# Patient Record
Sex: Female | Born: 1950
Health system: Southern US, Community
[De-identification: ages and names within clinical notes are randomized; demographics above are authoritative.]

## PROBLEM LIST (undated history)

## (undated) DIAGNOSIS — F329 Major depressive disorder, single episode, unspecified: Secondary | ICD-10-CM

## (undated) DIAGNOSIS — M810 Age-related osteoporosis without current pathological fracture: Secondary | ICD-10-CM

## (undated) DIAGNOSIS — E039 Hypothyroidism, unspecified: Secondary | ICD-10-CM

## (undated) DIAGNOSIS — K2100 Gastro-esophageal reflux disease with esophagitis, without bleeding: Secondary | ICD-10-CM

## (undated) DIAGNOSIS — N8111 Cystocele, midline: Secondary | ICD-10-CM

## (undated) DIAGNOSIS — F3289 Other specified depressive episodes: Secondary | ICD-10-CM

## (undated) DIAGNOSIS — G4700 Insomnia, unspecified: Secondary | ICD-10-CM

## (undated) DIAGNOSIS — K2270 Barrett's esophagus without dysplasia: Secondary | ICD-10-CM

## (undated) DIAGNOSIS — F411 Generalized anxiety disorder: Secondary | ICD-10-CM

## (undated) DIAGNOSIS — K120 Recurrent oral aphthae: Secondary | ICD-10-CM

## (undated) DIAGNOSIS — G4733 Obstructive sleep apnea (adult) (pediatric): Secondary | ICD-10-CM

## (undated) DIAGNOSIS — B159 Hepatitis A without hepatic coma: Secondary | ICD-10-CM

## (undated) DIAGNOSIS — A64 Unspecified sexually transmitted disease: Secondary | ICD-10-CM

## (undated) DIAGNOSIS — I10 Essential (primary) hypertension: Secondary | ICD-10-CM

## (undated) DIAGNOSIS — R81 Glycosuria: Secondary | ICD-10-CM

## (undated) DIAGNOSIS — R809 Proteinuria, unspecified: Secondary | ICD-10-CM

## (undated) DIAGNOSIS — L719 Rosacea, unspecified: Secondary | ICD-10-CM

## (undated) DIAGNOSIS — E785 Hyperlipidemia, unspecified: Secondary | ICD-10-CM

## (undated) DIAGNOSIS — Z79899 Other long term (current) drug therapy: Secondary | ICD-10-CM

## (undated) HISTORY — PX: ABDOMINOPLASTY: SUR9

## (undated) HISTORY — DX: Hypothyroidism, unspecified: E03.9

## (undated) HISTORY — DX: Barrett's esophagus without dysplasia: K22.70

## (undated) HISTORY — DX: Insomnia, unspecified: G47.00

## (undated) HISTORY — DX: Hyperlipidemia, unspecified: E78.5

## (undated) HISTORY — PX: OTHER SURGICAL HISTORY: SHX169

## (undated) HISTORY — DX: Generalized anxiety disorder: F41.1

## (undated) HISTORY — DX: Unspecified sexually transmitted disease: A64

## (undated) HISTORY — DX: Recurrent oral aphthae: K12.0

## (undated) HISTORY — DX: Other specified depressive episodes: F32.89

## (undated) HISTORY — DX: Glycosuria: R81

## (undated) HISTORY — DX: Hepatitis a without hepatic coma: B15.9

## (undated) HISTORY — DX: Obstructive sleep apnea (adult) (pediatric): G47.33

## (undated) HISTORY — DX: Essential (primary) hypertension: I10

## (undated) HISTORY — DX: Gastro-esophageal reflux disease with esophagitis: K21.0

## (undated) HISTORY — DX: Rosacea, unspecified: L71.9

## (undated) HISTORY — DX: Cystocele, midline: N81.11

## (undated) HISTORY — DX: Gastro-esophageal reflux disease with esophagitis, without bleeding: K21.00

## (undated) HISTORY — DX: Age-related osteoporosis without current pathological fracture: M81.0

## (undated) HISTORY — DX: Proteinuria, unspecified: R80.9

## (undated) HISTORY — DX: Major depressive disorder, single episode, unspecified: F32.9

## (undated) HISTORY — DX: Other long term (current) drug therapy: Z79.899

## (undated) HISTORY — PX: CATARACT EXTRACTION: SUR2

---

## 2003-06-01 ENCOUNTER — Ambulatory Visit (HOSPITAL_COMMUNITY): Admission: RE | Admit: 2003-06-01 | Discharge: 2003-06-01 | Payer: Self-pay | Admitting: *Deleted

## 2003-06-01 ENCOUNTER — Encounter (INDEPENDENT_AMBULATORY_CARE_PROVIDER_SITE_OTHER): Payer: Self-pay | Admitting: Specialist

## 2004-10-01 ENCOUNTER — Encounter (INDEPENDENT_AMBULATORY_CARE_PROVIDER_SITE_OTHER): Payer: Self-pay | Admitting: *Deleted

## 2004-10-01 ENCOUNTER — Ambulatory Visit (HOSPITAL_COMMUNITY): Admission: RE | Admit: 2004-10-01 | Discharge: 2004-10-01 | Payer: Self-pay | Admitting: *Deleted

## 2006-12-25 ENCOUNTER — Ambulatory Visit (HOSPITAL_COMMUNITY): Admission: RE | Admit: 2006-12-25 | Discharge: 2006-12-25 | Payer: Self-pay | Admitting: *Deleted

## 2006-12-25 ENCOUNTER — Encounter (INDEPENDENT_AMBULATORY_CARE_PROVIDER_SITE_OTHER): Payer: Self-pay | Admitting: Specialist

## 2007-12-30 HISTORY — PX: COSMETIC SURGERY: SHX468

## 2008-06-22 ENCOUNTER — Other Ambulatory Visit: Admission: RE | Admit: 2008-06-22 | Discharge: 2008-06-22 | Payer: Self-pay | Admitting: Obstetrics & Gynecology

## 2008-07-20 IMAGING — US MAMMO-RUNI-US
1 series · 5 of 5 positions shown · non-contrast
Comparison: NONE

CLINICAL DATA: J Louis Dugue, RT(R)(M)   Diagnostic 
Mammogram.  

RIGHT BREAST MAMMOGRAM ADDITIONAL VIEWS AND RIGHT BREAST 
ULTRASOUND

[Series 1: us breast · 0.07mm/px · 5 of 5 slices shown]
[im 1/5]
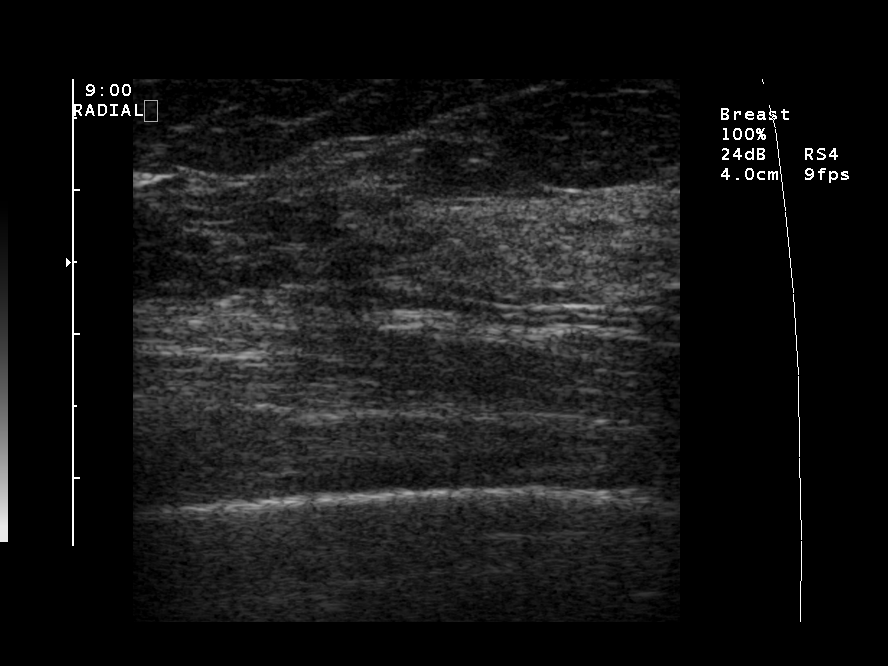
[im 2/5]
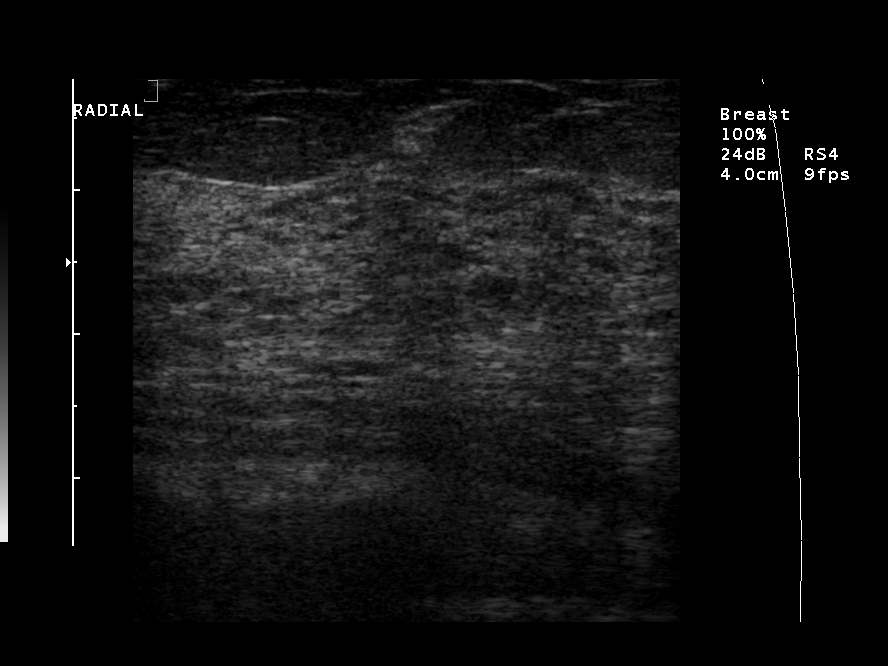
[im 3/5]
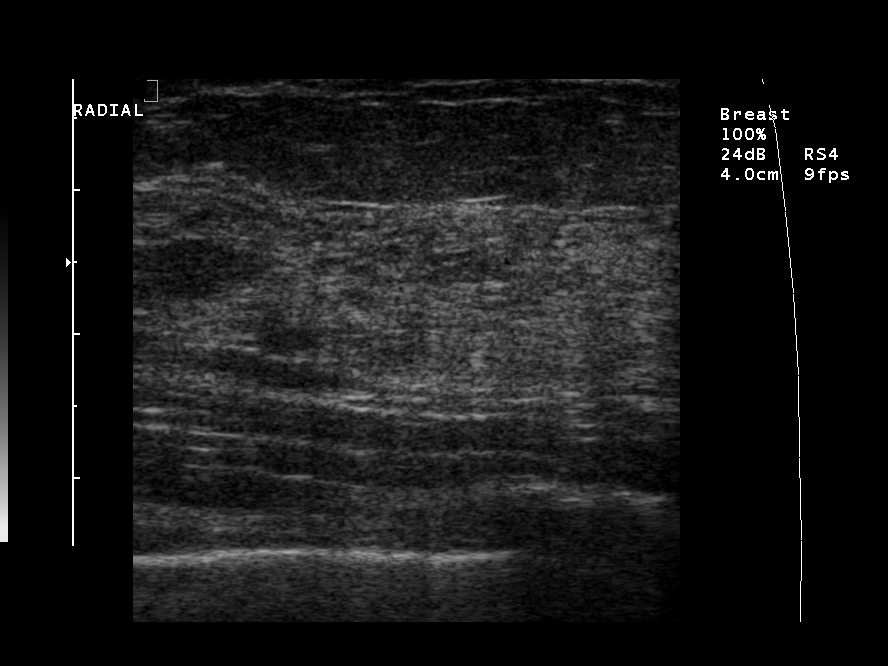
[im 4/5]
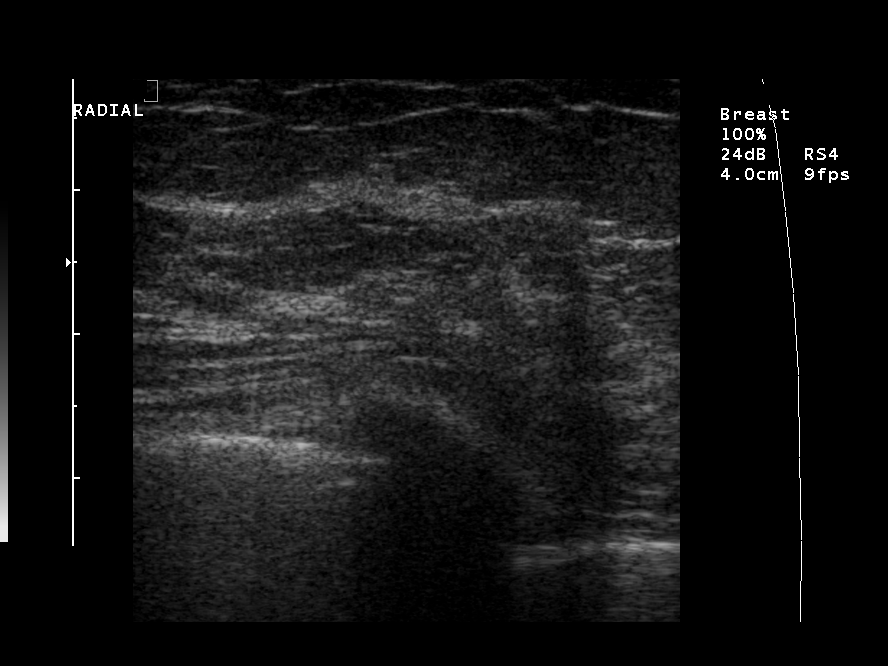
[im 5/5]
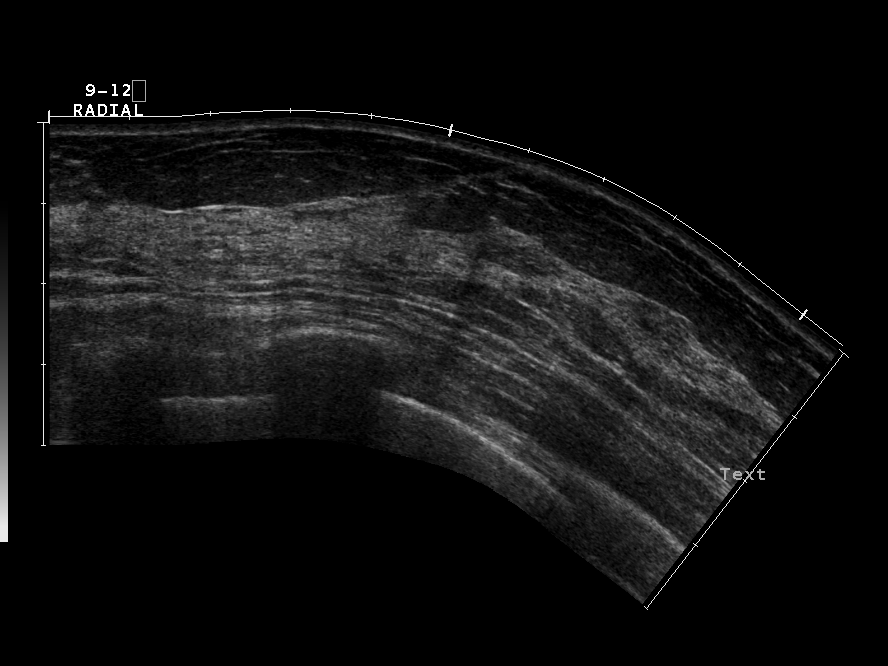

[5 of 5 positions shown; findings below may reference images not displayed]

FINDINGS: Exaggerated lateral right CC and repeat right CC view 
demonstrates fibroglandular asymmetry without definite 
architectural distortion, spiculation, or microcalcification.  
Ultrasound was performed for evaluation of area of fibroglandular 
asymmetry and demonstrates no suspicious findings.
IMPRESSION: No mammographic or ultrasonographic evidence of 
malignancy.  Bilateral mammogram in one year is recommended. The 
patient was informed at the time of the examination of the 
findings and recommendations by verbal and written lay report. 
Computer assisted (Second Look) technology was used as an aid in 
interpretation of this study. BI-RADS 2- Benign Mancera, Khristina Date: 12/28/2006 MOE GRACIANO

## 2008-12-29 HISTORY — PX: BREAST ENHANCEMENT SURGERY: SHX7

## 2009-04-20 IMAGING — US US-BREAST([ID])
1 series · 14 of 14 positions shown · non-contrast
Comparison: NONE

CLINICAL DATA: Dedekind, Malamba   Diagnostic Mammogram. 

BILATERAL MAMMOGRAM AND BILATERAL BREAST ULTRASOUND

[Series 1: us breast · 0.07mm/px · 14 of 14 slices shown]
[im 1/14]
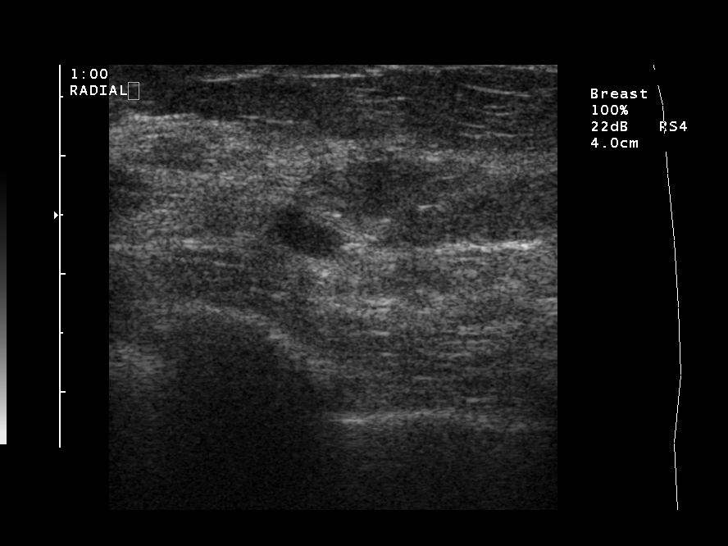
[im 2/14]
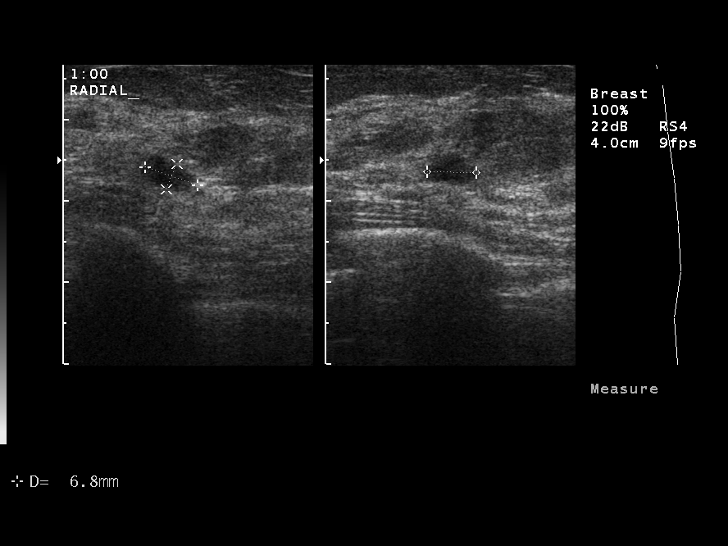
[im 3/14]
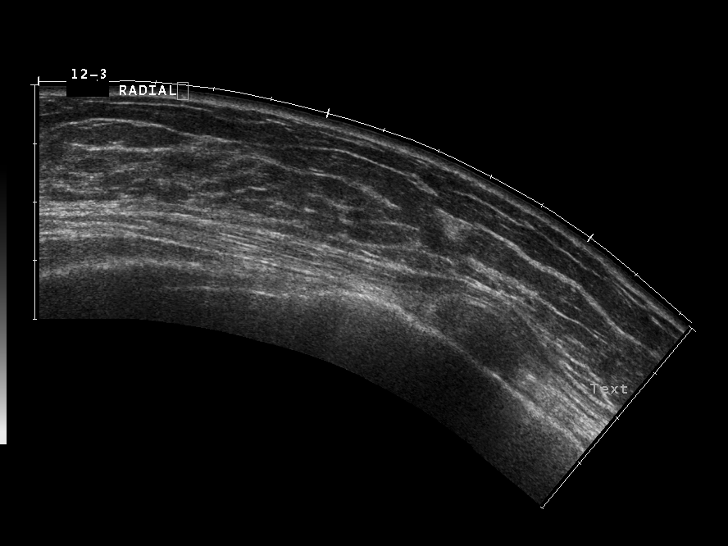
[im 4/14]
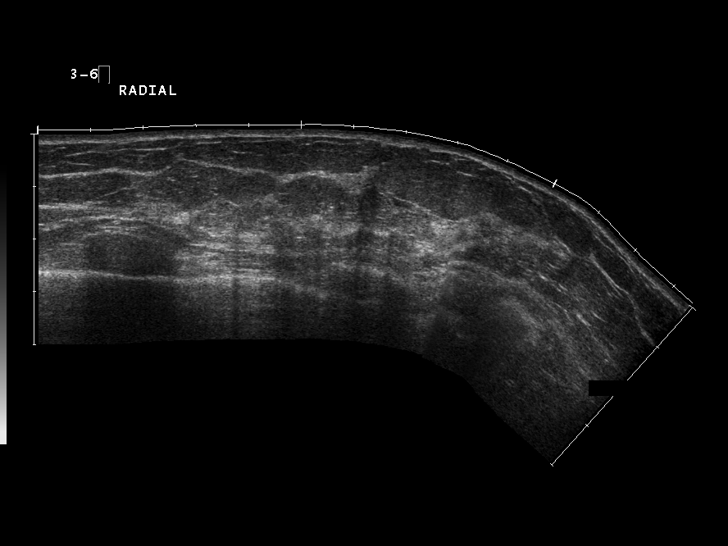
[im 5/14]
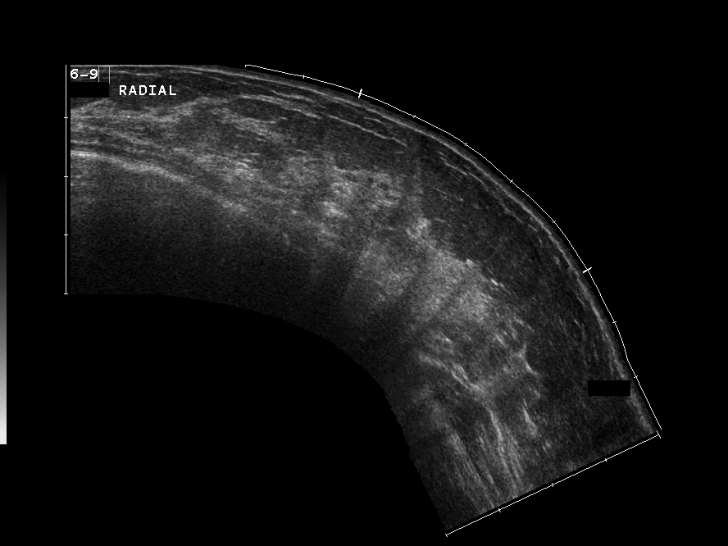
[im 6/14]
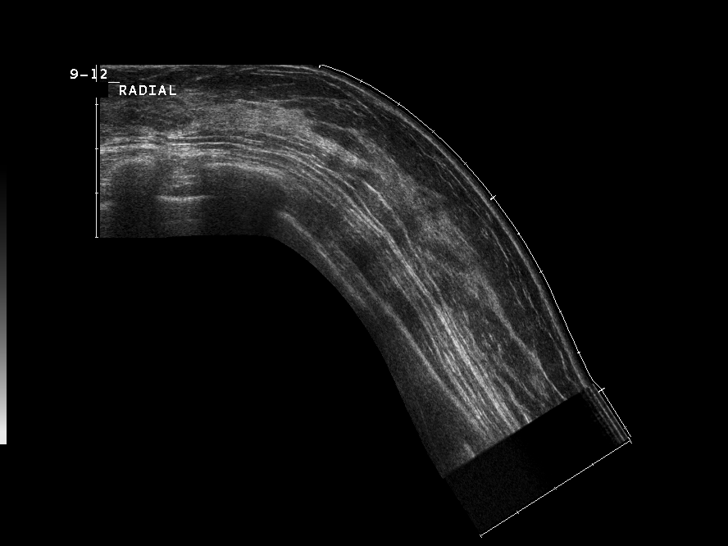
[im 7/14]
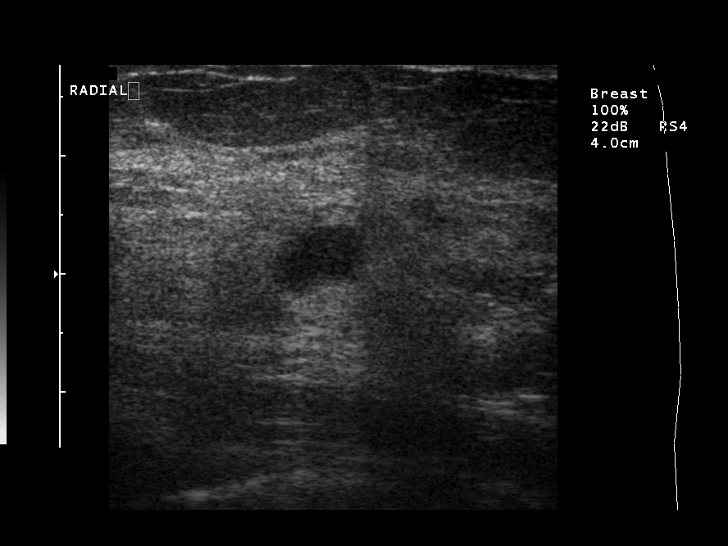
[im 8/14]
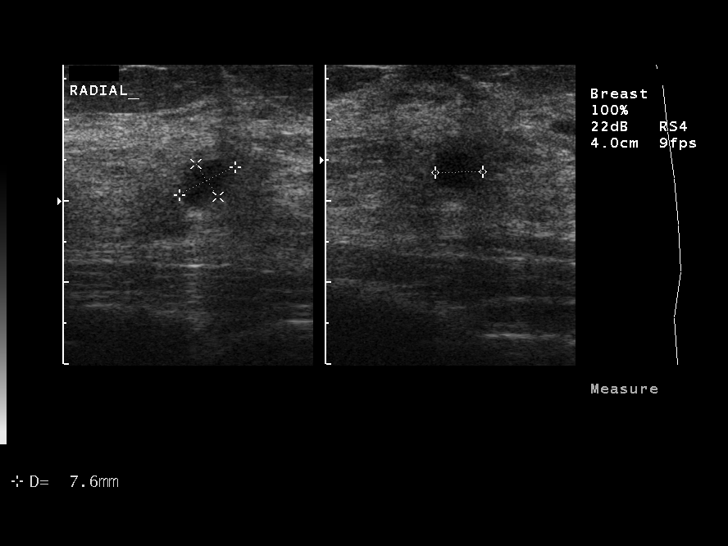
[im 9/14]
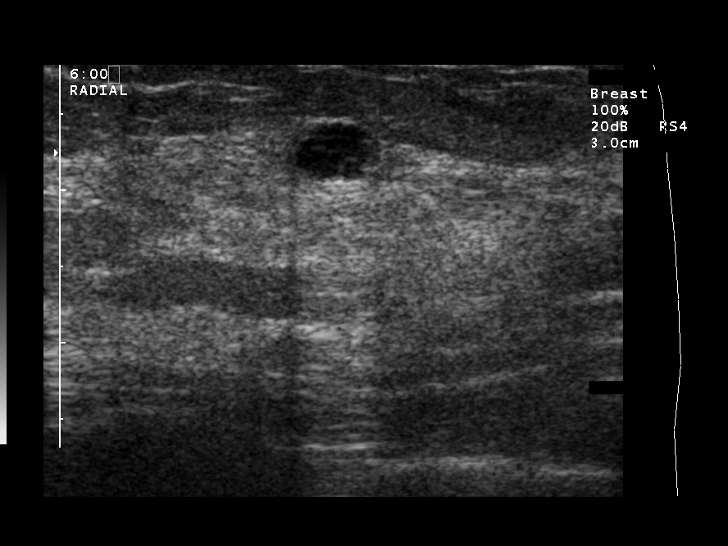
[im 10/14]
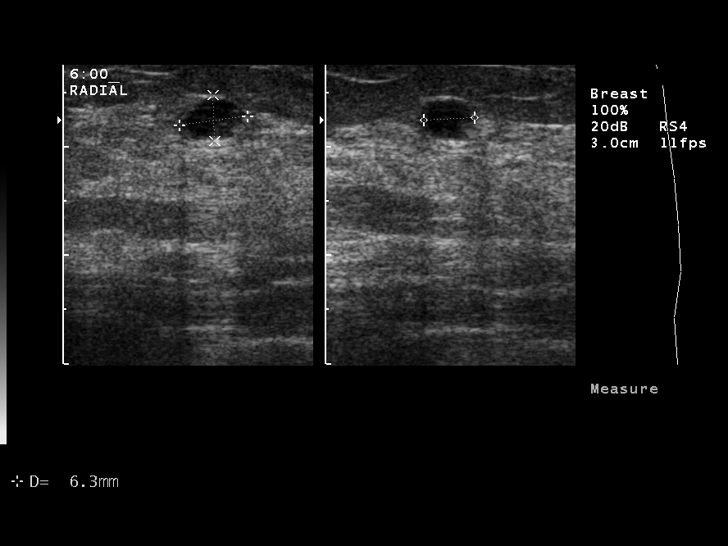
[im 11/14]
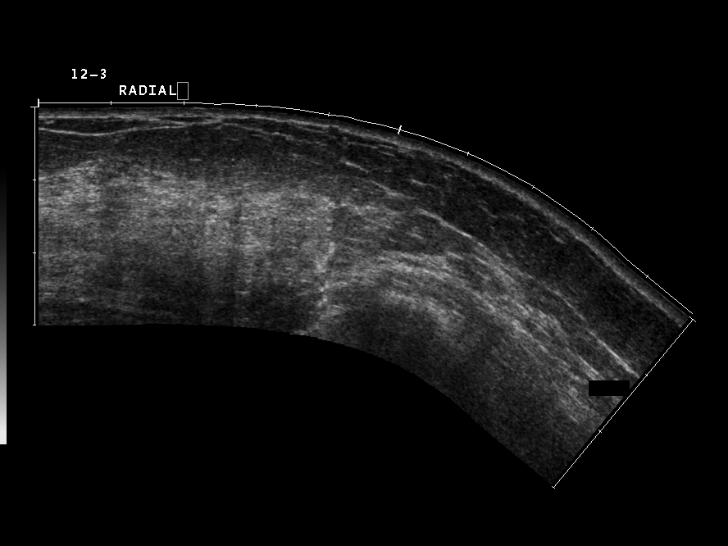
[im 12/14]
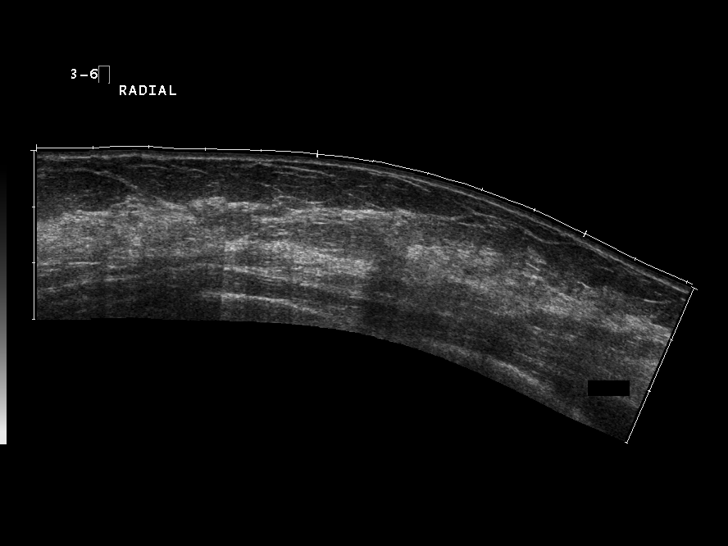
[im 13/14]
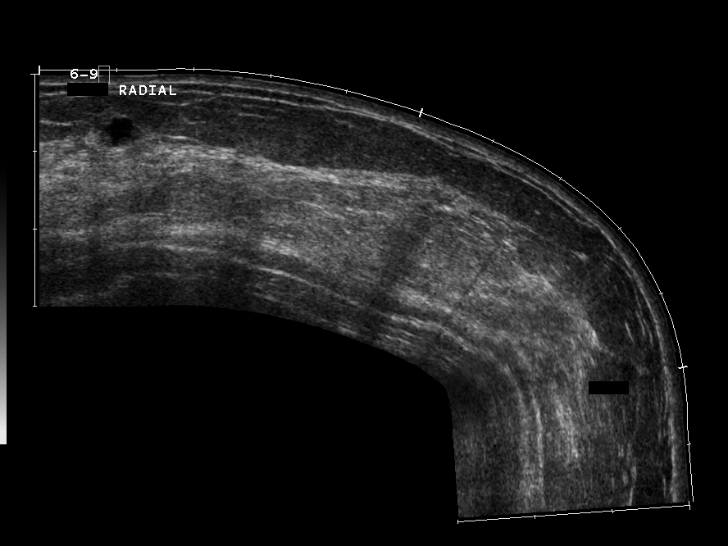
[im 14/14]
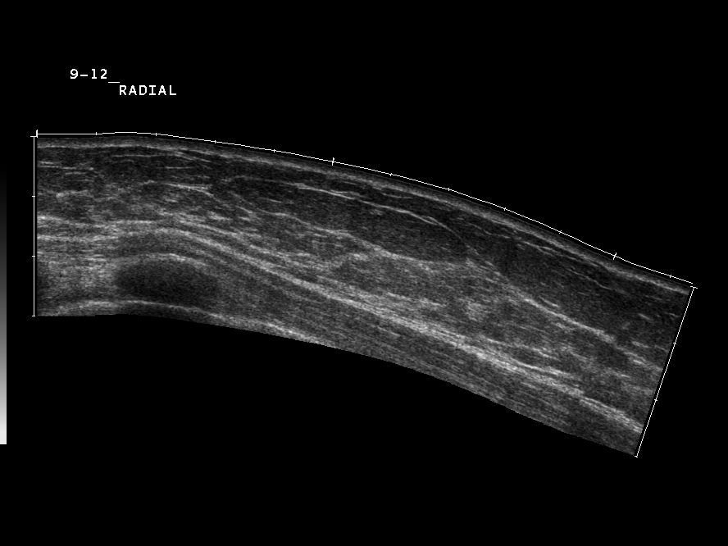

[14 of 14 positions shown; findings below may reference images not displayed]

FINDINGS: Tissue density is moderate.  Comparison with 11-11-05 
and 12-18-06 studies.  No dominant mass or microcalcification is 
identified to suggest malignancy.  The appearance is stable. 
Bilateral breast ultrasound was performed.  This is correlated 
with previous ultrasound dated 04-21-05.  At 6 o???clock in the left 
breast, there is a cyst basically unchanged since the previous 
exam.  At 12 o???clock there is a focal area of decreased 
echogenicity with sound transmission likely representing a benign 
lesion such as a cyst as well.  No masses characteristic of a 
carcinoma are identified.
IMPRESSION: Benign finding.  Recommend follow-up screening 
mammography in one year. The patient was informed at the time of 
the examination of the findings and recommendations by verbal and 
written lay report. Computer assisted (Second Look) technology was 
used as an aid in interpretation of this study. BI-RADS 2- Benign

## 2009-12-29 HISTORY — PX: GANGLION CYST EXCISION: SHX1691

## 2011-01-24 ENCOUNTER — Other Ambulatory Visit: Payer: Self-pay

## 2011-05-16 NOTE — Op Note (Signed)
   NAME:  Donna Ware, CONNERY                   ACCOUNT NO.:  [id number]   MEDICAL RECORD NO.:  [id number]                   PATIENT TYPE:  AMB   LOCATION:  ENDO                                 FACILITY:  [facility]   PHYSICIAN:  Georgiana Spinner, M.D.                 DATE OF BIRTH:  May 31, 1951   DATE OF PROCEDURE:  06/01/2003  DATE OF DISCHARGE:                                 OPERATIVE REPORT   PROCEDURE:  Upper endoscopy.   ENDOSCOPIST:  Georgiana Spinner, M.D.   INDICATIONS FOR PROCEDURE:  Gastroesophageal reflux disease.   ANESTHESIA:  Demerol 100 mg, Versed 10 mg.   DESCRIPTION OF PROCEDURE:  With the patient mildly sedated in the left  lateral decubitus position, the Olympus videoscopic endoscope was inserted  in the mouth and passed under direct vision through the esophagus and into  the stomach.  The fundus, body, antrum, duodenal bulb and second portion of  the duodenum all appeared normal.  From this point the endoscope was slowly  withdrawn, taking circumferential views of the duodenal mucosa.  The  endoscope was pulled back into the stomach and placed on retroflexion to  view the stomach from below.  The endoscope was then straightened and  withdrawn, taking circumferential views of the remaining gastric and  esophageal mucosa, stopping in the area of the distal esophagus, where what  appeared to be changes of Barrett's esophagus were seen, photographed and  biopsied.  The patient's vital signs and pulse oximeter remained stable.  The patient tolerated the procedure well without apparent complications.   FINDINGS:  Changes of what appears to be Barrett's esophagus distally in the  esophagus, biopsied.  Await the biopsy report.   DISPOSITION:  The patient will call me for the results and will follow up  with me as an outpatient.                                                Georgiana Spinner, M.D.    GMO/MEDQ  D:  06/01/2003  T:  06/01/2003  Job:  [id number]

## 2011-05-16 NOTE — Op Note (Signed)
NAMELILA, LUFKIN NO.:  [id number]   MEDICAL RECORD NO.:  [id number]          PATIENT TYPE:  AMB   LOCATION:  ENDO                         FACILITY:  [facility]   PHYSICIAN:  Georgiana Spinner, M.D.    DATE OF BIRTH:  09/11/1951   DATE OF PROCEDURE:  10/01/2004  DATE OF DISCHARGE:                                 OPERATIVE REPORT   PROCEDURE PERFORMED:  Upper endoscopy with biopsy.   ENDOSCOPIST:  Georgiana Spinner, M.D.   INDICATIONS FOR PROCEDURE:  Genella Rife.   ANESTHESIA:  Demerol 100 mg, Versed 10 mg.   DESCRIPTION OF PROCEDURE:  With the patient mildly sedated in the left  lateral decubitus position, the Olympus video endoscope was inserted in the  mouth and passed under direct vision through the esophagus which appeared  normal until we reached the distal esophagus and there were changes of  Barrett's photographed and biopsied.  We entered into the stomach.  In the  fundus there were some remnants of food which were photographed along with  the fundus, but body, antrum, duodenal bulb and second portion of the  duodenum all appeared normal.  From this point, the endoscope was slowly  withdrawn taking circumferential views of the duodenal mucosa until the  endoscope was pulled back into the stomach and placed on retroflexion to  view the stomach from below.  The endoscope was then straightened and  withdrawn taking circumferential views of the remaining gastric and  esophageal mucosa.  The patient's vital signs and pulse oximeter remained  stable.  The patient tolerated the procedure well without apparent  complications.   FINDINGS:  Barrett's esophagus.  Loose wrap of the gastroesophageal junction  around the endoscope, photographed.  Some residual food left in the stomach  indicating possible mild gastroparesis.   PLAN:  Await biopsy report.  The patient will call me for results and follow  up with me as an outpatient.       GMO/MEDQ  D:  10/01/2004  T:   10/01/2004  Job:  [id number]

## 2011-05-16 NOTE — Op Note (Signed)
NAMEJILLANN, CHARETTE NO.:  [id number]   MEDICAL RECORD NO.:  [id number]          PATIENT TYPE:  AMB   LOCATION:  ENDO                         FACILITY:  [facility]   PHYSICIAN:  Georgiana Spinner, M.D.    DATE OF BIRTH:  07-07-1951   DATE OF PROCEDURE:  12/25/2006  DATE OF DISCHARGE:                               OPERATIVE REPORT   PROCEDURE:  Upper endoscopy with biopsy.   INDICATIONS:  GERD.  Question of Barrett's esophagus.   ANESTHESIA:  Demerol 80 mg, Versed 8 mg, Phenergan 25 mg.   PROCEDURE:  With the patient mildly sedated in the left lateral  decubitus position the Pentax videoscopic endoscope was inserted in the  mouth and passed under direct vision through the esophagus which did not  fully open so we could not really get good view of the distal esophagus  to see if there was truly Barrett's endoscopically, but we elected to  biopsy around the circumference of the squamocolumnar junction.  We then  entered into the stomach. Fundus, body, antrum, duodenal bulb, second  portion duodenum were visualized. From this point the endoscope was  slowly withdrawn taking circumferential views of duodenal mucosa until  the endoscope had been pulled back into the stomach, placed in  retroflexion to view the stomach from below. The endoscope was  straightened and withdrawn taking circumferential views remaining  gastric and esophageal mucosa.  The patient's vital signs, pulse  oximeter remained stable.  The patient tolerated procedure well without  apparent complication.   FINDINGS:  Unremarkable examination with question of Barrett's esophagus  biopsied.  Await biopsy report.  The patient will call me for results  and follow-up with me as an outpatient.           ______________________________  Georgiana Spinner, M.D.     GMO/MEDQ  D:  12/25/2006  T:  12/25/2006  Job:  [id number]

## 2011-05-16 NOTE — Op Note (Signed)
   NAME:  Donna Ware, Donna Ware                   ACCOUNT NO.:  [id number]   MEDICAL RECORD NO.:  [id number]                   PATIENT TYPE:  AMB   LOCATION:  ENDO                                 FACILITY:  [facility]   PHYSICIAN:  Georgiana Spinner, M.D.                 DATE OF BIRTH:  1951-09-19   DATE OF PROCEDURE:  06/01/2003  DATE OF DISCHARGE:                                 OPERATIVE REPORT   PROCEDURE:  Colonoscopy.   ENDOSCOPIST:  Georgiana Spinner, M.D.   INDICATIONS:  Colon cancer screening, Hemoccult positivity.   ANESTHESIA:  None further given.   DESCRIPTION OF PROCEDURE:  With the patient mildly sedated in the left  lateral decubitus position, the Olympus videoscopic colonoscope was inserted  in the rectum and passed under direct vision to the cecum, identified by the  cecum and the ileocecal valve, both which were photographed.  From this  point, the colonoscope was slowly withdrawn taken circumferential views of  the entire colonic mucosa visualized, stopping in the rectum which appeared  normal on direct view and showed unremarkable on retroflexed view as well.  The endoscope was straightened and withdrawn.  The patient's vital signs and  pulse oximeter remained stable.  The patient tolerated the procedure well  without apparent complications.   FINDINGS:  Unremarkable colonoscopic examination to the cecum.   PLAN:  Possibly repeat examination in 5-10 years.                                               Georgiana Spinner, M.D.    GMO/MEDQ  D:  06/01/2003  T:  06/01/2003  Job:  [id number]   cc:   Lenon Curt. Chilton Si, M.D.  [address]  [place]  [place] [address]  Fax: [phone]

## 2013-06-08 ENCOUNTER — Encounter: Payer: Self-pay | Admitting: *Deleted

## 2013-06-09 ENCOUNTER — Ambulatory Visit (INDEPENDENT_AMBULATORY_CARE_PROVIDER_SITE_OTHER): Payer: Federal, State, Local not specified - PPO | Admitting: Internal Medicine

## 2013-06-09 ENCOUNTER — Other Ambulatory Visit: Payer: Self-pay | Admitting: *Deleted

## 2013-06-09 ENCOUNTER — Other Ambulatory Visit: Payer: Self-pay | Admitting: Internal Medicine

## 2013-06-09 ENCOUNTER — Encounter: Payer: Self-pay | Admitting: Internal Medicine

## 2013-06-09 VITALS — BP 128/82 | HR 63 | Temp 97.3°F | Resp 14 | Ht 65.0 in | Wt 123.4 lb

## 2013-06-09 DIAGNOSIS — G4733 Obstructive sleep apnea (adult) (pediatric): Secondary | ICD-10-CM | POA: Insufficient documentation

## 2013-06-09 DIAGNOSIS — F3289 Other specified depressive episodes: Secondary | ICD-10-CM | POA: Insufficient documentation

## 2013-06-09 DIAGNOSIS — F329 Major depressive disorder, single episode, unspecified: Secondary | ICD-10-CM

## 2013-06-09 DIAGNOSIS — G4700 Insomnia, unspecified: Secondary | ICD-10-CM | POA: Insufficient documentation

## 2013-06-09 DIAGNOSIS — Z0000 Encounter for general adult medical examination without abnormal findings: Secondary | ICD-10-CM | POA: Insufficient documentation

## 2013-06-09 DIAGNOSIS — K2270 Barrett's esophagus without dysplasia: Secondary | ICD-10-CM | POA: Insufficient documentation

## 2013-06-09 DIAGNOSIS — Z9189 Other specified personal risk factors, not elsewhere classified: Secondary | ICD-10-CM

## 2013-06-09 DIAGNOSIS — I10 Essential (primary) hypertension: Secondary | ICD-10-CM | POA: Insufficient documentation

## 2013-06-09 DIAGNOSIS — M810 Age-related osteoporosis without current pathological fracture: Secondary | ICD-10-CM

## 2013-06-09 DIAGNOSIS — K2100 Gastro-esophageal reflux disease with esophagitis, without bleeding: Secondary | ICD-10-CM

## 2013-06-09 DIAGNOSIS — Z202 Contact with and (suspected) exposure to infections with a predominantly sexual mode of transmission: Secondary | ICD-10-CM | POA: Insufficient documentation

## 2013-06-09 DIAGNOSIS — E039 Hypothyroidism, unspecified: Secondary | ICD-10-CM

## 2013-06-09 DIAGNOSIS — E785 Hyperlipidemia, unspecified: Secondary | ICD-10-CM | POA: Insufficient documentation

## 2013-06-09 MED ORDER — OMEPRAZOLE 20 MG PO CPDR: DELAYED_RELEASE_CAPSULE | ORAL | Status: DC

## 2013-06-09 MED ORDER — THYROID 60 MG PO TABS: ORAL_TABLET | ORAL | Status: DC

## 2013-06-09 MED ORDER — CLONAZEPAM 1 MG PO TABS: ORAL_TABLET | ORAL | Status: DC

## 2013-06-09 MED ORDER — ZOLPIDEM TARTRATE 10 MG PO TABS: 10.0000 mg | ORAL_TABLET | Freq: Every evening | ORAL | Status: DC | PRN

## 2013-06-09 MED ORDER — FLUOXETINE HCL 10 MG PO CAPS: ORAL_CAPSULE | ORAL | Status: DC

## 2013-06-09 NOTE — Assessment & Plan Note (Signed)
Annual pap smear, pelvic, rectal and breast exam performed.  Referrals done for mammogram and bone density at [facility] where she previously has had them.
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
## 2013-06-09 NOTE — Assessment & Plan Note (Signed)
Stable

## 2013-06-09 NOTE — Assessment & Plan Note (Signed)
Check FLP today.  Not on meds for this.

## 2013-06-09 NOTE — Assessment & Plan Note (Signed)
Mood is amazingly good considering her circumstances.  Has h/o bulimia.  Weight appears normal at present.  Does have some nail changes that suggest vitamin deficiency.  I have ordered her vitamin D level today, but others could be responsible, and could be related to her prior difficulties or her avoidance of high vitamin C products due to her acid reflux.

## 2013-06-09 NOTE — Assessment & Plan Note (Signed)
Follows with Dr. Virginia Rochester.

## 2013-06-09 NOTE — Assessment & Plan Note (Signed)
BP is at goal. 

## 2013-06-09 NOTE — Patient Instructions (Addendum)
Please use condoms with any sexual encounters.  We will call you with results of your testing.

## 2013-06-09 NOTE — Assessment & Plan Note (Signed)
Stable, avoids acidic foods.

## 2013-06-09 NOTE — Assessment & Plan Note (Signed)
Check TSH today and continue armour thyroid for now.  I am unsure if endocrine started this or if Dr. Chilton Si had prescribed it.

## 2013-06-09 NOTE — Assessment & Plan Note (Signed)
Is due for her bone density at [facility].  Referral done today.  She is on calcium with vitamin D.  She has Barrett's esophagus so bisphosphonates would be contraindicated.  I am unsure if she has received other treatment for this and we will need to address this at her next regular visit.  May benefit from IV bisphosphonate or prolia.
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
## 2013-06-09 NOTE — Assessment & Plan Note (Signed)
Stable at this time 

## 2013-06-11 LAB — COMPREHENSIVE METABOLIC PANEL
ALT: 15 IU/L (ref 0–32)
AST: 19 IU/L (ref 0–40)
Albumin/Globulin Ratio: 1.8 (ref 1.1–2.5)
Albumin: 4.6 g/dL (ref 3.6–4.8)
Alkaline Phosphatase: 66 IU/L (ref 39–117)
BUN/Creatinine Ratio: 21 (ref 11–26)
BUN: 15 mg/dL (ref 8–27)
CO2: 27 mmol/L (ref 19–28)
Calcium: 9.4 mg/dL (ref 8.6–10.2)
Chloride: 103 mmol/L (ref 97–108)
Creatinine, Ser: 0.71 mg/dL (ref 0.57–1.00)
GFR calc Af Amer: 106 mL/min/{1.73_m2} (ref >59)
GFR calc non Af Amer: 92 mL/min/{1.73_m2} (ref >59)
Globulin, Total: 2.6 g/dL (ref 1.5–4.5)
Glucose: 98 mg/dL (ref 65–99)
Potassium: 4 mmol/L (ref 3.5–5.2)
Sodium: 141 mmol/L (ref 134–144)
Total Bilirubin: 0.8 mg/dL (ref 0.0–1.2)
Total Protein: 7.2 g/dL (ref 6.0–8.5)

## 2013-06-11 LAB — CBC WITH DIFFERENTIAL/PLATELET
Basophils Absolute: 0 10*3/uL (ref 0.0–0.2)
Basos: 1 % (ref 0–3)
Eos: 1 % (ref 0–5)
Eosinophils Absolute: 0 10*3/uL (ref 0.0–0.4)
HCT: 38.9 % (ref 34.0–46.6)
Hemoglobin: 13.6 g/dL (ref 11.1–15.9)
Immature Grans (Abs): 0 10*3/uL (ref 0.0–0.1)
Immature Granulocytes: 0 % (ref 0–2)
Lymphocytes Absolute: 1 10*3/uL (ref 0.7–3.1)
Lymphs: 33 % (ref 14–46)
MCH: 32.2 pg (ref 26.6–33.0)
MCHC: 35 g/dL (ref 31.5–35.7)
MCV: 92 fL (ref 79–97)
Monocytes Absolute: 0.4 10*3/uL (ref 0.1–0.9)
Monocytes: 14 % — ABNORMAL HIGH (ref 4–12)
Neutrophils Absolute: 1.6 10*3/uL (ref 1.4–7.0)
Neutrophils Relative %: 51 % (ref 40–74)
RBC: 4.22 x10E6/uL (ref 3.77–5.28)
RDW: 13.1 % (ref 12.3–15.4)
WBC: 3.1 10*3/uL — ABNORMAL LOW (ref 3.4–10.8)

## 2013-06-11 LAB — LIPID PANEL WITH LDL/HDL RATIO
Cholesterol, Total: 228 mg/dL — ABNORMAL HIGH (ref 100–199)
HDL: 78 mg/dL (ref >39)
LDL Calculated: 139 mg/dL — ABNORMAL HIGH (ref 0–99)
LDl/HDL Ratio: 1.8 ratio units (ref 0.0–3.2)
Triglycerides: 55 mg/dL (ref 0–149)
VLDL Cholesterol Cal: 11 mg/dL (ref 5–40)

## 2013-06-11 LAB — GC/CHLAMYDIA PROBE AMP
Chlamydia trachomatis, NAA: NEGATIVE
Neisseria gonorrhoeae by PCR: NEGATIVE

## 2013-06-11 LAB — HSV(HERPES SIMPLEX VRS) I + II AB-IGG
HAV 1 IGG,TYPE SPECIFIC AB: <0.91 index (ref 0.00–0.90)
HSV 2 IGG,TYPE SPECIFIC AB: 2.64 index — ABNORMAL HIGH (ref 0.00–0.90)

## 2013-06-11 LAB — HEPATITIS PANEL, ACUTE
Hep A IgM: NEGATIVE
Hep B C IgM: NEGATIVE
Hep C Virus Ab: 0.7 s/co ratio (ref 0.0–0.9)
Hepatitis B Surface Ag: NEGATIVE

## 2013-06-11 LAB — TSH: TSH: 2.6 u[IU]/mL (ref 0.450–4.500)

## 2013-06-11 LAB — VITAMIN D 25 HYDROXY (VIT D DEFICIENCY, FRACTURES): Vit D, 25-Hydroxy: 27.5 ng/mL — ABNORMAL LOW (ref 30.0–100.0)

## 2013-06-16 ENCOUNTER — Other Ambulatory Visit: Payer: Self-pay | Admitting: Geriatric Medicine

## 2013-06-16 MED ORDER — ZOLPIDEM TARTRATE 10 MG PO TABS: 10.0000 mg | ORAL_TABLET | Freq: Every evening | ORAL | Status: DC | PRN

## 2013-07-13 ENCOUNTER — Other Ambulatory Visit: Payer: Self-pay | Admitting: Internal Medicine

## 2013-07-19 ENCOUNTER — Other Ambulatory Visit: Payer: Self-pay | Admitting: Geriatric Medicine

## 2013-07-19 MED ORDER — CLONAZEPAM 1 MG PO TABS: ORAL_TABLET | ORAL | Status: DC

## 2013-07-25 ENCOUNTER — Other Ambulatory Visit: Payer: Self-pay | Admitting: Geriatric Medicine

## 2013-07-25 MED ORDER — ZOLPIDEM TARTRATE 10 MG PO TABS: 10.0000 mg | ORAL_TABLET | Freq: Every evening | ORAL | Status: DC | PRN

## 2013-07-28 ENCOUNTER — Ambulatory Visit: Payer: Self-pay | Admitting: Certified Nurse Midwife

## 2013-07-28 LAB — PAP IG (IMAGE GUIDED): PAP Smear Comment: 0

## 2013-07-30 LAB — SPECIMEN STATUS REPORT

## 2013-08-13 ENCOUNTER — Other Ambulatory Visit: Payer: Self-pay | Admitting: Nurse Practitioner

## 2013-08-19 ENCOUNTER — Encounter: Payer: Self-pay | Admitting: Geriatric Medicine

## 2013-09-01 ENCOUNTER — Telehealth: Payer: Self-pay

## 2013-09-01 NOTE — Telephone Encounter (Signed)
Spoke with pharmacist at [facility] [place], [place]. Patient has refills on file, PA is required per insurance company. PA request was faxed on in August 2014. I requested PA to be re-faxed to [phone]

## 2013-09-01 NOTE — Telephone Encounter (Signed)
Message left on triage voicemail: patient is going through a divorce and is temporarily living in [place]. Patient will move back her in October. [facility] [place], [place]   Last OV 06/09/2013, last filled 07/25/2012 #30/3 refills. Left message on voicemail for pharmacy to call and verify if rx is on file from 07/25/2013.

## 2013-09-02 NOTE — Telephone Encounter (Signed)
No PA paperwork received as of 09/02/2013.  I called and left message on voicemail for pharmacy requesting paperwork be submitted for PA to our office, I requested that PA paperwork be sent to my attention

## 2013-09-02 NOTE — Telephone Encounter (Signed)
Mychart message sent to patient to explain status on refill request for ambien, patient instructed to further follow-up with pharmacy for I have contacted them on several times and still no PA paperwork received.

## 2013-09-12 ENCOUNTER — Other Ambulatory Visit: Payer: Self-pay | Admitting: Nurse Practitioner

## 2013-09-12 NOTE — Telephone Encounter (Signed)
Patient cancelled AEX 07/28/13 please advise no AEX currently scheduled.

## 2013-09-13 ENCOUNTER — Other Ambulatory Visit: Payer: Self-pay | Admitting: *Deleted

## 2013-09-13 MED ORDER — CLONAZEPAM 1 MG PO TABS: ORAL_TABLET | ORAL | Status: DC

## 2013-09-20 ENCOUNTER — Other Ambulatory Visit: Payer: Self-pay | Admitting: Nurse Practitioner

## 2013-09-21 NOTE — Telephone Encounter (Signed)
Called pharmacy x 2, line busy. I will try to reach pharmacy again later. RX was filled on 09/13/2013 #120/1 refill-? If received by pharmacy

## 2013-09-21 NOTE — Telephone Encounter (Signed)
eScribe request for refill on ESTRACE VAG CREAM Last filled - 09/15/13 Last AEX - 07/27/12 Next AEX - not scheduled Pt will given one refill on 09/15/13.  RX denied.

## 2013-09-21 NOTE — Telephone Encounter (Signed)
Pharmacy line still busy

## 2013-09-22 NOTE — Telephone Encounter (Signed)
Pharmacy line is still busy. I will submit another rx with the notation rx was approved on 09/13/13, if rx is not on file please fill at this time

## 2013-09-26 ENCOUNTER — Other Ambulatory Visit: Payer: Self-pay | Admitting: Nurse Practitioner

## 2013-09-29 ENCOUNTER — Other Ambulatory Visit: Payer: Self-pay | Admitting: *Deleted

## 2013-10-06 ENCOUNTER — Other Ambulatory Visit: Payer: Self-pay | Admitting: *Deleted

## 2013-10-06 MED ORDER — THYROID 60 MG PO TABS: ORAL_TABLET | ORAL | Status: DC

## 2013-10-06 MED ORDER — CLONAZEPAM 1 MG PO TABS: ORAL_TABLET | ORAL | Status: DC

## 2013-10-06 MED ORDER — OMEPRAZOLE 20 MG PO CPDR: DELAYED_RELEASE_CAPSULE | ORAL | Status: DC

## 2013-10-06 MED ORDER — FLUOXETINE HCL 10 MG PO CAPS: ORAL_CAPSULE | ORAL | Status: DC

## 2013-10-06 MED ORDER — ESTRADIOL 0.1 MG/GM VA CREA: TOPICAL_CREAM | VAGINAL | Status: DC

## 2013-11-03 ENCOUNTER — Other Ambulatory Visit: Payer: Self-pay

## 2013-11-10 ENCOUNTER — Other Ambulatory Visit: Payer: Self-pay | Admitting: *Deleted

## 2013-11-10 MED ORDER — ZOLPIDEM TARTRATE 10 MG PO TABS: 10.0000 mg | ORAL_TABLET | Freq: Every evening | ORAL | Status: DC | PRN

## 2014-02-09 ENCOUNTER — Other Ambulatory Visit: Payer: Self-pay | Admitting: Nurse Practitioner

## 2014-02-09 MED ORDER — CLONAZEPAM 1 MG PO TABS: ORAL_TABLET | ORAL | Status: DC

## 2014-03-16 ENCOUNTER — Other Ambulatory Visit: Payer: Self-pay | Admitting: Nurse Practitioner

## 2014-03-16 MED ORDER — ZOLPIDEM TARTRATE 10 MG PO TABS: 10.0000 mg | ORAL_TABLET | Freq: Every evening | ORAL | Status: DC | PRN

## 2014-03-16 NOTE — Telephone Encounter (Signed)
[facility]

## 2014-05-17 ENCOUNTER — Other Ambulatory Visit: Payer: Self-pay | Admitting: *Deleted

## 2014-05-17 MED ORDER — ZOLPIDEM TARTRATE 10 MG PO TABS: ORAL_TABLET | ORAL | Status: DC

## 2014-05-17 NOTE — Telephone Encounter (Signed)
[facility]  

## 2014-06-05 ENCOUNTER — Other Ambulatory Visit: Payer: Self-pay | Admitting: *Deleted

## 2014-06-05 DIAGNOSIS — Z0000 Encounter for general adult medical examination without abnormal findings: Secondary | ICD-10-CM

## 2014-06-05 DIAGNOSIS — E039 Hypothyroidism, unspecified: Secondary | ICD-10-CM

## 2014-06-06 ENCOUNTER — Other Ambulatory Visit: Payer: Federal, State, Local not specified - PPO

## 2014-06-06 DIAGNOSIS — E039 Hypothyroidism, unspecified: Secondary | ICD-10-CM

## 2014-06-06 DIAGNOSIS — Z0000 Encounter for general adult medical examination without abnormal findings: Secondary | ICD-10-CM

## 2014-06-07 ENCOUNTER — Other Ambulatory Visit: Payer: Federal, State, Local not specified - PPO

## 2014-06-07 LAB — COMPREHENSIVE METABOLIC PANEL
ALT: 10 IU/L (ref 0–32)
AST: 16 IU/L (ref 0–40)
Albumin/Globulin Ratio: 1.7 (ref 1.1–2.5)
Albumin: 4.1 g/dL (ref 3.6–4.8)
Alkaline Phosphatase: 62 IU/L (ref 39–117)
BUN/Creatinine Ratio: 18 (ref 11–26)
BUN: 12 mg/dL (ref 8–27)
CO2: 25 mmol/L (ref 18–29)
Calcium: 9.2 mg/dL (ref 8.7–10.3)
Chloride: 98 mmol/L (ref 97–108)
Creatinine, Ser: 0.66 mg/dL (ref 0.57–1.00)
GFR calc Af Amer: 109 mL/min/{1.73_m2} (ref >59)
GFR calc non Af Amer: 95 mL/min/{1.73_m2} (ref >59)
Globulin, Total: 2.4 g/dL (ref 1.5–4.5)
Glucose: 91 mg/dL (ref 65–99)
Potassium: 4.4 mmol/L (ref 3.5–5.2)
Sodium: 136 mmol/L (ref 134–144)
Total Bilirubin: 0.4 mg/dL (ref 0.0–1.2)
Total Protein: 6.5 g/dL (ref 6.0–8.5)

## 2014-06-07 LAB — URINALYSIS
Bilirubin, UA: NEGATIVE
Glucose, UA: NEGATIVE
Ketones, UA: NEGATIVE
Nitrite, UA: NEGATIVE
Specific Gravity, UA: 1.015 (ref 1.005–1.030)
Urobilinogen, Ur: 0.2 mg/dL (ref 0.0–1.9)
pH, UA: 6.5 (ref 5.0–7.5)

## 2014-06-07 LAB — LIPID PANEL
Chol/HDL Ratio: 2.9 ratio units (ref 0.0–4.4)
Cholesterol, Total: 200 mg/dL — ABNORMAL HIGH (ref 100–199)
HDL: 69 mg/dL (ref >39)
LDL Calculated: 115 mg/dL — ABNORMAL HIGH (ref 0–99)
Triglycerides: 79 mg/dL (ref 0–149)
VLDL Cholesterol Cal: 16 mg/dL (ref 5–40)

## 2014-06-07 LAB — CBC WITH DIFFERENTIAL/PLATELET
Basophils Absolute: 0 10*3/uL (ref 0.0–0.2)
Basos: 0 %
Eos: 1 %
Eosinophils Absolute: 0 10*3/uL (ref 0.0–0.4)
HCT: 39.7 % (ref 34.0–46.6)
Hemoglobin: 13.4 g/dL (ref 11.1–15.9)
Immature Grans (Abs): 0 10*3/uL (ref 0.0–0.1)
Immature Granulocytes: 0 %
Lymphocytes Absolute: 0.8 10*3/uL (ref 0.7–3.1)
Lymphs: 16 %
MCH: 32.9 pg (ref 26.6–33.0)
MCHC: 33.8 g/dL (ref 31.5–35.7)
MCV: 98 fL — ABNORMAL HIGH (ref 79–97)
Monocytes Absolute: 0.6 10*3/uL (ref 0.1–0.9)
Monocytes: 12 %
Neutrophils Absolute: 3.6 10*3/uL (ref 1.4–7.0)
Neutrophils Relative %: 71 %
RBC: 4.07 x10E6/uL (ref 3.77–5.28)
RDW: 12.3 % (ref 12.3–15.4)
WBC: 5.1 10*3/uL (ref 3.4–10.8)

## 2014-06-07 LAB — TSH: TSH: 1.68 u[IU]/mL (ref 0.450–4.500)

## 2014-06-08 LAB — URINE CULTURE

## 2014-06-09 ENCOUNTER — Ambulatory Visit (INDEPENDENT_AMBULATORY_CARE_PROVIDER_SITE_OTHER): Payer: Federal, State, Local not specified - PPO | Admitting: Internal Medicine

## 2014-06-09 ENCOUNTER — Encounter: Payer: Self-pay | Admitting: Internal Medicine

## 2014-06-09 VITALS — BP 128/72 | HR 87 | Temp 97.2°F | Resp 18 | Ht 65.0 in | Wt 126.4 lb

## 2014-06-09 DIAGNOSIS — E039 Hypothyroidism, unspecified: Secondary | ICD-10-CM

## 2014-06-09 DIAGNOSIS — N390 Urinary tract infection, site not specified: Secondary | ICD-10-CM

## 2014-06-09 DIAGNOSIS — M810 Age-related osteoporosis without current pathological fracture: Secondary | ICD-10-CM

## 2014-06-09 DIAGNOSIS — Z01419 Encounter for gynecological examination (general) (routine) without abnormal findings: Secondary | ICD-10-CM

## 2014-06-09 DIAGNOSIS — Z202 Contact with and (suspected) exposure to infections with a predominantly sexual mode of transmission: Secondary | ICD-10-CM

## 2014-06-09 DIAGNOSIS — K2270 Barrett's esophagus without dysplasia: Secondary | ICD-10-CM

## 2014-06-09 DIAGNOSIS — A498 Other bacterial infections of unspecified site: Secondary | ICD-10-CM

## 2014-06-09 DIAGNOSIS — B9620 Unspecified Escherichia coli [E. coli] as the cause of diseases classified elsewhere: Secondary | ICD-10-CM

## 2014-06-09 MED ORDER — AMPICILLIN 250 MG PO CAPS: 250.0000 mg | ORAL_CAPSULE | Freq: Four times a day (QID) | ORAL | Status: DC

## 2014-06-09 NOTE — Progress Notes (Signed)
Patient ID: Donna Ware, female   DOB: [birth year], 63 y.o.   MRN: [id number]   Location:  [facility]   Allergies  Allergen Reactions  . Adhesive [Tape]   . Sulfa Antibiotics     Chief Complaint  Patient presents with  . Annual Exam    HPI: Patient is a 63 y.o. white female seen in the office today for her annual physical.  Feels like straw is tickling her urethra.    Hepatitis panel was negative.    Tetanus need to check misys  mammo--did not get last year due to personal problems so needs reordered  cscope is due for this.  Also has Barrett's and needs f/u egd. Medoff.    Zoster--has not had it  Has UTI.    Review of Systems:  Review of Systems  Constitutional: Negative for fever, weight loss and malaise/fatigue.  HENT: Negative for hearing loss.   Eyes: Negative for blurred vision.  Respiratory: Negative for cough and shortness of breath.   Cardiovascular: Negative for chest pain, palpitations and leg swelling.  Gastrointestinal: Negative for heartburn, abdominal pain, diarrhea, constipation, blood in stool and melena.  Genitourinary: Positive for dysuria, urgency and frequency. Negative for hematuria.       Also foul odor and cloudy urine per pt  Musculoskeletal: Negative for falls.  Skin: Negative for rash.  Neurological: Negative for dizziness, loss of consciousness and weakness.  Endo/Heme/Allergies: Does not bruise/bleed easily.  Psychiatric/Behavioral: Negative for depression and memory loss. The patient is nervous/anxious.      Past Medical History  Diagnosis Date  . Senile osteoporosis   . Oral aphthae   . Unspecified hypothyroidism   . Cystocele, midline   . Obstructive sleep apnea (adult) (pediatric)   . Insomnia, unspecified   . Unspecified essential hypertension   . Encounter for long-term (current) use of other medications   . Proteinuria   . Glycosuria   . Other and unspecified hyperlipidemia     . Viral hepatitis A without mention of hepatic coma   . Anxiety state, unspecified   . Depressive disorder, not elsewhere classified   . Rosacea   . Reflux esophagitis   . Barrett's esophagus     Past Surgical History  Procedure Laterality Date  . Tooth implant  [id number]  . Cosmetic surgery  2009    for protruding ears  . Breast enhancement surgery  2010  . Ganglion cyst excision  2011    right palm    Social History:   reports that she has never smoked. She does not have any smokeless tobacco history on file. She reports that she drinks alcohol. She reports that she does not use illicit drugs.  Family History  Problem Relation Age of Onset  . Heart attack Father   . Cancer Paternal Grandmother     Pancreatic  . Heart attack Paternal Grandfather   . Heart disease Brother    Medications: Patient's Medications  New Prescriptions   No medications on file  Previous Medications   ARMOUR THYROID 60 MG TABLET    TAKE 1 TABLET ONCE DAILY FOR THYROID.   CALCIUM-VITAMIN D (CALCIUM + D) 250-125 MG-UNIT PER TABLET    Take 1 tablet by mouth daily.   CHOLECALCIFEROL (VITAMIN D) 1000 UNITS TABLET    Take 1,000 Units by mouth daily.   CLONAZEPAM (KLONOPIN) 1 MG TABLET    Take one tablet by mouth 3-4 times daily for panic disorder  ESTRADIOL (ESTRACE VAGINAL) 0.1 MG/GM VAGINAL CREAM    Apply one gram vaginally twice weekly   FISH OIL-OMEGA-3 FATTY ACIDS 1000 MG CAPSULE    Take 2 g by mouth daily.   FLUOXETINE (PROZAC) 10 MG CAPSULE    Take two tablets once daily for mood   FLUOXETINE (PROZAC) 10 MG TABLET    TAKE 2 TABLETS ONCE DAILY FOR MOOD.   MULTIPLE VITAMIN (MULTIVITAMIN) TABLET    Take 1 tablet by mouth daily.   OMEPRAZOLE (PRILOSEC) 20 MG CAPSULE    TAKE 1 CAPSULE ONCE DAILY FOR STOMACH ACID.   ZOLPIDEM (AMBIEN) 10 MG TABLET    Take one tablet by mouth at bedtime as needed for sleep  Modified Medications   No medications on file  Discontinued Medications   No medications on  file   Physical Exam: Filed Vitals:   06/09/14 1004  BP: 128/72  Pulse: 87  Temp: 97.2 F (36.2 C)  Resp: 18  Height: 5\' 5"  (1.651 m)  Weight: 126 lb 6.4 oz (57.335 kg)  SpO2: 98%  Physical Exam  Constitutional: She is oriented to person, place, and time. She appears well-developed and well-nourished. No distress.  HENT:  Head: Normocephalic and atraumatic.  Right Ear: External ear normal.  Left Ear: External ear normal.  Nose: Nose normal.  Mouth/Throat: Oropharynx is clear and moist. No oropharyngeal exudate.  Eyes: Conjunctivae and EOM are normal. Pupils are equal, round, and reactive to light.  Wears glasses  Neck: Normal range of motion. Neck supple. No JVD present. No thyromegaly present.  Cardiovascular: Normal rate, regular rhythm, normal heart sounds and intact distal pulses.   Pulmonary/Chest: Effort normal and breath sounds normal. No respiratory distress. Right breast exhibits no inverted nipple, no mass, no nipple discharge, no skin change and no tenderness. Left breast exhibits no inverted nipple, no mass, no nipple discharge, no skin change and no tenderness. Breasts are symmetrical. There is no breast swelling.    Bilateral breast implants present with scars as drawn, no axillary lymphadenopathy  Abdominal: Soft. Bowel sounds are normal. She exhibits no distension and no mass. There is no tenderness.  Genitourinary: Vagina normal and uterus normal. Guaiac negative stool. No breast bleeding. Pelvic exam was performed with patient supine. There is no rash, tenderness, lesion or injury on the right labia. There is no rash, tenderness, lesion or injury on the left labia. No vaginal discharge found.  Musculoskeletal: Normal range of motion. She exhibits no edema and no tenderness.  Lymphadenopathy:    She has no cervical adenopathy.  Neurological: She is alert and oriented to person, place, and time. She has normal reflexes. She displays normal reflexes. No cranial nerve  deficit. She exhibits normal muscle tone. Coordination normal.  Skin: Skin is warm and dry.  Psychiatric: Her behavior is normal. Judgment and thought content normal.  anxious    Labs reviewed: Basic Metabolic Panel:  Recent Labs  06/09/13 1206 06/06/14 1339  NA 141 136  K 4.0 4.4  CL 103 98  CO2 27 25  GLUCOSE 98 91  BUN 15 12  CREATININE 0.71 0.66  CALCIUM 9.4 9.2  TSH 2.600 1.680   Liver Function Tests:  Recent Labs  06/09/13 1206 06/06/14 1339  AST 19 16  ALT 15 10  ALKPHOS 66 62  BILITOT 0.8 0.4  PROT 7.2 6.5  CBC:  Recent Labs  06/09/13 1206 06/06/14 1339  WBC 3.1* 5.1  NEUTROABS 1.6 3.6  HGB 13.6 13.4  HCT 38.9  39.7  MCV 92 98*   Lipid Panel:  Recent Labs  06/09/13 1206 06/06/14 1339  HDL 78 69  LDLCALC 139* 115*  TRIG 55 79  CHOLHDL  --  2.9   Assessment/Plan 1. Well female exam with routine gynecological exam - pap/pelvic and breast exam done today w/o any concerning findings -await pap results -referred for mammogram and bone density as she never went for them last year ([facility]) -needs to return to GI for cscope (screening due) and EGD (barrett's esophagus)--sees Dr Earlean Shawl - PAP, IG w/ Reflex HPV when ASCU [[facility], [facility]]  2. E-coli UTI - reviewed hygiene practices -encouraged plenty of water and cranberry juice this week and yogurt with abx to prevent yeast infection or cdiff - ampicillin (PRINCIPEN) 250 MG capsule; Take 1 capsule (250 mg total) by mouth 4 (four) times daily.  Dispense: 28 capsule; Refill: 0  3. Barrett's esophagus -needs her f/u EGD done, no recent difficulty at all with GERD  4. Unspecified hypothyroidism -TSH wnl with current medication  5. Senile osteoporosis -cont ca with D, increase exercise (also for cholesterol) and check bone density  6. Possible exposure to STD -requests a repeat gc/chlamydia test today due to urethral pain and ex husband's sordid behaviors  Labs/tests ordered:  Pap done,  gc/chlamydia, egd/cscope with Dr. Earlean Shawl, mammogram, bone density  Next appt:  1 year for annual exam

## 2014-06-09 NOTE — Patient Instructions (Signed)
Please drink plenty of water and cranberry juice over the next week. Eat at least 1 yogurt per day while taking the ampicillin and for a few days after completion.  We will call you with your results.

## 2014-06-13 LAB — PAP IG W/ RFLX HPV ASCU: PAP Smear Comment: 0

## 2014-06-13 LAB — GC/CHLAMYDIA PROBE AMP
Chlamydia trachomatis, NAA: NEGATIVE
Neisseria gonorrhoeae by PCR: NEGATIVE

## 2014-06-18 ENCOUNTER — Other Ambulatory Visit: Payer: Self-pay | Admitting: Internal Medicine

## 2014-06-19 ENCOUNTER — Other Ambulatory Visit: Payer: Self-pay | Admitting: *Deleted

## 2014-06-19 MED ORDER — CLONAZEPAM 1 MG PO TABS: ORAL_TABLET | ORAL | Status: DC

## 2014-06-19 NOTE — Telephone Encounter (Signed)
[facility]  

## 2014-06-20 ENCOUNTER — Other Ambulatory Visit: Payer: Self-pay | Admitting: *Deleted

## 2014-06-20 MED ORDER — FLUOXETINE HCL 10 MG PO TABS: ORAL_TABLET | ORAL | Status: DC

## 2014-06-21 ENCOUNTER — Telehealth: Payer: Self-pay | Admitting: *Deleted

## 2014-06-21 NOTE — Telephone Encounter (Signed)
Donna Ware called and stated that Metrogel was suppose to be called into pharmacy from last OV for her Rosacea. Please Advise.

## 2014-06-22 ENCOUNTER — Other Ambulatory Visit: Payer: Self-pay | Admitting: *Deleted

## 2014-06-22 MED ORDER — METRONIDAZOLE 1 % EX GEL
CUTANEOUS | Status: DC
Start: 2014-06-22 — End: 2017-03-16

## 2014-06-22 NOTE — Telephone Encounter (Signed)
Faxed Rx into pharmacy and patient notified.

## 2014-06-22 NOTE — Telephone Encounter (Signed)
This is fine.  Metrogel 1% gel--apply to affected area on face for rosacea, avoid contact with eyes, dispense 60g tube, 3 refills.

## 2014-06-29 LAB — HM DEXA SCAN

## 2014-07-04 ENCOUNTER — Encounter: Payer: Self-pay | Admitting: Internal Medicine

## 2014-07-06 ENCOUNTER — Telehealth: Payer: Self-pay | Admitting: *Deleted

## 2014-07-06 NOTE — Telephone Encounter (Signed)
Patient called regarding vaccines. Patient received her DTAP 11/25/2011 and Zoster vaccine 03/15/2008. Patient is not due for either one. Patient Notified.

## 2014-07-14 ENCOUNTER — Encounter: Payer: Self-pay | Admitting: *Deleted

## 2014-07-17 ENCOUNTER — Encounter: Payer: Self-pay | Admitting: *Deleted

## 2014-07-17 DIAGNOSIS — M810 Age-related osteoporosis without current pathological fracture: Secondary | ICD-10-CM

## 2014-08-01 ENCOUNTER — Telehealth: Payer: Self-pay | Admitting: Certified Nurse Midwife

## 2014-08-01 ENCOUNTER — Ambulatory Visit (INDEPENDENT_AMBULATORY_CARE_PROVIDER_SITE_OTHER): Payer: Federal, State, Local not specified - PPO | Admitting: Certified Nurse Midwife

## 2014-08-01 ENCOUNTER — Encounter: Payer: Self-pay | Admitting: Certified Nurse Midwife

## 2014-08-01 VITALS — BP 110/64 | HR 68 | Temp 98.6°F | Resp 16 | Ht 65.5 in | Wt 129.0 lb

## 2014-08-01 DIAGNOSIS — N390 Urinary tract infection, site not specified: Secondary | ICD-10-CM

## 2014-08-01 DIAGNOSIS — E739 Lactose intolerance, unspecified: Secondary | ICD-10-CM | POA: Insufficient documentation

## 2014-08-01 LAB — POCT URINALYSIS DIPSTICK
BILIRUBIN UA: NEGATIVE
Blood, UA: NEGATIVE
GLUCOSE UA: NEGATIVE
KETONES UA: NEGATIVE
Nitrite, UA: NEGATIVE
PROTEIN UA: NEGATIVE
Urobilinogen, UA: NEGATIVE
pH, UA: 5

## 2014-08-01 MED ORDER — NITROFURANTOIN MONOHYD MACRO 100 MG PO CAPS: 100.0000 mg | ORAL_CAPSULE | Freq: Two times a day (BID) | ORAL | Status: DC

## 2014-08-01 NOTE — Telephone Encounter (Signed)
Spoke with patient. Patient states that she had diarrhea about 5 days ago and started to have cloudy urine, odor to urine, burning, urgency, and pressure. Denies fever, nausea, vomiting, and blood in urine. Patient states "It could be E.Coli." Advised patient it is better to be seen sooner than later for treatment of UTI. Advised could move patient's appointment up if she is available. Patient states "I thought I had to be off of the uristat for a couple of days before coming in?" Advised patient that I spoke with Dr.Lathrop and she states that she can be on the uristat until seen for appointment. Patient is agreeable and would like to move appointment up to today. Appointment scheduled for today at 3:30pm with Donna Ware CNM. Patient agreeable to date and time.  Routing to provider for final review. Patient agreeable to disposition. Will close encounter

## 2014-08-01 NOTE — Progress Notes (Signed)
63 y.o. separated g0p0 here with complaint of UTI, with onset  3 days. Patient complaining of urinary frequency/urgency/ and pain with urination. Patient denies fever, chills, nausea or back pain. No new personal products. Patient feels not related to sexual activity. Denies any vaginal symptoms or new personal products. . Menopausal with vaginal dryness uses Estrace vaginal cream with good results. Patient is lactose intolerant and has bouts with diarrhea at times. Working on lactose free diet at present. No other health issues today.   O: Healthy female WDWN Affect: Normal, orientation x 3 Skin : warm and dry CVAT: negative bilateral Abdomen: positive for suprapubic tenderness  Pelvic exam: External genital area: normal, no lesions Bladder,Urethra, Urethral meatus: tender Vagina: normal vaginal discharge, normal appearance  Wet prep Cervix: normal, non tender Uterus:normal,non tender Adnexa: normal non tender, no fullness or masses  Poct urine-wbc 1+  A: UTI Lactose Intolerance  P: Reviewed findings of UTI [id number] see order [id number] micro, culture Reviewed warning signs and symptoms of UTI and need to increase water daily. Discussed personal hygiene with frequent diarrhea and increase risk of UTI. Encouraged probiotic use to help with normal GI flora. Encouraged to limit soda, tea, and coffee  RV 2 week if TOC needed for positive culture  RV prn

## 2014-08-01 NOTE — Telephone Encounter (Signed)
Pt has an appt Thursday for bladder infection and wondering when should she stop taking the uristat (sp?) pills before she come in.

## 2014-08-01 NOTE — Patient Instructions (Signed)
Lactose Intolerance, Adult Lactose intolerance is when the body is not able to digest lactose, a sugar found in milk and milk products. Lactose intolerance is caused by your body not producing enough of the enzyme lactase. When there is not enough lactase to digest the amount of lactose consumed, discomfort may be felt. Lactose intolerance is not a milk allergy. For most people, lactase deficiency is a condition that develops naturally over time. After about the age of 2, the body begins to produce less lactase. But many people may not experience symptoms until they are much older. CAUSES Things that can cause you to be lactose intolerant include:  Aging.  Being born without the ability to make lactase.  Certain digestive diseases.  Injuries to the small intestine. SYMPTOMS   Feeling sick to your stomach (nauseous).  Diarrhea.  Cramps.  Bloating.  Gas. Symptoms usually show up a half hour or 2 hours after eating or drinking products containing lactose. TREATMENT  No treatment can improve the body's ability to produce lactase. However, symptoms can be controlled through diet. A medicine may be given to you to take when you consume lactose-containing foods or drinks. The medicine contains the lactase enzyme, which help the body digest lactose better. HOME CARE INSTRUCTIONS  Eat or drink dairy products as told by your caregiver or dietician.  Take all medicine as directed by your caregiver.  Find lactose-free or lactose-reduced products at your local grocery store.  Talk to your caregiver or dietician to decide if you need any dietary supplements. The following is the amount of calcium needed from the diet:  19 to 50 years: 1000 mg  Over 50 years: 1200 mg Calcium and Lactose in Common Foods Non-Dairy Products / Calcium Content (mg)  Calcium-fortified orange juice, 1 cup / 308 to 344 mg  Sardines, with edible bones, 3 oz / 270 mg  Salmon, canned, with edible bones, 3 oz /  205 mg  Soymilk, fortified, 1 cup / 200 mg  Broccoli (raw), 1 cup / 90 mg  Orange, 1 medium / 50 mg  Pinto beans,  cup / 40 mg  Tuna, canned, 3 oz / 10 mg  Lettuce greens,  cup / 10 mg Dairy Products / Calcium Content (mg) / Lactose Content (g)  Yogurt, plain, low-fat, 1 cup / 415 mg / 5 g  Milk, reduced fat, 1 cup / 295 mg / 11 g  Swiss cheese, 1 oz / 270 mg / 1 g  Ice cream,  cup / 85 mg / 6 g  Cottage cheese,  cup / 75 mg / 2 to 3 g SEEK MEDICAL CARE IF: You have no relief from your symptoms. Document Released: 12/15/2005 Document Revised: 03/08/2012 Document Reviewed: 03/17/2014 [facility] Patient Information 2015 [place], [place]. This information is not intended to replace advice given to you by your health care provider. Make sure you discuss any questions you have with your health care provider. Urinary Tract Infection Urinary tract infections (UTIs) can develop anywhere along your urinary tract. Your urinary tract is your body's drainage system for removing wastes and extra water. Your urinary tract includes two kidneys, two ureters, a bladder, and a urethra. Your kidneys are a pair of bean-shaped organs. Each kidney is about the size of your fist. They are located below your ribs, one on each side of your spine. CAUSES Infections are caused by microbes, which are microscopic organisms, including fungi, viruses, and bacteria. These organisms are so small that they can only be  seen through a microscope. Bacteria are the microbes that most commonly cause UTIs. SYMPTOMS  Symptoms of UTIs may vary by age and gender of the patient and by the location of the infection. Symptoms in young women typically include a frequent and intense urge to urinate and a painful, burning feeling in the bladder or urethra during urination. Older women and men are more likely to be tired, shaky, and weak and have muscle aches and abdominal pain. A fever may mean the infection is in your kidneys.  Other symptoms of a kidney infection include pain in your back or sides below the ribs, nausea, and vomiting. DIAGNOSIS To diagnose a UTI, your caregiver will ask you about your symptoms. Your caregiver also will ask to provide a urine sample. The urine sample will be tested for bacteria and white blood cells. White blood cells are made by your body to help fight infection. TREATMENT  Typically, UTIs can be treated with medication. Because most UTIs are caused by a bacterial infection, they usually can be treated with the use of antibiotics. The choice of antibiotic and length of treatment depend on your symptoms and the type of bacteria causing your infection. HOME CARE INSTRUCTIONS  If you were prescribed antibiotics, take them exactly as your caregiver instructs you. Finish the medication even if you feel better after you have only taken some of the medication.  Drink enough water and fluids to keep your urine clear or pale yellow.  Avoid caffeine, tea, and carbonated beverages. They tend to irritate your bladder.  Empty your bladder often. Avoid holding urine for long periods of time.  Empty your bladder before and after sexual intercourse.  After a bowel movement, women should cleanse from front to back. Use each tissue only once. SEEK MEDICAL CARE IF:   You have back pain.  You develop a fever.  Your symptoms do not begin to resolve within 3 days. SEEK IMMEDIATE MEDICAL CARE IF:   You have severe back pain or lower abdominal pain.  You develop chills.  You have nausea or vomiting.  You have continued burning or discomfort with urination. MAKE SURE YOU:   Understand these instructions.  Will watch your condition.  Will get help right away if you are not doing well or get worse. Document Released: 09/24/2005 Document Revised: 06/15/2012 Document Reviewed: 01/23/2012 [facility] Patient Information 2015 [place], [place]. This information is not intended to replace advice  given to you by your health care provider. Make sure you discuss any questions you have with your health care provider.

## 2014-08-02 LAB — URINALYSIS, MICROSCOPIC ONLY
Bacteria, UA: NONE SEEN
CASTS: NONE SEEN
CRYSTALS: NONE SEEN
Squamous Epithelial / LPF: NONE SEEN

## 2014-08-02 NOTE — Progress Notes (Signed)
Reviewed personally.  M. Suzanne Tovah Slavick, MD.  

## 2014-08-03 ENCOUNTER — Ambulatory Visit: Payer: Federal, State, Local not specified - PPO | Admitting: Certified Nurse Midwife

## 2014-08-03 LAB — URINE CULTURE
COLONY COUNT: NO GROWTH
ORGANISM ID, BACTERIA: NO GROWTH

## 2014-08-03 NOTE — Addendum Note (Signed)
Addended by: Regina Eck on: 08/03/2014 04:58 PM   Modules accepted: Orders

## 2014-08-18 ENCOUNTER — Ambulatory Visit (INDEPENDENT_AMBULATORY_CARE_PROVIDER_SITE_OTHER): Payer: Federal, State, Local not specified - PPO

## 2014-08-18 ENCOUNTER — Other Ambulatory Visit: Payer: Self-pay | Admitting: *Deleted

## 2014-08-18 VITALS — BP 134/90 | HR 72 | Ht 65.5 in | Wt 126.0 lb

## 2014-08-18 DIAGNOSIS — N390 Urinary tract infection, site not specified: Secondary | ICD-10-CM

## 2014-08-18 MED ORDER — CLONAZEPAM 1 MG PO TABS
ORAL_TABLET | ORAL | Status: DC
Start: 2014-08-18 — End: 2014-12-18

## 2014-08-18 NOTE — Telephone Encounter (Signed)
[facility]  

## 2014-08-18 NOTE — Progress Notes (Signed)
Pt is here for a Urine TOC. Pt states she has finished antibotics but is still having sx. She states she is having the frequency, pain and pressure. Per Dr. Charlies Constable, take the micro and we will call with results on Monday to see what treatment we need to do. Told pt and she voiced understanding and agreed

## 2014-08-19 LAB — URINALYSIS, MICROSCOPIC ONLY
CASTS: NONE SEEN
CRYSTALS: NONE SEEN
SQUAMOUS EPITHELIAL / LPF: NONE SEEN

## 2014-08-28 ENCOUNTER — Ambulatory Visit (INDEPENDENT_AMBULATORY_CARE_PROVIDER_SITE_OTHER): Payer: Federal, State, Local not specified - PPO | Admitting: Nurse Practitioner

## 2014-08-28 ENCOUNTER — Encounter: Payer: Self-pay | Admitting: Nurse Practitioner

## 2014-08-28 ENCOUNTER — Telehealth: Payer: Self-pay | Admitting: Certified Nurse Midwife

## 2014-08-28 VITALS — BP 126/84 | HR 68 | Temp 98.5°F | Ht 65.5 in | Wt 124.0 lb

## 2014-08-28 DIAGNOSIS — R300 Dysuria: Secondary | ICD-10-CM

## 2014-08-28 LAB — POCT URINALYSIS DIPSTICK
Bilirubin, UA: NEGATIVE
Glucose, UA: NEGATIVE
KETONES UA: NEGATIVE
Nitrite, UA: NEGATIVE
PROTEIN UA: NEGATIVE
RBC UA: NEGATIVE
Urobilinogen, UA: NEGATIVE
pH, UA: 5

## 2014-08-28 MED ORDER — CIPROFLOXACIN HCL 500 MG PO TABS: 500.0000 mg | ORAL_TABLET | Freq: Two times a day (BID) | ORAL | Status: DC

## 2014-08-28 NOTE — Telephone Encounter (Signed)
Left message to call Cord Wilczynski at [phone]. 

## 2014-08-28 NOTE — Telephone Encounter (Signed)
Spoke with patient. She states that her urine is "still testing positive for leukocytes and I am having symptoms again but it is not vaginal symptoms." Patient was seen on 8/21 for TOC and urine was showing increased WBC's. Follow up was recommended but patient declined. Advised patient will need to come in for follow up since she is still having symptoms. Patient is agreeable. Appointment made for today at 2:15pm with Donna Ware, [place]. Patient is agreeable to date and time.  Routing to [employer], FNP as covering Cc: Donna Ware CNM   Routing to provider for final review. Patient agreeable to disposition. Will close encounter

## 2014-08-28 NOTE — Progress Notes (Signed)
Subjective:     Patient ID: Donna Ware, female   DOB: July 17, 1951, 63 y.o.   MRN: [id number]  HPI  This 63 yo Fe presents with UTI symptoms in early August and saw DL she took [place] for a week.  After about 10 days felt all normal.  Took prep for colonoscopy and endoscopy which showed a small polyp in the stomach.  Right after the procedure again had urinary symptoms of urgency and frequency.  Some pressure lower abdomen.  No fever chills but did have some' kidney achy feeling'.  She has no other symptoms and these were to initiated with SA.  Review of Systems  Constitutional: Negative for fever, chills and fatigue.  Gastrointestinal: Negative.  Negative for nausea, vomiting, abdominal pain, diarrhea and constipation.  Genitourinary: Positive for urgency and frequency. Negative for flank pain, vaginal discharge, vaginal pain, menstrual problem and pelvic pain.  Musculoskeletal: Negative.   Skin: Negative.   Neurological: Negative.   Psychiatric/Behavioral: Negative.        Objective:   Physical Exam  Constitutional: She is oriented to person, place, and time. She appears well-developed and well-nourished.  Abdominal: Soft. She exhibits no distension. There is no tenderness. There is no rebound and no guarding.  Slight mid back pain without being true flank pain.  Neurological: She is alert and oriented to person, place, and time.  Psychiatric: She has a normal mood and affect. Her behavior is normal. Judgment and thought content normal.       Assessment:     R/O UTI - history of same 62 month ago   Plan:     Will start her on Cipro since her other recent antibiotic was Macrobid Will follow with C&S

## 2014-08-28 NOTE — Telephone Encounter (Signed)
Patient is requesting an appointment today. Patient says her urine is "still testing positive for leukocytes."

## 2014-08-28 NOTE — Patient Instructions (Signed)

## 2014-08-28 NOTE — Progress Notes (Signed)
Encounter reviewed by Dr. Brook Silva.  

## 2014-08-29 LAB — URINALYSIS, MICROSCOPIC ONLY
CASTS: NONE SEEN
CRYSTALS: NONE SEEN
SQUAMOUS EPITHELIAL / LPF: NONE SEEN

## 2014-08-30 LAB — URINE CULTURE: Colony Count: 70000

## 2014-09-18 ENCOUNTER — Encounter: Payer: Self-pay | Admitting: Internal Medicine

## 2014-09-27 ENCOUNTER — Telehealth: Payer: Self-pay | Admitting: *Deleted

## 2014-09-27 NOTE — Telephone Encounter (Signed)
I have attempted to contact this patient by phone with the following results: left message to return my call on answering machine [phone]).

## 2014-09-27 NOTE — Telephone Encounter (Signed)
Message copied by Graylon Good on Wed Sep 27, 2014  2:16 PM ------      Message from: Graylon Good      Created: Tue Sep 05, 2014 11:51 AM      Regarding: TOC       Needs TOC.  Pt will call when she returns from [place] on 9/24. ------

## 2014-10-16 ENCOUNTER — Other Ambulatory Visit: Payer: Self-pay | Admitting: Internal Medicine

## 2014-11-02 NOTE — Telephone Encounter (Signed)
I have attempted to contact this patient by phone with the following results: left message to return my call on answering machine [phone]).

## 2014-11-16 ENCOUNTER — Other Ambulatory Visit: Payer: Self-pay | Admitting: *Deleted

## 2014-11-16 MED ORDER — ZOLPIDEM TARTRATE 10 MG PO TABS: ORAL_TABLET | ORAL | Status: DC

## 2014-11-16 NOTE — Telephone Encounter (Signed)
[facility]  

## 2014-12-18 ENCOUNTER — Other Ambulatory Visit: Payer: Self-pay | Admitting: *Deleted

## 2014-12-18 MED ORDER — CLONAZEPAM 1 MG PO TABS: ORAL_TABLET | ORAL | Status: DC

## 2014-12-18 NOTE — Telephone Encounter (Signed)
[facility]  

## 2015-01-15 ENCOUNTER — Other Ambulatory Visit: Payer: Self-pay | Admitting: *Deleted

## 2015-01-15 MED ORDER — ZOLPIDEM TARTRATE 10 MG PO TABS: ORAL_TABLET | ORAL | Status: DC

## 2015-01-15 NOTE — Telephone Encounter (Signed)
[facility]  

## 2015-03-19 ENCOUNTER — Other Ambulatory Visit: Payer: Self-pay | Admitting: Internal Medicine

## 2015-03-19 ENCOUNTER — Other Ambulatory Visit: Payer: Self-pay | Admitting: Nurse Practitioner

## 2015-04-16 ENCOUNTER — Other Ambulatory Visit: Payer: Self-pay | Admitting: *Deleted

## 2015-04-16 MED ORDER — ZOLPIDEM TARTRATE 10 MG PO TABS: ORAL_TABLET | ORAL | Status: DC

## 2015-04-16 MED ORDER — CLONAZEPAM 1 MG PO TABS: ORAL_TABLET | ORAL | Status: DC

## 2015-04-16 NOTE — Telephone Encounter (Signed)
[facility]  

## 2015-05-18 ENCOUNTER — Other Ambulatory Visit: Payer: Self-pay | Admitting: Nurse Practitioner

## 2015-06-11 ENCOUNTER — Ambulatory Visit (INDEPENDENT_AMBULATORY_CARE_PROVIDER_SITE_OTHER): Payer: Federal, State, Local not specified - PPO | Admitting: Internal Medicine

## 2015-06-11 ENCOUNTER — Encounter: Payer: Self-pay | Admitting: Internal Medicine

## 2015-06-11 VITALS — BP 128/80 | HR 83 | Temp 97.8°F | Resp 18 | Ht 66.0 in | Wt 130.4 lb

## 2015-06-11 DIAGNOSIS — Z0000 Encounter for general adult medical examination without abnormal findings: Secondary | ICD-10-CM | POA: Diagnosis not present

## 2015-06-11 DIAGNOSIS — G4700 Insomnia, unspecified: Secondary | ICD-10-CM

## 2015-06-11 DIAGNOSIS — L719 Rosacea, unspecified: Secondary | ICD-10-CM | POA: Diagnosis not present

## 2015-06-11 DIAGNOSIS — K2270 Barrett's esophagus without dysplasia: Secondary | ICD-10-CM | POA: Diagnosis not present

## 2015-06-11 DIAGNOSIS — M810 Age-related osteoporosis without current pathological fracture: Secondary | ICD-10-CM

## 2015-06-11 DIAGNOSIS — E039 Hypothyroidism, unspecified: Secondary | ICD-10-CM

## 2015-06-11 NOTE — Progress Notes (Signed)
Patient ID: Donna Ware, female   DOB: 05/02/1951, 64 y.o.   MRN: [id number]   Location:  [facility]  Code Status: full code Goals of Care: Advanced Directive information Does patient have an advance directive?: Yes, Type of Advance Directive: Living will, Does patient want to make changes to advanced directive?: No - Patient declined   Chief Complaint  Patient presents with  . Annual Exam    Annual exam    HPI: Patient is a 64 y.o. white female seen in the office today for her annual exam.    She has already had a pap 6/15 and mammogram 7/15 this year.  She is not depressed.  She has not fallen.  She's had pneumovax, but has not had prevnar yet, but is healthy so does not really need until 51.  Had tdap, flu in past.  Had cscope in 2015 with one polyp, Dr. Earlean Shawl.      Ambien 5mg  has stopped working for her.  AD runs in her family.  Has been benadryl instead, but read that it's bad for people with memory loss.  Has difficulty falling asleep and sometimes wakes up several times.  Will get up and do boring things until she can sleep again.  Her boyfriend has the tv in the bedroom before bed.  Didn't drink coffee or beer before.  Will drink 1-2 beers with dinner now.  Hasn't otherwise changed her diet, but has gained weight so attributes it to that.    Review of Systems:  Review of Systems  Constitutional: Negative for fever, chills and malaise/fatigue.  HENT: Negative for congestion and hearing loss.   Eyes: Negative for blurred vision.       Sees eye doctor annually  Respiratory: Negative for cough and shortness of breath.   Cardiovascular: Negative for chest pain.  Gastrointestinal: Negative for abdominal pain, constipation, blood in stool and melena.       Had one polyp that was benign on cscope with Dr. Earlean Shawl and EGD also normal  Genitourinary: Negative for dysuria, urgency and frequency.  Musculoskeletal: Negative for myalgias, back  pain, joint pain and falls.  Skin: Negative for rash.  Neurological: Negative for dizziness and weakness.  Psychiatric/Behavioral: Negative for depression and memory loss. The patient is nervous/anxious and has insomnia.     Past Medical History  Diagnosis Date  . Senile osteoporosis   . Oral aphthae   . Unspecified hypothyroidism   . Cystocele, midline   . Obstructive sleep apnea (adult) (pediatric)   . Insomnia, unspecified   . Unspecified essential hypertension   . Encounter for long-term (current) use of other medications   . Proteinuria   . Glycosuria   . Other and unspecified hyperlipidemia   . Viral hepatitis A without mention of hepatic coma   . Anxiety state, unspecified   . Depressive disorder, not elsewhere classified   . Rosacea   . Reflux esophagitis   . Barrett's esophagus   . STD (sexually transmitted disease)     HSV2    Past Surgical History  Procedure Laterality Date  . Tooth implant  [id number]  . Cosmetic surgery  2009    for protruding ears  . Breast enhancement surgery  2010  . Ganglion cyst excision  2011    right palm  . Tummy tuck      Allergies  Allergen Reactions  . Adhesive [Tape]   . Lactose Intolerance (Gi)   . Sulfa Antibiotics  Medications: Patient's Medications  New Prescriptions   No medications on file  Previous Medications   ARMOUR THYROID 60 MG TABLET    TAKE 1 TABLET ONCE DAILY FOR THYROID.   CHOLECALCIFEROL (VITAMIN D) 1000 UNITS TABLET    Take 1,000 Units by mouth daily.   CLONAZEPAM (KLONOPIN) 1 MG TABLET    Take one tablet by mouth 3-4 times daily for panic disorder   ESTRACE VAGINAL 0.1 MG/GM VAGINAL CREAM    INSERT 1 GRAM VAGINALLY TWICE WEEKLY.   FISH OIL-OMEGA-3 FATTY ACIDS 1000 MG CAPSULE    Take 1,400 mg by mouth daily.    METRONIDAZOLE (METROGEL) 1 % GEL    Apply to affected area on face for rosacea, avoid contact with eyes   MULTIPLE VITAMIN (MULTIVITAMIN) TABLET    Take 1 tablet by mouth daily.   OMEPRAZOLE  (PRILOSEC) 20 MG CAPSULE    TAKE 1 CAPSULE ONCE DAILY FOR STOMACH ACID.  Modified Medications   No medications on file  Discontinued Medications   BETA CAROTENE W/MINERALS (OCUVITE) TABLET    Take 1 tablet by mouth daily.   CIPROFLOXACIN (CIPRO) 500 MG TABLET    Take 1 tablet (500 mg total) by mouth 2 (two) times daily.   DOCUSATE SODIUM (COLACE) 100 MG CAPSULE    Take 100 mg by mouth daily as needed for mild constipation.   ZOLPIDEM (AMBIEN) 10 MG TABLET    TAKE 1 TABLET AT BEDTIME AS NEEDED FOR SLEEP.    Physical Exam: Filed Vitals:   06/11/15 1351  BP: 128/80  Pulse: 83  Temp: 97.8 F (36.6 C)  TempSrc: Oral  Resp: 18  Height: 5\' 6"  (1.676 m)  Weight: 130 lb 6.4 oz (59.149 kg)  SpO2: 97%   Physical Exam  Constitutional: She is oriented to person, place, and time. She appears well-developed and well-nourished. No distress.  HENT:  Head: Normocephalic and atraumatic.  Right Ear: External ear normal.  Left Ear: External ear normal.  Nose: Nose normal.  Mouth/Throat: Oropharynx is clear and moist. No oropharyngeal exudate.  Eyes: Conjunctivae and EOM are normal. Pupils are equal, round, and reactive to light.  glasses  Neck: Normal range of motion. Neck supple. No JVD present. No tracheal deviation present. No thyromegaly present.  Cardiovascular: Normal rate, regular rhythm, normal heart sounds and intact distal pulses.   Pulmonary/Chest: Effort normal and breath sounds normal. No respiratory distress. Right breast exhibits no inverted nipple, no mass, no nipple discharge, no skin change and no tenderness. Left breast exhibits no inverted nipple, no mass, no nipple discharge, no skin change and no tenderness.  Scars present from implants placed beneath muscles bilaterally  Abdominal: Soft. Bowel sounds are normal. She exhibits no distension and no mass. There is no tenderness.  Genitourinary:  Deferred--pap done last year  Musculoskeletal: Normal range of motion. She exhibits  no edema or tenderness.  Neurological: She is alert and oriented to person, place, and time. She has normal reflexes.  Skin: Skin is warm and dry.  Psychiatric: She has a normal mood and affect. Her behavior is normal. Judgment and thought content normal.    Labs reviewed: Needs to do today Procedures since last visit: Saw gyn, had UTI Bone density:  Osteoporosis, did take bisphosphonates in past and is on vitamin D  Assessment/Plan 1. Barrett's esophagus -EGD was normal when saw Dr. Earlean Shawl 2015, continues on PPI ; makes her a poor candidate to resume oral bisphosphonate, plus her bone density has trended down despite  prior use  - Comprehensive metabolic panel; Future  2. Senile osteoporosis -per bone density in July 2015, already doing vitamin D supplement and weighbearing exercise -consider prolia injections--need to discuss this with her more  3. Insomnia -will try belsomra 10mg  at bedtime as needed for insomnia  -discussed side effects of benadryl and ambien she has taken that was prescribed by another physician initially has not been working anymore  4. Rosacea -cont her current metrogel for this  5. Hypothyroidism, unspecified hypothyroidism type -cont armour thyroid 60mg  also prescribed by another physician but reassess labs: - TSH; Future  6. Routine general medical examination at a health care facility - see hpi for review of her preventive care - CBC with Differential/Platelet; Future - Comprehensive metabolic panel; Future - TSH; Future - Lipid panel; Future   Labs/tests ordered:   Orders Placed This Encounter  Procedures  . CBC with Differential/Platelet    Standing Status: Future     Number of Occurrences:      Standing Expiration Date: 06/10/2016  . Comprehensive metabolic panel    Standing Status: Future     Number of Occurrences:      Standing Expiration Date: 06/10/2016    Order Specific Question:  Has the patient fasted?    Answer:  Yes  . TSH     Standing Status: Future     Number of Occurrences:      Standing Expiration Date: 06/10/2016  . Lipid panel    Standing Status: Future     Number of Occurrences:      Standing Expiration Date: 06/10/2016    Order Specific Question:  Has the patient fasted?    Answer:  Yes    Next appt:  1 year for annual exam and labs  Justine Dines L. Teleah Villamar, D.O. [facility] [address], [place] [address] Cell Phone (Mon-Fri 8am-5pm):  [phone] On Call:  [phone] & follow prompts after 5pm & weekends Office Phone:  [phone] Office Fax:  [phone]

## 2015-06-15 ENCOUNTER — Telehealth: Payer: Self-pay | Admitting: *Deleted

## 2015-06-15 NOTE — Telephone Encounter (Signed)
Patient called and stated that the Belsomra causes her to have bad nightmares. Wants to know if she could just continue Ambien and it just be increased. Please Advise.

## 2015-06-16 NOTE — Telephone Encounter (Signed)
Ok, ambien 10mg  po qhs prn insomnia.  She needs to be aware that this can cause sleepwalking, falls, confusion especially as she ages.

## 2015-06-18 ENCOUNTER — Other Ambulatory Visit: Payer: Federal, State, Local not specified - PPO

## 2015-06-18 ENCOUNTER — Other Ambulatory Visit: Payer: Self-pay

## 2015-06-18 DIAGNOSIS — E039 Hypothyroidism, unspecified: Secondary | ICD-10-CM

## 2015-06-18 DIAGNOSIS — K2270 Barrett's esophagus without dysplasia: Secondary | ICD-10-CM

## 2015-06-18 DIAGNOSIS — Z0000 Encounter for general adult medical examination without abnormal findings: Secondary | ICD-10-CM

## 2015-06-18 MED ORDER — ZOLPIDEM TARTRATE ER 12.5 MG PO TBCR: 12.5000 mg | EXTENDED_RELEASE_TABLET | Freq: Every evening | ORAL | Status: DC | PRN

## 2015-06-18 NOTE — Telephone Encounter (Signed)
LMOM to return call.

## 2015-06-18 NOTE — Telephone Encounter (Signed)
Ok, let's try the ambien 12.5mg  CR.  Same warnings.  Please be sure to relay them to her.

## 2015-06-18 NOTE — Telephone Encounter (Signed)
Patient wants a higher dose than 10mg . Stated that when she did take 10mg  she would sometimes take two at a time and it still didn't help. Please Advise.

## 2015-06-19 ENCOUNTER — Other Ambulatory Visit: Payer: Self-pay

## 2015-06-19 LAB — CBC WITH DIFFERENTIAL/PLATELET
Basophils Absolute: 0 10*3/uL (ref 0.0–0.2)
Basos: 1 %
EOS (ABSOLUTE): 0.1 10*3/uL (ref 0.0–0.4)
Eos: 3 %
Hematocrit: 42.2 % (ref 34.0–46.6)
Hemoglobin: 14.2 g/dL (ref 11.1–15.9)
Immature Grans (Abs): 0 10*3/uL (ref 0.0–0.1)
Immature Granulocytes: 0 %
Lymphocytes Absolute: 1.2 10*3/uL (ref 0.7–3.1)
Lymphs: 40 %
MCH: 33.2 pg — ABNORMAL HIGH (ref 26.6–33.0)
MCHC: 33.6 g/dL (ref 31.5–35.7)
MCV: 99 fL — ABNORMAL HIGH (ref 79–97)
Monocytes Absolute: 0.4 10*3/uL (ref 0.1–0.9)
Monocytes: 12 %
Neutrophils Absolute: 1.4 10*3/uL (ref 1.4–7.0)
Neutrophils: 44 %
Platelets: 328 10*3/uL (ref 150–379)
RBC: 4.28 x10E6/uL (ref 3.77–5.28)
RDW: 12.5 % (ref 12.3–15.4)
WBC: 3.1 10*3/uL — ABNORMAL LOW (ref 3.4–10.8)

## 2015-06-19 LAB — COMPREHENSIVE METABOLIC PANEL
ALT: 11 IU/L (ref 0–32)
AST: 15 IU/L (ref 0–40)
Albumin/Globulin Ratio: 1.8 (ref 1.1–2.5)
Albumin: 4.5 g/dL (ref 3.6–4.8)
Alkaline Phosphatase: 54 IU/L (ref 39–117)
BUN/Creatinine Ratio: 15 (ref 11–26)
BUN: 12 mg/dL (ref 8–27)
Bilirubin Total: 0.6 mg/dL (ref 0.0–1.2)
CO2: 23 mmol/L (ref 18–29)
Calcium: 9.1 mg/dL (ref 8.7–10.3)
Chloride: 104 mmol/L (ref 97–108)
Creatinine, Ser: 0.78 mg/dL (ref 0.57–1.00)
GFR calc Af Amer: 94 mL/min/{1.73_m2} (ref >59)
GFR calc non Af Amer: 81 mL/min/{1.73_m2} (ref >59)
Globulin, Total: 2.5 g/dL (ref 1.5–4.5)
Glucose: 111 mg/dL — ABNORMAL HIGH (ref 65–99)
Potassium: 4 mmol/L (ref 3.5–5.2)
Sodium: 145 mmol/L — ABNORMAL HIGH (ref 134–144)
Total Protein: 7 g/dL (ref 6.0–8.5)

## 2015-06-19 LAB — LIPID PANEL
Chol/HDL Ratio: 2.5 ratio units (ref 0.0–4.4)
Cholesterol, Total: 244 mg/dL — ABNORMAL HIGH (ref 100–199)
HDL: 99 mg/dL (ref >39)
LDL Calculated: 134 mg/dL — ABNORMAL HIGH (ref 0–99)
Triglycerides: 56 mg/dL (ref 0–149)
VLDL Cholesterol Cal: 11 mg/dL (ref 5–40)

## 2015-06-19 LAB — TSH: TSH: 6.98 u[IU]/mL — ABNORMAL HIGH (ref 0.450–4.500)

## 2015-06-20 NOTE — Telephone Encounter (Signed)
Patient called and left voicemail and thanked [place] for all of our help and wanted to let us know she picked up rx and had a good night sleep.

## 2015-06-21 LAB — T3, FREE: T3, Free: 2.5 pg/mL (ref 2.0–4.4)

## 2015-06-21 LAB — SPECIMEN STATUS REPORT

## 2015-07-16 ENCOUNTER — Other Ambulatory Visit: Payer: Self-pay | Admitting: Internal Medicine

## 2015-07-16 ENCOUNTER — Other Ambulatory Visit: Payer: Self-pay | Admitting: *Deleted

## 2015-07-16 MED ORDER — ZOLPIDEM TARTRATE ER 12.5 MG PO TBCR: 12.5000 mg | EXTENDED_RELEASE_TABLET | Freq: Every evening | ORAL | Status: DC | PRN

## 2015-07-16 NOTE — Telephone Encounter (Signed)
[facility]  

## 2015-07-17 ENCOUNTER — Telehealth: Payer: Self-pay

## 2015-07-17 NOTE — Telephone Encounter (Signed)
Message received via fax indicating patient need PA for Ambien ID # [id number]  I called [phone] to initiate PA. Mediation was approved 05/18/15- 07/16/16  I called the pharmacy and informed them rx was approved.

## 2015-07-19 NOTE — Telephone Encounter (Signed)
Fax was received confirming approval. Fax was given to provider to sign then to medical records to scan. Pharmacy was already informed.

## 2015-08-10 ENCOUNTER — Telehealth: Payer: Self-pay

## 2015-08-10 NOTE — Telephone Encounter (Signed)
-----   Message from Gayland Curry, DO sent at 08/09/2015  5:43 PM EDT ----- Did Donna Ware get her mammogram at another location?  The order I had entered has expired.  If she has not and is planning to get one, let's reenter the order for routine mammogram and put 3d in comments.  ----- Message -----    From: SYSTEM    Sent: 08/09/2015  12:04 AM      To: Gayland Curry, DO

## 2015-08-10 NOTE — Telephone Encounter (Signed)
Patient called back, she thought that Dr. Mariea Clonts told her that she didn't need a mammogram now but every 2 years, but her mother and grandmother both had breast cancer. Her last mammogram was at [facility], she will call to set up appointment. No order needed they are not on Epic.

## 2015-08-10 NOTE — Telephone Encounter (Signed)
Left message on voicemail for patient to return call when available   

## 2015-08-15 LAB — HM MAMMOGRAPHY

## 2015-08-16 ENCOUNTER — Encounter: Payer: Self-pay | Admitting: *Deleted

## 2015-08-17 ENCOUNTER — Other Ambulatory Visit: Payer: Self-pay | Admitting: Internal Medicine

## 2015-08-17 ENCOUNTER — Other Ambulatory Visit: Payer: Self-pay | Admitting: Nurse Practitioner

## 2015-08-20 ENCOUNTER — Other Ambulatory Visit: Payer: Self-pay | Admitting: Internal Medicine

## 2015-09-14 ENCOUNTER — Ambulatory Visit (INDEPENDENT_AMBULATORY_CARE_PROVIDER_SITE_OTHER): Payer: Federal, State, Local not specified - PPO

## 2015-09-14 DIAGNOSIS — Z23 Encounter for immunization: Secondary | ICD-10-CM

## 2015-09-17 ENCOUNTER — Ambulatory Visit: Payer: Federal, State, Local not specified - PPO

## 2015-09-17 ENCOUNTER — Other Ambulatory Visit: Payer: Self-pay | Admitting: Internal Medicine

## 2015-09-18 ENCOUNTER — Other Ambulatory Visit: Payer: Self-pay | Admitting: Internal Medicine

## 2015-09-18 NOTE — Telephone Encounter (Signed)
Patient called, pharmacy did not receive fax. Phoned in Rx to pharmacist.

## 2015-10-17 ENCOUNTER — Other Ambulatory Visit: Payer: Self-pay | Admitting: Internal Medicine

## 2015-11-16 ENCOUNTER — Other Ambulatory Visit: Payer: Self-pay | Admitting: *Deleted

## 2015-11-16 ENCOUNTER — Other Ambulatory Visit: Payer: Self-pay | Admitting: Internal Medicine

## 2015-11-16 MED ORDER — CLONAZEPAM 1 MG PO TABS: ORAL_TABLET | ORAL | Status: DC

## 2015-11-16 MED ORDER — ZOLPIDEM TARTRATE ER 12.5 MG PO TBCR: EXTENDED_RELEASE_TABLET | ORAL | Status: DC

## 2015-11-19 ENCOUNTER — Other Ambulatory Visit: Payer: Self-pay | Admitting: Internal Medicine

## 2015-11-20 ENCOUNTER — Other Ambulatory Visit: Payer: Self-pay | Admitting: Internal Medicine

## 2015-11-21 ENCOUNTER — Other Ambulatory Visit: Payer: Self-pay

## 2015-11-21 ENCOUNTER — Telehealth: Payer: Self-pay

## 2015-11-21 MED ORDER — ZOLPIDEM TARTRATE ER 12.5 MG PO TBCR: EXTENDED_RELEASE_TABLET | ORAL | Status: DC

## 2015-11-21 NOTE — Telephone Encounter (Signed)
Patient called about her Ambien  medication that pharmacy said needed refilled but I let patient it had been fill on 11-16-2015

## 2015-11-21 NOTE — Telephone Encounter (Signed)
Pharmacy called back to say they never received the first fax for Ambien so I told her I would print another and to disregard the old one if it showed up new one printed

## 2015-12-17 ENCOUNTER — Other Ambulatory Visit: Payer: Self-pay | Admitting: Internal Medicine

## 2016-01-17 ENCOUNTER — Other Ambulatory Visit: Payer: Self-pay | Admitting: *Deleted

## 2016-01-17 MED ORDER — CLONAZEPAM 1 MG PO TABS: ORAL_TABLET | ORAL | Status: DC

## 2016-01-17 MED ORDER — ZOLPIDEM TARTRATE ER 12.5 MG PO TBCR: EXTENDED_RELEASE_TABLET | ORAL | Status: DC

## 2016-02-15 ENCOUNTER — Other Ambulatory Visit: Payer: Self-pay | Admitting: Internal Medicine

## 2016-03-18 ENCOUNTER — Other Ambulatory Visit: Payer: Self-pay | Admitting: Internal Medicine

## 2016-04-18 ENCOUNTER — Other Ambulatory Visit: Payer: Self-pay | Admitting: Internal Medicine

## 2016-05-15 ENCOUNTER — Other Ambulatory Visit: Payer: Self-pay | Admitting: Internal Medicine

## 2016-06-12 ENCOUNTER — Telehealth: Payer: Self-pay | Admitting: *Deleted

## 2016-06-12 ENCOUNTER — Ambulatory Visit (INDEPENDENT_AMBULATORY_CARE_PROVIDER_SITE_OTHER): Payer: Federal, State, Local not specified - PPO | Admitting: Internal Medicine

## 2016-06-12 ENCOUNTER — Encounter: Payer: Self-pay | Admitting: Internal Medicine

## 2016-06-12 VITALS — BP 118/70 | HR 73 | Temp 98.2°F | Ht 66.0 in | Wt 127.0 lb

## 2016-06-12 DIAGNOSIS — G4700 Insomnia, unspecified: Secondary | ICD-10-CM

## 2016-06-12 DIAGNOSIS — E039 Hypothyroidism, unspecified: Secondary | ICD-10-CM | POA: Diagnosis not present

## 2016-06-12 DIAGNOSIS — Z0000 Encounter for general adult medical examination without abnormal findings: Secondary | ICD-10-CM | POA: Diagnosis not present

## 2016-06-12 DIAGNOSIS — N898 Other specified noninflammatory disorders of vagina: Secondary | ICD-10-CM

## 2016-06-12 DIAGNOSIS — I10 Essential (primary) hypertension: Secondary | ICD-10-CM | POA: Diagnosis not present

## 2016-06-12 DIAGNOSIS — K22719 Barrett's esophagus with dysplasia, unspecified: Secondary | ICD-10-CM | POA: Diagnosis not present

## 2016-06-12 DIAGNOSIS — M810 Age-related osteoporosis without current pathological fracture: Secondary | ICD-10-CM

## 2016-06-12 MED ORDER — HYLAFEM VA SUPP: 1.0000 | Freq: Every day | VAGINAL | Status: DC | PRN

## 2016-06-12 NOTE — Telephone Encounter (Signed)
Spoke with patient and she will get labs tomorrow.

## 2016-06-12 NOTE — Telephone Encounter (Signed)
-----   Message from Gayland Curry, DO sent at 06/10/2016 11:56 AM EDT ----- Per my last note, pt was to come for her annual with labs that day from what I see.  Unfortunately, she was given an afternoon appt and a lab appt before.  She called about it today b/c no labs were ordered.  Please call her and ask if she does want to come for labs before her appt and I will order cbc, cmp, flp, tsh if she does.  Otherwise, we can do her annual and I can order them for later.

## 2016-06-12 NOTE — Progress Notes (Signed)
Location:  [facility] Provider: Maesyn Frisinger L. Mariea Clonts, D.O., C.M.D.  Patient Care Team: Gayland Curry, DO as PCP - General (Geriatric Medicine) [place], [facility])  Extended Emergency Contact Information Primary Emergency Contact: Gaddie,WILLIAM Address: [address]          [place],  [place] Phone: [phone] Relation: None  Goals of Care: Advanced Directive information Advanced Directives 06/12/2016  Does patient have an advance directive? Yes  Type of Advance Directive [place]  Does patient want to make changes to advanced directive? -  Copy of advanced directive(s) in chart? Yes     Chief Complaint  Patient presents with  . Annual Exam    physical exam    HPI: Patient is a 65 y.o. female seen in today for an annual wellness exam.    Hepatitis C screening negative in 2014.  Had herpes test that was positive indicating prior exposure.    She lost 7 lbs.  Has been exercising and eating right.  Depression screen PHQ 2/9 06/12/2016  Decreased Interest 0  Down, Depressed, Hopeless 0  PHQ - 2 Score 0    Fall Risk  06/12/2016 06/11/2015 06/09/2014  Falls in the past year? No No No   Health Maintenance  Topic Date Due  . HIV Screening  01/17/1966  . INFLUENZA VACCINE  07/29/2016  . PAP SMEAR  06/09/2017  . MAMMOGRAM  08/14/2017  . TETANUS/TDAP  11/24/2021  . COLONOSCOPY  08/24/2024  . ZOSTAVAX  Completed  . Hepatitis C Screening  Completed   Urinary incontinence?  No difficulty here.  No leakage or urgency. Exercise? Riding her bike, running.  Very active.  Has lost weight. Diet? Tries to eat chicken and fish and low fat lactose free diet, also avoids spicy foods, eat lots of fruits and veggies Vision Screening Comments: [place], march 2017 wears glasses Hearing:  No problem Dentition:  No problems and sees dentist annually Pain:  No pains whatsoever. Knees sound like crackers when she does squats.    Past Medical  History  Diagnosis Date  . Senile osteoporosis   . Oral aphthae   . Unspecified hypothyroidism   . Cystocele, midline   . Obstructive sleep apnea (adult) (pediatric)   . Insomnia, unspecified   . Unspecified essential hypertension   . Encounter for long-term (current) use of other medications   . Proteinuria   . Glycosuria   . Other and unspecified hyperlipidemia   . Viral hepatitis A without mention of hepatic coma   . Anxiety state, unspecified   . Depressive disorder, not elsewhere classified   . Rosacea   . Reflux esophagitis   . Barrett's esophagus   . STD (sexually transmitted disease)     HSV2    Past Surgical History  Procedure Laterality Date  . Tooth implant  [id number]  . Cosmetic surgery  2009    for protruding ears  . Breast enhancement surgery  2010  . Ganglion cyst excision  2011    right palm  . Tummy tuck      Social History   Social History  . Marital Status: Divorced    Spouse Name: N/A  . Number of Children: N/A  . Years of Education: N/A   Occupational History  . Not on file.   Social History Main Topics  . Smoking status: Never Smoker   . Smokeless tobacco: Never Used  . Alcohol Use: No  . Drug Use: No  . Sexual  Activity:    Partners: Male    Museum/gallery curator: None   Other Topics Concern  . Not on file   Social History Narrative    Allergies  Allergen Reactions  . Adhesive [Tape]   . Lactose Intolerance (Gi)   . Sulfa Antibiotics       Medication List       This list is accurate as of: 06/12/16  2:11 PM.  Always use your most recent med list.               ARMOUR THYROID 60 MG tablet  Generic drug:  thyroid  TAKE 1 TABLET ONCE DAILY FOR THYROID.     cholecalciferol 1000 units tablet  Commonly known as:  VITAMIN D  Take 1,000 Units by mouth daily.     clonazePAM 1 MG tablet  Commonly known as:  KLONOPIN  TAKE 1 TABLET 3-4 TIMES DAILY FOR PANIC DISORDER.     ESTRACE VAGINAL 0.1 MG/GM vaginal cream    Generic drug:  estradiol  INSERT 1 GRAM VAGINALLY TWICE WEEKLY.     fish oil-omega-3 fatty acids 1000 MG capsule  Take 1,400 mg by mouth daily.     metroNIDAZOLE 1 % gel  Commonly known as:  METROGEL  Apply to affected area on face for rosacea, avoid contact with eyes     omeprazole 20 MG capsule  Commonly known as:  PRILOSEC  TAKE 1 CAPSULE ONCE DAILY FOR STOMACH ACID.     zolpidem 12.5 MG CR tablet  Commonly known as:  AMBIEN CR  TAKE 1 TABLET AT BEDTIME IF NEEDED FOR SLEEP.         Review of Systems:  Review of Systems  Constitutional: Negative for fever, chills and malaise/fatigue.  HENT: Negative for congestion and hearing loss.   Eyes: Negative for blurred vision.  Respiratory: Negative for shortness of breath.   Cardiovascular: Negative for chest pain and leg swelling.  Gastrointestinal: Negative for abdominal pain.  Genitourinary: Negative for dysuria.       Atrophic vaginitis  Musculoskeletal: Positive for joint pain. Negative for falls.  Skin: Negative for rash.  Neurological: Negative for dizziness, loss of consciousness and weakness.  Endo/Heme/Allergies: Does not bruise/bleed easily.  Psychiatric/Behavioral: Negative for depression and memory loss. The patient is nervous/anxious and has insomnia.     Physical Exam: Filed Vitals:   06/12/16 1400  BP: 118/70  Pulse: 73  Temp: 98.2 F (36.8 C)  TempSrc: Oral  Height: 5\' 6"  (1.676 m)  Weight: 127 lb (57.607 kg)  SpO2: 99%   Body mass index is 20.51 kg/(m^2). Physical Exam  Constitutional: She is oriented to person, place, and time. She appears well-developed and well-nourished. No distress.  HENT:  Head: Normocephalic and atraumatic.  Right Ear: External ear normal.  Left Ear: External ear normal.  Nose: Nose normal.  Mouth/Throat: Oropharynx is clear and moist. No oropharyngeal exudate.  Eyes: Conjunctivae and EOM are normal. Pupils are equal, round, and reactive to light.  Neck: Normal range  of motion. Neck supple. No JVD present.  Cardiovascular: Normal rate, regular rhythm, normal heart sounds and intact distal pulses.   Pulmonary/Chest: Effort normal and breath sounds normal. No respiratory distress. Right breast exhibits no inverted nipple, no mass, no nipple discharge, no skin change and no tenderness. Left breast exhibits no inverted nipple, no mass, no nipple discharge, no skin change and no tenderness.  Abdominal: Soft. Bowel sounds are normal. She exhibits no distension and no mass. There  is no tenderness.  Musculoskeletal: Normal range of motion. She exhibits no tenderness.  Knees with crepitus  Lymphadenopathy:    She has no cervical adenopathy.  Neurological: She is alert and oriented to person, place, and time. She has normal reflexes.  Skin: Skin is warm and dry.  Psychiatric: She has a normal mood and affect. Her behavior is normal. Judgment and thought content normal.    Labs reviewed: Basic Metabolic Panel:  Recent Labs  06/18/15 1058  NA 145*  K 4.0  CL 104  CO2 23  GLUCOSE 111*  BUN 12  CREATININE 0.78  CALCIUM 9.1  TSH 6.980*   Liver Function Tests:  Recent Labs  06/18/15 1058  AST 15  ALT 11  ALKPHOS 54  BILITOT 0.6  PROT 7.0  ALBUMIN 4.5   No results for input(s): LIPASE, AMYLASE in the last 8760 hours. No results for input(s): AMMONIA in the last 8760 hours. CBC:  Recent Labs  06/18/15 1058  WBC 3.1*  NEUTROABS 1.4  HCT 42.2  MCV 99*  PLT 328   Lipid Panel:  Recent Labs  06/18/15 1058  CHOL 244*  HDL 99  LDLCALC 134*  TRIG 56  CHOLHDL 2.5   Assessment/Plan 1. Encounter for annual physical exam - needs prevnar next year, other shots up to date -does not smoke - counseled on diet and exercise and does great with this - CBC with Differential/Platelet; Future - Comprehensive metabolic panel; Future - Hemoglobin A1c; Future - Lipid panel; Future - HIV antibody; Future - TSH; Future  2. Essential  hypertension -bp at goal w/o meds - EKG 12-Lead - Comprehensive metabolic panel; Future - Lipid panel; Future  3. Barrett's esophagus with dysplasia -cont on prilosec, no problems  4. Senile osteoporosis -bone density was 7/15, is on vitamin D and follows healthy diet and does weightbearing exercise  5. Insomnia -continues on ambien w/ benefit when needed  6. Hypothyroidism, unspecified hypothyroidism type -cont current thyroid medication, levels normal  Labs/tests ordered:   Orders Placed This Encounter  Procedures  . CBC with Differential/Platelet    Standing Status: Future     Number of Occurrences: 1     Standing Expiration Date: 08/28/2016  . Comprehensive metabolic panel    Standing Status: Future     Number of Occurrences: 1     Standing Expiration Date: 08/28/2016    Order Specific Question:  Has the patient fasted?    Answer:  Yes  . Hemoglobin A1c    Standing Status: Future     Number of Occurrences: 1     Standing Expiration Date: 08/28/2016  . Lipid panel    Standing Status: Future     Number of Occurrences: 1     Standing Expiration Date: 08/28/2016    Order Specific Question:  Has the patient fasted?    Answer:  Yes  . HIV antibody    Standing Status: Future     Number of Occurrences: 1     Standing Expiration Date: 08/28/2016  . TSH    Standing Status: Future     Number of Occurrences: 1     Standing Expiration Date: 08/28/2016  . EKG 12-Lead   . Next appt:  1 year for annual wellness  Rebie Peale L. Urian Martenson, D.O. [facility] [address], [place] [address] Cell Phone (Mon-Fri 8am-5pm):  [phone] On Call:  [phone] & follow prompts after 5pm & weekends Office Phone:  [phone] Office  Fax:  [phone]

## 2016-06-13 ENCOUNTER — Other Ambulatory Visit: Payer: Federal, State, Local not specified - PPO

## 2016-06-13 DIAGNOSIS — I10 Essential (primary) hypertension: Secondary | ICD-10-CM

## 2016-06-13 DIAGNOSIS — Z0000 Encounter for general adult medical examination without abnormal findings: Secondary | ICD-10-CM

## 2016-06-14 ENCOUNTER — Encounter: Payer: Self-pay | Admitting: Internal Medicine

## 2016-06-14 LAB — COMPREHENSIVE METABOLIC PANEL
ALT: 11 IU/L (ref 0–32)
AST: 18 IU/L (ref 0–40)
Albumin/Globulin Ratio: 2.1 (ref 1.2–2.2)
Albumin: 4.6 g/dL (ref 3.6–4.8)
Alkaline Phosphatase: 57 IU/L (ref 39–117)
BUN/Creatinine Ratio: 26 (ref 12–28)
BUN: 19 mg/dL (ref 8–27)
Bilirubin Total: 0.8 mg/dL (ref 0.0–1.2)
CO2: 25 mmol/L (ref 18–29)
Calcium: 9.5 mg/dL (ref 8.7–10.3)
Chloride: 101 mmol/L (ref 96–106)
Creatinine, Ser: 0.72 mg/dL (ref 0.57–1.00)
GFR calc Af Amer: 102 mL/min/{1.73_m2} (ref >59)
GFR calc non Af Amer: 89 mL/min/{1.73_m2} (ref >59)
Globulin, Total: 2.2 g/dL (ref 1.5–4.5)
Glucose: 104 mg/dL — ABNORMAL HIGH (ref 65–99)
Potassium: 4.8 mmol/L (ref 3.5–5.2)
Sodium: 141 mmol/L (ref 134–144)
Total Protein: 6.8 g/dL (ref 6.0–8.5)

## 2016-06-14 LAB — CBC WITH DIFFERENTIAL/PLATELET
Basophils Absolute: 0 10*3/uL (ref 0.0–0.2)
Basos: 0 %
EOS (ABSOLUTE): 0.1 10*3/uL (ref 0.0–0.4)
Eos: 2 %
Hematocrit: 41.6 % (ref 34.0–46.6)
Hemoglobin: 14.1 g/dL (ref 11.1–15.9)
Immature Grans (Abs): 0 10*3/uL (ref 0.0–0.1)
Immature Granulocytes: 0 %
Lymphocytes Absolute: 1.1 10*3/uL (ref 0.7–3.1)
Lymphs: 37 %
MCH: 32.9 pg (ref 26.6–33.0)
MCHC: 33.9 g/dL (ref 31.5–35.7)
MCV: 97 fL (ref 79–97)
Monocytes Absolute: 0.4 10*3/uL (ref 0.1–0.9)
Monocytes: 11 %
Neutrophils Absolute: 1.5 10*3/uL (ref 1.4–7.0)
Neutrophils: 50 %
Platelets: 319 10*3/uL (ref 150–379)
RBC: 4.29 x10E6/uL (ref 3.77–5.28)
RDW: 12.7 % (ref 12.3–15.4)
WBC: 3.1 10*3/uL — ABNORMAL LOW (ref 3.4–10.8)

## 2016-06-14 LAB — LIPID PANEL
Chol/HDL Ratio: 2.6 ratio units (ref 0.0–4.4)
Cholesterol, Total: 203 mg/dL — ABNORMAL HIGH (ref 100–199)
HDL: 78 mg/dL (ref >39)
LDL Calculated: 110 mg/dL — ABNORMAL HIGH (ref 0–99)
Triglycerides: 76 mg/dL (ref 0–149)
VLDL Cholesterol Cal: 15 mg/dL (ref 5–40)

## 2016-06-14 LAB — HEMOGLOBIN A1C
Est. average glucose Bld gHb Est-mCnc: 97 mg/dL
Hgb A1c MFr Bld: 5 % (ref 4.8–5.6)

## 2016-06-14 LAB — HIV ANTIBODY (ROUTINE TESTING W REFLEX): HIV Screen 4th Generation wRfx: NONREACTIVE

## 2016-06-14 LAB — TSH: TSH: 2.36 u[IU]/mL (ref 0.450–4.500)

## 2016-06-15 ENCOUNTER — Other Ambulatory Visit: Payer: Self-pay | Admitting: Internal Medicine

## 2016-06-16 ENCOUNTER — Encounter: Payer: Self-pay | Admitting: *Deleted

## 2016-06-18 MED ORDER — HYLAFEM VA SUPP: 1.0000 | Freq: Every day | VAGINAL | Status: DC | PRN

## 2016-07-15 ENCOUNTER — Other Ambulatory Visit: Payer: Self-pay | Admitting: Internal Medicine

## 2016-07-16 ENCOUNTER — Other Ambulatory Visit: Payer: Self-pay | Admitting: *Deleted

## 2016-07-16 MED ORDER — CLONAZEPAM 1 MG PO TABS: ORAL_TABLET | ORAL | Status: DC

## 2016-07-16 MED ORDER — ZOLPIDEM TARTRATE ER 12.5 MG PO TBCR: EXTENDED_RELEASE_TABLET | ORAL | Status: DC

## 2016-07-16 NOTE — Telephone Encounter (Signed)
[facility]  

## 2016-08-15 LAB — HM MAMMOGRAPHY

## 2016-08-15 LAB — HM DEXA SCAN

## 2016-08-18 ENCOUNTER — Encounter: Payer: Self-pay | Admitting: Internal Medicine

## 2016-08-20 ENCOUNTER — Other Ambulatory Visit: Payer: Self-pay | Admitting: Internal Medicine

## 2016-08-20 MED ORDER — ZOLPIDEM TARTRATE ER 12.5 MG PO TBCR: EXTENDED_RELEASE_TABLET | ORAL | 0 refills | Status: DC

## 2016-08-20 NOTE — Telephone Encounter (Signed)
Dr.Reed please advise on refill request, I am not sure if this is a maintenance medication for this patient.  If ok to fill please advise on number of refills allowed

## 2016-08-21 ENCOUNTER — Other Ambulatory Visit: Payer: Self-pay | Admitting: Internal Medicine

## 2016-08-21 ENCOUNTER — Encounter: Payer: Self-pay | Admitting: Internal Medicine

## 2016-08-26 ENCOUNTER — Encounter: Payer: Self-pay | Admitting: *Deleted

## 2016-08-28 ENCOUNTER — Encounter: Payer: Self-pay | Admitting: Internal Medicine

## 2016-08-28 ENCOUNTER — Ambulatory Visit (INDEPENDENT_AMBULATORY_CARE_PROVIDER_SITE_OTHER): Payer: Federal, State, Local not specified - PPO | Admitting: Internal Medicine

## 2016-08-28 VITALS — BP 128/68 | HR 90 | Temp 98.6°F | Ht 66.0 in | Wt 128.0 lb

## 2016-08-28 DIAGNOSIS — E039 Hypothyroidism, unspecified: Secondary | ICD-10-CM | POA: Diagnosis not present

## 2016-08-28 DIAGNOSIS — N898 Other specified noninflammatory disorders of vagina: Secondary | ICD-10-CM

## 2016-08-28 DIAGNOSIS — M810 Age-related osteoporosis without current pathological fracture: Secondary | ICD-10-CM | POA: Diagnosis not present

## 2016-08-28 DIAGNOSIS — Z23 Encounter for immunization: Secondary | ICD-10-CM

## 2016-08-28 DIAGNOSIS — K219 Gastro-esophageal reflux disease without esophagitis: Secondary | ICD-10-CM | POA: Diagnosis not present

## 2016-08-28 NOTE — Progress Notes (Signed)
Location:  [facility] clinic Provider: Avon Molock L. Mariea Clonts, D.O., C.M.D.  Code Status: full code Goals of Care:  Advanced Directives 08/28/2016  Does patient have an advance directive? Yes  Type of Advance Directive Living will  Does patient want to make changes to advanced directive? -  Copy of advanced directive(s) in chart? Yes   Chief Complaint  Patient presents with  . Medical Management of Chronic Issues    discuss medications for bone loss, discuss urgency to urinate    HPI: Patient is a 65 y.o. female seen today for an acute visit for f/u on osteoporosis and pain after sex.  Had been using estrace and then used hylafem.    2 days ago, she went to [facility] dermatology where they removed several of her vanity spots.    Has had a companion for 4 years now.  Every time after sex, she has honeymoon cystitis that then goes away.  Uses the estrace cream.  She had menopause at 35, had a lot of stress about mom who she looked after with AD, thyroid disease, then lost the weight.  Weight was 150, now down to 128.  She had breast implants and a tummy tuck.  When he did it pulled some things up.  Urethra is higher up in the front.  Has only had the post-intercourse discomfort since then.    Reports she's been weighing a little more since drinking beer over the past year.  She has tried to cut back now.  She will only have 1-2 beers on the weekends.  Wt stable.    Senile osteoporosis:  Since June, she is diligently doing 20-30 mins on treadmill, yoga, stretches, 100 crunches, planks and pushups.  Discussed adding light weights.  Has taken fosamax, also boniva.  Stopped it at some point.  She's scared now.    Had barrett's esophagus when she was having gerd.  She had been eating fatty foods, a lot of acidic foods.  She is avoiding all of that.  EGD 2015 was negative for the barrett's and still on prilosec.    Past Medical History:  Diagnosis Date  . Anxiety state, unspecified   . Barrett's  esophagus   . Cystocele, midline   . Depressive disorder, not elsewhere classified   . Encounter for long-term (current) use of other medications   . Glycosuria   . Insomnia, unspecified   . Obstructive sleep apnea (adult) (pediatric)   . Oral aphthae   . Other and unspecified hyperlipidemia   . Proteinuria   . Reflux esophagitis   . Rosacea   . Senile osteoporosis   . STD (sexually transmitted disease)    HSV2  . Unspecified essential hypertension   . Unspecified hypothyroidism   . Viral hepatitis A without mention of hepatic coma     Past Surgical History:  Procedure Laterality Date  . BREAST ENHANCEMENT SURGERY  2010  . COSMETIC SURGERY  2009   for protruding ears  . GANGLION CYST EXCISION  2011   right palm  . tooth implant  [id number]  . tummy tuck      Allergies  Allergen Reactions  . Adhesive [Tape]   . Lactose Intolerance (Gi)   . Sulfa Antibiotics       Medication List       Accurate as of 08/28/16  3:05 PM. Always use your most recent med list.          ARMOUR THYROID 60 MG tablet Generic drug:  thyroid  TAKE 1 TABLET ONCE DAILY FOR THYROID.   aspirin 500 MG EC tablet Take 500 mg by mouth every 6 (six) hours as needed for pain.   calcium citrate-vitamin D 315-200 MG-UNIT tablet Commonly known as:  CITRACAL+D Take 1 tablet by mouth 2 (two) times daily.   cholecalciferol 1000 units tablet Commonly known as:  VITAMIN D Take 1,000 Units by mouth daily.   clonazePAM 1 MG tablet Commonly known as:  KLONOPIN TAKE 1 TABLET 3-4 TIMES DAILY FOR PANIC DISORDER.   ESTRACE VAGINAL 0.1 MG/GM vaginal cream Generic drug:  estradiol INSERT 1 GRAM VAGINALLY TWICE WEEKLY.   fish oil-omega-3 fatty acids 1000 MG capsule Take 1,400 mg by mouth daily.   HYLAFEM Supp INSERT 1 SUPPOSITORY VAGINALLY ONCE DAILY AS NEEDED.   Krill Oil 500 MG Caps Take by mouth daily.   metroNIDAZOLE 1 % gel Commonly known as:  METROGEL Apply to affected area on face for  rosacea, avoid contact with eyes   omeprazole 20 MG capsule Commonly known as:  PRILOSEC TAKE 1 CAPSULE ONCE DAILY FOR STOMACH ACID.   PROBIOTIC DAILY PO Take by mouth daily.   SUPER BIOTIN 5 MG Caps Generic drug:  Biotin Take by mouth daily.   zolpidem 12.5 MG CR tablet Commonly known as:  AMBIEN CR Take one tablet by mouth at bedtime as needed for sleep       Review of Systems:  Review of Systems  Constitutional: Negative for chills and fever.  Respiratory: Negative for shortness of breath.   Cardiovascular: Negative for chest pain.  Gastrointestinal: Negative for abdominal pain.  Genitourinary: Positive for dysuria and urgency. Negative for flank pain, frequency and hematuria.  Musculoskeletal: Negative for falls.  Skin: Negative for itching and rash.  Neurological: Negative for dizziness.  Psychiatric/Behavioral: Negative for depression. The patient is nervous/anxious.     Health Maintenance  Topic Date Due  . INFLUENZA VACCINE  07/29/2016  . PAP SMEAR  06/09/2017  . MAMMOGRAM  08/15/2018  . TETANUS/TDAP  11/24/2021  . COLONOSCOPY  08/24/2024  . ZOSTAVAX  Completed  . Hepatitis C Screening  Completed  . HIV Screening  Completed    Physical Exam: Vitals:   08/28/16 1444  BP: 128/68  Pulse: 90  Temp: 98.6 F (37 C)  TempSrc: Oral  SpO2: 97%  Weight: 128 lb (58.1 kg)  Height: 5\' 6"  (1.676 m)   Body mass index is 20.66 kg/m. Physical Exam  Constitutional: She is oriented to person, place, and time. She appears well-developed and well-nourished. No distress.  Cardiovascular: Normal rate, regular rhythm, normal heart sounds and intact distal pulses.   Pulmonary/Chest: Effort normal and breath sounds normal.  Musculoskeletal: Normal range of motion.  Neurological: She is alert and oriented to person, place, and time.  Skin: Skin is warm and dry.    Labs reviewed: Basic Metabolic Panel:  Recent Labs  06/13/16 1122  NA 141  K 4.8  CL 101  CO2 25   GLUCOSE 104*  BUN 19  CREATININE 0.72  CALCIUM 9.5  TSH 2.360   Liver Function Tests:  Recent Labs  06/13/16 1122  AST 18  ALT 11  ALKPHOS 57  BILITOT 0.8  PROT 6.8  ALBUMIN 4.6   No results for input(s): LIPASE, AMYLASE in the last 8760 hours. No results for input(s): AMMONIA in the last 8760 hours. CBC:  Recent Labs  06/13/16 1122  WBC 3.1*  NEUTROABS 1.5  HCT 41.6  MCV 97  PLT 319  Lipid Panel:  Recent Labs  06/13/16 1122  CHOL 203*  HDL 78  LDLCALC 110*  TRIG 76  CHOLHDL 2.6   Lab Results  Component Value Date   HGBA1C 5.0 06/13/2016    Procedures since last visit: Reviewed bone density and mammogram with her  Assessment/Plan 1. Senile osteoporosis -she will check with her insurance and let me know if they will cover prolia for her -cont weightbearing exercise, ca with D bid and additional D3  2. Vaginal irritation -seems her urethra is located more prominently and gets excoriated during intercourse -she is going to continue to use the estrace and also hylafem post-intercourse, but will try different positions to help with preventing discomfort  3. Hypothyroidism, unspecified hypothyroidism type -cont current thyroid medication and monitor  4. Gastroesophageal reflux disease without esophagitis -advised she may d/c prilosec which should help her bones considering she is following a low acidity diet and no longer had barrett's changes 2 years ago on EGD -will monitor--may use zantac or tums if she does have indigestion  5. Need for prophylactic vaccination and inoculation against influenza - Flu Vaccine QUAD 36+ mos PF IM (Fluarix & Fluzone Quad PF)  Labs/tests ordered:   Orders Placed This Encounter  Procedures  . Flu Vaccine QUAD 36+ mos PF IM (Fluarix & Fluzone Quad PF)    Next appt:  Keep already scheduled visit  Jamae Tison L. Clariza Sickman, D.O. [facility] [address], [place]  [address] Cell Phone (Mon-Fri 8am-5pm):  [phone] On Call:  [phone] & follow prompts after 5pm & weekends Office Phone:  [phone] Office Fax:  [phone]

## 2016-08-28 NOTE — Patient Instructions (Signed)
Please check with your insurance about prolia coverage.

## 2016-08-29 ENCOUNTER — Encounter: Payer: Self-pay | Admitting: Internal Medicine

## 2016-09-16 ENCOUNTER — Other Ambulatory Visit: Payer: Self-pay | Admitting: Internal Medicine

## 2016-09-16 ENCOUNTER — Other Ambulatory Visit: Payer: Self-pay | Admitting: Nurse Practitioner

## 2016-09-17 ENCOUNTER — Telehealth: Payer: Self-pay

## 2016-09-17 ENCOUNTER — Other Ambulatory Visit: Payer: Self-pay

## 2016-09-17 NOTE — Telephone Encounter (Signed)
I called [phone] to initiate PA for Zolpidem 12.5 tablet. ID# [id number], Diagnosis G47.00 (insomina)  PA Approved through 09/17/2017  I called patient's pharmacy and left them a message informing them RX approved

## 2016-09-17 NOTE — Telephone Encounter (Signed)
Duplicate request received for clonazepam and ambien. Both rx's were approved yesterday

## 2016-10-22 ENCOUNTER — Other Ambulatory Visit: Payer: Self-pay | Admitting: *Deleted

## 2016-10-22 MED ORDER — ZOLPIDEM TARTRATE ER 12.5 MG PO TBCR: EXTENDED_RELEASE_TABLET | ORAL | 0 refills | Status: DC

## 2016-10-22 MED ORDER — CLONAZEPAM 1 MG PO TABS: ORAL_TABLET | ORAL | 0 refills | Status: DC

## 2016-10-22 NOTE — Telephone Encounter (Signed)
[facility]  

## 2016-11-17 ENCOUNTER — Other Ambulatory Visit: Payer: Self-pay | Admitting: Internal Medicine

## 2016-12-15 ENCOUNTER — Other Ambulatory Visit: Payer: Self-pay | Admitting: Nurse Practitioner

## 2016-12-30 DIAGNOSIS — H43812 Vitreous degeneration, left eye: Secondary | ICD-10-CM | POA: Diagnosis not present

## 2016-12-30 DIAGNOSIS — H2513 Age-related nuclear cataract, bilateral: Secondary | ICD-10-CM | POA: Diagnosis not present

## 2016-12-30 DIAGNOSIS — H52221 Regular astigmatism, right eye: Secondary | ICD-10-CM | POA: Diagnosis not present

## 2016-12-30 DIAGNOSIS — H524 Presbyopia: Secondary | ICD-10-CM | POA: Diagnosis not present

## 2016-12-30 DIAGNOSIS — H5203 Hypermetropia, bilateral: Secondary | ICD-10-CM | POA: Diagnosis not present

## 2017-01-15 ENCOUNTER — Other Ambulatory Visit: Payer: Self-pay | Admitting: Internal Medicine

## 2017-01-28 ENCOUNTER — Telehealth: Payer: Self-pay | Admitting: *Deleted

## 2017-01-28 NOTE — Telephone Encounter (Signed)
Received fax from [place] [phone] for patient's Zolpidem Tartrate ER and Armour Thyroid Tablet requesting more information for coverage. Form filled out and faxed back to [place] [facility]: [phone]

## 2017-01-29 ENCOUNTER — Telehealth: Payer: Self-pay

## 2017-01-29 NOTE — Telephone Encounter (Signed)
2 approval letter received from [place] Thyroid approved 12/29/16-01/28/18 Zolpidem Tartrate approved 12/29/16-01/28/18  I called [employer] to inform them of Approvals (fax would not go through)  Both letters sent to Dr.Reed to sign then to scanning

## 2017-01-30 ENCOUNTER — Other Ambulatory Visit: Payer: Self-pay | Admitting: *Deleted

## 2017-01-30 MED ORDER — CLONAZEPAM 1 MG PO TABS: ORAL_TABLET | ORAL | 0 refills | Status: DC

## 2017-01-30 MED ORDER — ZOLPIDEM TARTRATE ER 12.5 MG PO TBCR: EXTENDED_RELEASE_TABLET | ORAL | 0 refills | Status: DC

## 2017-01-30 MED ORDER — THYROID 60 MG PO TABS: ORAL_TABLET | ORAL | 5 refills | Status: DC

## 2017-01-30 NOTE — Telephone Encounter (Signed)
Patient switching pharmacy to [facility].

## 2017-03-14 ENCOUNTER — Other Ambulatory Visit: Payer: Self-pay | Admitting: Internal Medicine

## 2017-03-16 NOTE — Telephone Encounter (Signed)
rx called into pharmacy

## 2017-04-14 ENCOUNTER — Other Ambulatory Visit: Payer: Self-pay | Admitting: *Deleted

## 2017-04-14 MED ORDER — CLONAZEPAM 1 MG PO TABS: ORAL_TABLET | ORAL | 0 refills | Status: DC

## 2017-04-14 MED ORDER — ZOLPIDEM TARTRATE ER 12.5 MG PO TBCR: 12.5000 mg | EXTENDED_RELEASE_TABLET | Freq: Every evening | ORAL | 0 refills | Status: DC | PRN

## 2017-04-14 NOTE — Telephone Encounter (Signed)
Patient called and requested Rx to be sent to [facility].

## 2017-04-21 ENCOUNTER — Encounter: Payer: Self-pay | Admitting: Internal Medicine

## 2017-05-13 ENCOUNTER — Other Ambulatory Visit: Payer: Self-pay | Admitting: Nurse Practitioner

## 2017-05-22 DIAGNOSIS — D2272 Melanocytic nevi of left lower limb, including hip: Secondary | ICD-10-CM | POA: Diagnosis not present

## 2017-05-22 DIAGNOSIS — D2239 Melanocytic nevi of other parts of face: Secondary | ICD-10-CM | POA: Diagnosis not present

## 2017-05-22 DIAGNOSIS — L820 Inflamed seborrheic keratosis: Secondary | ICD-10-CM | POA: Diagnosis not present

## 2017-05-22 DIAGNOSIS — D225 Melanocytic nevi of trunk: Secondary | ICD-10-CM | POA: Diagnosis not present

## 2017-05-22 DIAGNOSIS — L814 Other melanin hyperpigmentation: Secondary | ICD-10-CM | POA: Diagnosis not present

## 2017-05-22 DIAGNOSIS — D1801 Hemangioma of skin and subcutaneous tissue: Secondary | ICD-10-CM | POA: Diagnosis not present

## 2017-06-16 ENCOUNTER — Other Ambulatory Visit: Payer: Self-pay | Admitting: Internal Medicine

## 2017-06-18 ENCOUNTER — Encounter: Payer: Self-pay | Admitting: Internal Medicine

## 2017-06-18 ENCOUNTER — Ambulatory Visit (INDEPENDENT_AMBULATORY_CARE_PROVIDER_SITE_OTHER): Payer: Medicare Other | Admitting: Internal Medicine

## 2017-06-18 VITALS — BP 120/70 | HR 71 | Temp 97.6°F | Ht 66.0 in | Wt 123.0 lb

## 2017-06-18 DIAGNOSIS — Z1159 Encounter for screening for other viral diseases: Secondary | ICD-10-CM | POA: Insufficient documentation

## 2017-06-18 DIAGNOSIS — M810 Age-related osteoporosis without current pathological fracture: Secondary | ICD-10-CM | POA: Diagnosis not present

## 2017-06-18 DIAGNOSIS — Z23 Encounter for immunization: Secondary | ICD-10-CM

## 2017-06-18 DIAGNOSIS — R739 Hyperglycemia, unspecified: Secondary | ICD-10-CM | POA: Diagnosis not present

## 2017-06-18 DIAGNOSIS — Z0000 Encounter for general adult medical examination without abnormal findings: Secondary | ICD-10-CM | POA: Diagnosis not present

## 2017-06-18 DIAGNOSIS — Z01419 Encounter for gynecological examination (general) (routine) without abnormal findings: Secondary | ICD-10-CM

## 2017-06-18 DIAGNOSIS — E785 Hyperlipidemia, unspecified: Secondary | ICD-10-CM | POA: Diagnosis not present

## 2017-06-18 DIAGNOSIS — I10 Essential (primary) hypertension: Secondary | ICD-10-CM

## 2017-06-18 DIAGNOSIS — L719 Rosacea, unspecified: Secondary | ICD-10-CM | POA: Insufficient documentation

## 2017-06-18 DIAGNOSIS — Z9189 Other specified personal risk factors, not elsewhere classified: Secondary | ICD-10-CM | POA: Diagnosis not present

## 2017-06-18 DIAGNOSIS — E039 Hypothyroidism, unspecified: Secondary | ICD-10-CM

## 2017-06-18 LAB — CBC WITH DIFFERENTIAL/PLATELET
Basophils Absolute: 0 cells/uL (ref 0–200)
Basophils Relative: 0 %
Eosinophils Absolute: 30 cells/uL (ref 15–500)
Eosinophils Relative: 1 %
HCT: 43.3 % (ref 35.0–45.0)
Hemoglobin: 14.6 g/dL (ref 11.7–15.5)
Lymphocytes Relative: 31 %
Lymphs Abs: 930 cells/uL (ref 850–3900)
MCH: 32.9 pg (ref 27.0–33.0)
MCHC: 33.7 g/dL (ref 32.0–36.0)
MCV: 97.5 fL (ref 80.0–100.0)
MPV: 11.3 fL (ref 7.5–12.5)
Monocytes Absolute: 270 cells/uL (ref 200–950)
Monocytes Relative: 9 %
Neutro Abs: 1770 cells/uL (ref 1500–7800)
Neutrophils Relative %: 59 %
Platelets: 264 10*3/uL (ref 140–400)
RBC: 4.44 MIL/uL (ref 3.80–5.10)
RDW: 12.2 % (ref 11.0–15.0)
WBC: 3 10*3/uL — ABNORMAL LOW (ref 3.8–10.8)

## 2017-06-18 LAB — TSH: TSH: 0.79 mIU/L

## 2017-06-18 MED ORDER — METRONIDAZOLE 1 % EX CREA: TOPICAL_CREAM | Freq: Every day | CUTANEOUS | 3 refills | Status: DC

## 2017-06-18 NOTE — Progress Notes (Signed)
Location:   [facility]  Place of Service:   clinic Provider: Haeli Gerlich L. Mariea Clonts, D.O., C.M.D. Patient Care Team: Gayland Curry, DO as PCP - General (Geriatric Medicine) [place], [place], [place] [facility])  Extended Emergency Contact Information Primary Emergency Contact: Devin,WILLIAM Address: [address]          [place],  [place] Home Phone: [phone] Relation: None  Code Status: DNR Goals of Care: Advanced Directive information Advanced Directives 06/18/2017  Does Patient Have a Medical Advance Directive? Yes  Type of Paramedic of [place];Living will  Does patient want to make changes to medical advance directive? -  Copy of [place] in Chart? Yes   Chief Complaint  Patient presents with  . Annual Exam    wellness  . MMSE    30/30 passed clock    HPI: Patient is a 66 y.o. female seen in today for an annual wellness exam.    Has occasional rosacea flare-ups.  She requests her metrogel be changed to metronidazole cream for her face as she is aging.    Hyperlipidemia/hyperglycemia:  She is exercising moderatey 6/7 days per week. Uses treadmill, does yoga and does situps and aerobic exercises.  She is now wanting to know how her labs stand for her cholesterol and sugar.    She requests the hepatitis C screen to be done after her husband was not faithful.  She is also a Copywriter, advertising.   She is exercising, taking vitamin d for her osteoporosis.  Depression screen [facility] 2/9 06/18/2017 08/28/2016 06/12/2016  Decreased Interest 0 0 0  Down, Depressed, Hopeless 0 0 0  PHQ - 2 Score 0 0 0    Fall Risk  06/18/2017 08/28/2016 06/12/2016 06/11/2015 06/09/2014  Falls in the past year? No No No No No   MMSE - Mini Mental State Exam 06/18/2017  Orientation to time 5  Orientation to Place 5  Registration 3  Attention/ Calculation 5  Recall 3  Language- name 2 objects 2  Language- repeat 1  Language- follow 3 step command 3  Language- read &  follow direction 1  Write a sentence 1  Copy design 1  Total score 30     Health Maintenance  Topic Date Due  . PNA vac Low Risk Adult (1 of 2 - PCV13) 01/17/2017  . PAP SMEAR  06/09/2017  . INFLUENZA VACCINE  07/29/2017  . MAMMOGRAM  08/15/2018  . TETANUS/TDAP  11/24/2021  . COLONOSCOPY  08/24/2024  . DEXA SCAN  Completed  . Hepatitis C Screening  Completed  . HIV Screening  Completed   Urinary incontinence?  No incontinence Functional Status Survey: Is the patient deaf or have difficulty hearing?: No Does the patient have difficulty seeing, even when wearing glasses/contacts?: No Does the patient have difficulty concentrating, remembering, or making decisions?: No Does the patient have difficulty walking or climbing stairs?: No Does the patient have difficulty dressing or bathing?: No Does the patient have difficulty doing errands alone such as visiting a doctor's office or shopping?: No Exercise?  See flow sheet Diet? Eating clean No exam data present  Sees ophtho Hearing:  No problems Dentition:  No problems Pain:  No pain  Past Medical History:  Diagnosis Date  . Anxiety state, unspecified   . Barrett's esophagus   . Cystocele, midline   . Depressive disorder, not elsewhere classified   . Encounter for long-term (current) use of other medications   . Glycosuria   .  Insomnia, unspecified   . Obstructive sleep apnea (adult) (pediatric)   . Oral aphthae   . Other and unspecified hyperlipidemia   . Proteinuria   . Reflux esophagitis   . Rosacea   . Senile osteoporosis   . STD (sexually transmitted disease)    HSV2  . Unspecified essential hypertension   . Unspecified hypothyroidism   . Viral hepatitis A without mention of hepatic coma     Past Surgical History:  Procedure Laterality Date  . BREAST ENHANCEMENT SURGERY  2010  . COSMETIC SURGERY  2009   for protruding ears  . GANGLION CYST EXCISION  2011   right palm  . tooth implant  [id number]  . tummy  tuck      Family History  Problem Relation Age of Onset  . Heart attack Father   . Heart attack Paternal Grandfather   . Heart disease Brother   . Breast cancer Mother     Social History   Social History  . Marital status: Divorced    Spouse name: N/A  . Number of children: N/A  . Years of education: N/A   Social History Main Topics  . Smoking status: Never Smoker  . Smokeless tobacco: Never Used  . Alcohol use No  . Drug use: No  . Sexual activity: Yes    Partners: Male    Birth control/ protection: None   Other Topics Concern  . None   Social History Narrative  . None    reports that she has never smoked. She has never used smokeless tobacco. She reports that she does not drink alcohol or use drugs.   Allergies  Allergen Reactions  . Adhesive [Tape]   . Lactose Intolerance (Gi)   . Sulfa Antibiotics     Allergies as of 06/18/2017      Reactions   Adhesive [tape]    Lactose Intolerance (gi)    Sulfa Antibiotics       Medication List       Accurate as of 06/18/17  2:19 PM. Always use your most recent med list.          aspirin 500 MG EC tablet Take 500 mg by mouth every 6 (six) hours as needed for pain.   calcium citrate-vitamin D 315-200 MG-UNIT tablet Commonly known as:  CITRACAL+D Take 1 tablet by mouth 2 (two) times daily.   cholecalciferol 1000 units tablet Commonly known as:  VITAMIN D Take 1,000 Units by mouth daily.   clonazePAM 1 MG tablet Commonly known as:  KLONOPIN TAKE 1 TABLET BY MOUTH 3-4 TIMES DAILY FOR PANIC DISORDER   Krill Oil 500 MG Caps Take by mouth daily.   omeprazole 20 MG tablet Commonly known as:  PRILOSEC OTC Take 20 mg by mouth daily.   PROBIOTIC DAILY PO Take by mouth daily.   thyroid 60 MG tablet Commonly known as:  ARMOUR THYROID Take one tablet by mouth once daily for thyroid   zolpidem 12.5 MG CR tablet Commonly known as:  AMBIEN CR TAKE 1 TABLET AT BEDTIME AS NEEDED        Review of  Systems:  Review of Systems  Constitutional: Negative for activity change and appetite change.  HENT: Negative for congestion and hearing loss.   Eyes: Negative for visual disturbance.  Respiratory: Negative for apnea, chest tightness and shortness of breath.   Cardiovascular: Negative for chest pain, palpitations and leg swelling.  Gastrointestinal: Negative for abdominal pain.  Genitourinary: Negative for dysuria, frequency and  urgency.  Musculoskeletal: Negative for arthralgias, back pain, gait problem, joint swelling, myalgias, neck pain and neck stiffness.  Skin: Negative for color change and pallor.       rosacea  Neurological: Negative for dizziness and weakness.  Hematological: Negative for adenopathy. Does not bruise/bleed easily.  Psychiatric/Behavioral: Negative for agitation, confusion and sleep disturbance.    Physical Exam: Vitals:   06/18/17 1341  BP: 120/70  Pulse: 71  Temp: 97.6 F (36.4 C)  TempSrc: Oral  SpO2: 99%  Weight: 123 lb (55.8 kg)  Height: 5\' 6"  (1.676 m)   Body mass index is 19.85 kg/m. Physical Exam  Constitutional: She is oriented to person, place, and time. She appears well-developed and well-nourished. No distress.  HENT:  Head: Normocephalic and atraumatic.  Right Ear: External ear normal.  Left Ear: External ear normal.  Nose: Nose normal.  Mouth/Throat: Oropharynx is clear and moist.  Eyes: Conjunctivae and EOM are normal. Pupils are equal, round, and reactive to light.  Neck: Normal range of motion. Neck supple. No JVD present. No tracheal deviation present. No thyromegaly present.  Cardiovascular: Normal rate, regular rhythm, normal heart sounds and intact distal pulses.   Pulmonary/Chest: Effort normal and breath sounds normal. No respiratory distress.  Abdominal: Soft. Bowel sounds are normal. She exhibits no distension and no mass. There is no tenderness. There is no rebound and no guarding.  Genitourinary: Vagina normal and uterus  normal. No vaginal discharge found.  Musculoskeletal: Normal range of motion. She exhibits no edema, tenderness or deformity.  Lymphadenopathy:    She has no cervical adenopathy.  Neurological: She is alert and oriented to person, place, and time. She has normal reflexes. No cranial nerve deficit. Coordination normal.  Skin: Skin is warm and dry. She is not diaphoretic. No erythema.  Psychiatric: She has a normal mood and affect. Her behavior is normal. Judgment and thought content normal.    Labs reviewed: Basic Metabolic Panel: No results for input(s): NA, K, CL, CO2, GLUCOSE, BUN, CREATININE, CALCIUM, MG, PHOS, TSH in the last 8760 hours. Liver Function Tests: No results for input(s): AST, ALT, ALKPHOS, BILITOT, PROT, ALBUMIN in the last 8760 hours. No results for input(s): LIPASE, AMYLASE in the last 8760 hours. No results for input(s): AMMONIA in the last 8760 hours. CBC: No results for input(s): WBC, NEUTROABS, HGB, HCT, MCV, PLT in the last 8760 hours. Lipid Panel: No results for input(s): CHOL, HDL, LDLCALC, TRIG, CHOLHDL, LDLDIRECT in the last 8760 hours. Lab Results  Component Value Date   HGBA1C 5.0 06/13/2016    Procedures: EKG:  NSR @72bpm   Assessment/Plan 1. Essential hypertension - bp at goal without meds, but was elevated in the past - EKG 12-Lead normal today - CBC with Differential/Platelet - COMPLETE METABOLIC PANEL WITH GFR  2. Acquired hypothyroidism -cont armour thyroid at pt request and f/u lab: - TSH  3. Senile osteoporosis -cont ca with D and additional D and weightbearing exercises  4. Hyperlipidemia, unspecified hyperlipidemia type -cont krill oil - Lipid panel  5. Encounter for hepatitis C virus screening test for high risk patient - Hepatitis C antibody -pt requests to have test done--discussed that sometimes not covered by insurance  6. Hyperglycemia -pt working on her diet and requests f/u lab: - Hemoglobin A1c  7.  Rosacea -change  to metronidazole cream from gel  8.  Need for prevnar:  Given  9.  Well female exam:  Performed today, pt requested pap, advised that I cannot guarantee that medicare  will cover, but she does have gap coverage also and still wants it done to be safe  Labs/tests ordered:   Orders Placed This Encounter  Procedures  . CBC with Differential/Platelet  . COMPLETE METABOLIC PANEL WITH GFR  . Lipid panel  . TSH  . Hepatitis C antibody  . Hemoglobin A1c  . EKG 12-Lead    Next appt:  1 year for AWV, med mgt   Hai Grabe L. Kawthar Ennen, D.O. [facility] [address], [place] [address] Cell Phone (Mon-Fri 8am-5pm):  [phone] On Call:  [phone] & follow prompts after 5pm & weekends Office Phone:  [phone] Office Fax:  [phone]

## 2017-06-19 LAB — COMPLETE METABOLIC PANEL WITH GFR
ALT: 10 U/L (ref 6–29)
AST: 14 U/L (ref 10–35)
Albumin: 4.4 g/dL (ref 3.6–5.1)
Alkaline Phosphatase: 42 U/L (ref 33–130)
BUN: 16 mg/dL (ref 7–25)
CO2: 29 mmol/L (ref 20–31)
Calcium: 9.9 mg/dL (ref 8.6–10.4)
Chloride: 103 mmol/L (ref 98–110)
Creat: 0.69 mg/dL (ref 0.50–0.99)
GFR, Est African American: >89 mL/min (ref >=60)
GFR, Est Non African American: >89 mL/min (ref >=60)
Glucose, Bld: 85 mg/dL (ref 65–99)
Potassium: 4.9 mmol/L (ref 3.5–5.3)
Sodium: 139 mmol/L (ref 135–146)
Total Bilirubin: 0.5 mg/dL (ref 0.2–1.2)
Total Protein: 6.9 g/dL (ref 6.1–8.1)

## 2017-06-19 LAB — LIPID PANEL
Cholesterol: 193 mg/dL (ref <200)
HDL: 60 mg/dL (ref >50)
LDL Cholesterol: 121 mg/dL — ABNORMAL HIGH (ref <100)
Total CHOL/HDL Ratio: 3.2 Ratio (ref <5.0)
Triglycerides: 62 mg/dL (ref <150)
VLDL: 12 mg/dL (ref <30)

## 2017-06-19 LAB — HEMOGLOBIN A1C
Hgb A1c MFr Bld: 5 % (ref <5.7)
Mean Plasma Glucose: 97 mg/dL

## 2017-06-19 LAB — HEPATITIS C ANTIBODY: HCV Ab: NEGATIVE

## 2017-06-23 ENCOUNTER — Encounter: Payer: Self-pay | Admitting: *Deleted

## 2017-06-23 LAB — PAP IG (IMAGE GUIDED)

## 2017-07-02 ENCOUNTER — Telehealth: Payer: Self-pay | Admitting: *Deleted

## 2017-07-02 MED ORDER — METRONIDAZOLE 0.75 % EX CREA: 1.0000 "application " | TOPICAL_CREAM | Freq: Every day | CUTANEOUS | 1 refills | Status: DC

## 2017-07-02 NOTE — Telephone Encounter (Signed)
Spoke with patient and she is ok with the change. rx sent to pharmacy by e-script

## 2017-07-02 NOTE — Telephone Encounter (Signed)
calling pt to advise her insurance does not cover  NORITATE 1% cream, they are suggesting and alternative METRONIDAZOLE 0.75% cream.  .left message to have patient return my call.

## 2017-07-15 ENCOUNTER — Other Ambulatory Visit: Payer: Self-pay | Admitting: Internal Medicine

## 2017-07-15 MED ORDER — CLONAZEPAM 1 MG PO TABS: ORAL_TABLET | ORAL | 0 refills | Status: DC

## 2017-07-15 MED ORDER — ZOLPIDEM TARTRATE ER 12.5 MG PO TBCR
12.5000 mg | EXTENDED_RELEASE_TABLET | Freq: Every evening | ORAL | 0 refills | Status: DC | PRN
Start: 2017-07-15 — End: 2017-08-14

## 2017-07-15 NOTE — Addendum Note (Signed)
Addended by: Denyse Amass on: 07/15/2017 02:59 PM   Modules accepted: Orders

## 2017-08-11 ENCOUNTER — Other Ambulatory Visit: Payer: Self-pay | Admitting: Internal Medicine

## 2017-08-14 ENCOUNTER — Other Ambulatory Visit: Payer: Self-pay | Admitting: Internal Medicine

## 2017-08-17 ENCOUNTER — Encounter: Payer: Self-pay | Admitting: Internal Medicine

## 2017-08-17 DIAGNOSIS — Z803 Family history of malignant neoplasm of breast: Secondary | ICD-10-CM | POA: Diagnosis not present

## 2017-08-17 DIAGNOSIS — Z1231 Encounter for screening mammogram for malignant neoplasm of breast: Secondary | ICD-10-CM | POA: Diagnosis not present

## 2017-08-19 ENCOUNTER — Telehealth: Payer: Self-pay | Admitting: *Deleted

## 2017-08-19 DIAGNOSIS — Z1239 Encounter for other screening for malignant neoplasm of breast: Secondary | ICD-10-CM

## 2017-08-19 DIAGNOSIS — R928 Other abnormal and inconclusive findings on diagnostic imaging of breast: Secondary | ICD-10-CM

## 2017-08-19 NOTE — Telephone Encounter (Signed)
Patient called and spoke with Caren Griffins and stated that she needed another mammogram scheduled due to abnormal mammogram. Has an appointment scheduled at [facility] on 08/21/2017 at 2:30. Had first Mammogram on Tuesday 8/21.Order placed. Patient notified.

## 2017-08-20 ENCOUNTER — Encounter: Payer: Self-pay | Admitting: Internal Medicine

## 2017-08-20 DIAGNOSIS — R922 Inconclusive mammogram: Secondary | ICD-10-CM | POA: Diagnosis not present

## 2017-08-20 DIAGNOSIS — Z803 Family history of malignant neoplasm of breast: Secondary | ICD-10-CM | POA: Diagnosis not present

## 2017-08-20 DIAGNOSIS — Z1231 Encounter for screening mammogram for malignant neoplasm of breast: Secondary | ICD-10-CM | POA: Diagnosis not present

## 2017-08-20 LAB — HM MAMMOGRAPHY

## 2017-08-24 ENCOUNTER — Encounter: Payer: Self-pay | Admitting: Internal Medicine

## 2017-09-14 ENCOUNTER — Other Ambulatory Visit: Payer: Self-pay | Admitting: Internal Medicine

## 2017-09-15 ENCOUNTER — Ambulatory Visit (INDEPENDENT_AMBULATORY_CARE_PROVIDER_SITE_OTHER): Payer: Medicare Other

## 2017-09-15 DIAGNOSIS — Z23 Encounter for immunization: Secondary | ICD-10-CM | POA: Diagnosis not present

## 2017-10-14 ENCOUNTER — Other Ambulatory Visit: Payer: Self-pay | Admitting: Internal Medicine

## 2017-10-15 NOTE — Telephone Encounter (Signed)
Medication was checked in [place]. Last refill was 09/14/17. Rx was called in to pharmacy for #120 with no refills.

## 2017-10-30 ENCOUNTER — Other Ambulatory Visit: Payer: Self-pay | Admitting: *Deleted

## 2017-10-30 MED ORDER — ZOLPIDEM TARTRATE ER 12.5 MG PO TBCR: 12.5000 mg | EXTENDED_RELEASE_TABLET | Freq: Every evening | ORAL | 0 refills | Status: DC | PRN

## 2017-10-30 MED ORDER — CLONAZEPAM 1 MG PO TABS: 1.0000 mg | ORAL_TABLET | Freq: Four times a day (QID) | ORAL | 0 refills | Status: DC

## 2017-10-30 NOTE — Telephone Encounter (Signed)
Ok to fill for her to go to [place].

## 2017-10-30 NOTE — Telephone Encounter (Signed)
rx called into pharmacy Spoke with patient and advised results   

## 2017-10-30 NOTE — Telephone Encounter (Signed)
Filled on 10/15/2017, per pt she is going out of the country 11/04/17 for a month and wanted to get his filled early. [facility] stated [facility] would not pay for this and if Dr. Mariea Clonts agreed pt could get a early fill, cash pay. Please advise if early refill ok

## 2017-10-31 ENCOUNTER — Other Ambulatory Visit: Payer: Self-pay | Admitting: Internal Medicine

## 2017-12-14 ENCOUNTER — Other Ambulatory Visit: Payer: Self-pay | Admitting: Internal Medicine

## 2017-12-15 NOTE — Telephone Encounter (Signed)
[place] Database verified and compliance confirmed   

## 2018-01-13 ENCOUNTER — Other Ambulatory Visit: Payer: Self-pay | Admitting: Internal Medicine

## 2018-01-17 ENCOUNTER — Other Ambulatory Visit: Payer: Self-pay | Admitting: Internal Medicine

## 2018-01-18 NOTE — Telephone Encounter (Signed)
Ok to fill? Last filled 12/15/2017 Database checked and verified

## 2018-01-29 ENCOUNTER — Telehealth: Payer: Self-pay | Admitting: *Deleted

## 2018-01-29 NOTE — Telephone Encounter (Signed)
Received fax from [place] stating Clonazepam 1mg  was APPROVED till 12/29/2018.

## 2018-02-14 ENCOUNTER — Other Ambulatory Visit: Payer: Self-pay | Admitting: Internal Medicine

## 2018-02-16 ENCOUNTER — Other Ambulatory Visit: Payer: Self-pay | Admitting: Internal Medicine

## 2018-02-18 ENCOUNTER — Other Ambulatory Visit: Payer: Self-pay | Admitting: *Deleted

## 2018-02-18 MED ORDER — CLONAZEPAM 1 MG PO TABS: ORAL_TABLET | ORAL | 0 refills | Status: DC

## 2018-02-18 NOTE — Telephone Encounter (Signed)
Patient requested. Star checked [place]. Phoned to pharmacy.

## 2018-03-05 ENCOUNTER — Telehealth: Payer: Self-pay

## 2018-03-05 NOTE — Telephone Encounter (Signed)
Patient walked into the office with a letter and plane tickets (for proof) for Dr. Mariea Clonts. The letter stated that patient would be in [place] for one week, 03/16/17 to 03/24/17. Patient would like to know if she can get her clonazepam 1 mg and Armour Thyroid 60 mg early before she leaves.   Please advise if refills can be given early.

## 2018-03-08 MED ORDER — CLONAZEPAM 1 MG PO TABS: ORAL_TABLET | ORAL | 0 refills | Status: DC

## 2018-03-08 MED ORDER — THYROID 60 MG PO TABS: ORAL_TABLET | ORAL | 5 refills | Status: DC

## 2018-03-08 NOTE — Telephone Encounter (Signed)
Ok to fill early for her trip.

## 2018-03-08 NOTE — Telephone Encounter (Signed)
Rx were called in to pharmacy and patient was notified.

## 2018-04-14 ENCOUNTER — Other Ambulatory Visit: Payer: Self-pay | Admitting: Internal Medicine

## 2018-04-14 NOTE — Telephone Encounter (Signed)
A medication refill was received from pharmacy for clonazepam 1mg. Rx was called in to pharmacy after verifying last fill date, provider, and quantity on [place] database.  

## 2018-05-13 ENCOUNTER — Other Ambulatory Visit: Payer: Self-pay | Admitting: Internal Medicine

## 2018-05-17 ENCOUNTER — Encounter: Payer: Self-pay | Admitting: Internal Medicine

## 2018-05-17 ENCOUNTER — Ambulatory Visit (INDEPENDENT_AMBULATORY_CARE_PROVIDER_SITE_OTHER): Payer: Medicare Other | Admitting: Internal Medicine

## 2018-05-17 VITALS — BP 128/80 | HR 86 | Temp 98.0°F | Ht 66.0 in | Wt 122.0 lb

## 2018-05-17 DIAGNOSIS — N8110 Cystocele, unspecified: Secondary | ICD-10-CM | POA: Diagnosis not present

## 2018-05-17 DIAGNOSIS — N8185 Cervical stump prolapse: Secondary | ICD-10-CM | POA: Diagnosis not present

## 2018-05-17 DIAGNOSIS — R350 Frequency of micturition: Secondary | ICD-10-CM

## 2018-05-17 DIAGNOSIS — R739 Hyperglycemia, unspecified: Secondary | ICD-10-CM | POA: Diagnosis not present

## 2018-05-17 NOTE — Progress Notes (Signed)
Location:  [facility] clinic Provider: Jad Johansson L. Mariea Clonts, D.O., C.M.D.  Code Status: full code Goals of Care:  Advanced Directives 05/17/2018  Does Patient Have a Medical Advance Directive? Yes  Type of Advance Directive Living will;Healthcare Power of Attorney  Does patient want to make changes to medical advance directive? No - Patient declined  Copy of [place] in Chart? Yes     Chief Complaint  Patient presents with  . Acute Visit    need referral to OB/GYN, urinary frequency, discuss glucose    HPI: Patient is a 67 y.o. female seen today for an acute visit for urinary frequency and glucose concerns.  She has not seen gyn for the past few years and I've done her pap so she's got to reestablish with gyn.    They went to [place] 2 mos ago and when they were there, she got a UTI and she went to a tourist drop-in clinic, they told her to get her sugar checked when she returned.  She was given abx, but does not recall the name.  It worked.  There was glucose in her urine.  The last few times she's had intercourse, her vagina is prolapsing.  She does all of her exercise, yoga, etc.  It comes out like a toad swells up its throat.  It's been a week since the last time, but it's still protruding.  It makes sex uncomfortable.  During it, it's fine and they use lubrication, but the week after, she pays for it and suffers.  It's painful and feels like cystitis afterwards from irritation of the urethra.  Says this has been going on the better part of this year.  She also continues to have urinary frequency every 2 hours through day and night.    Glucose on last labs here was normal at 85 in June of 2018.  Past Medical History:  Diagnosis Date  . Anxiety state, unspecified   . Barrett's esophagus   . Cystocele, midline   . Depressive disorder, not elsewhere classified   . Encounter for long-term (current) use of other medications   . Glycosuria   . Insomnia, unspecified   .  Obstructive sleep apnea (adult) (pediatric)   . Oral aphthae   . Other and unspecified hyperlipidemia   . Proteinuria   . Reflux esophagitis   . Rosacea   . Senile osteoporosis   . STD (sexually transmitted disease)    HSV2  . Unspecified essential hypertension   . Unspecified hypothyroidism   . Viral hepatitis A without mention of hepatic coma     Past Surgical History:  Procedure Laterality Date  . BREAST ENHANCEMENT SURGERY  2010  . COSMETIC SURGERY  2009   for protruding ears  . GANGLION CYST EXCISION  2011   right palm  . tooth implant  [id number]  . tummy tuck      Allergies  Allergen Reactions  . Adhesive [Tape]   . Lactose Intolerance (Gi)   . Sulfa Antibiotics     Outpatient Encounter Medications as of 05/17/2018  Medication Sig  . calcium citrate-vitamin D (CITRACAL+D) 315-200 MG-UNIT tablet Take 1 tablet by mouth 2 (two) times daily.  . cholecalciferol (VITAMIN D) 1000 UNITS tablet Take 1,000 Units by mouth daily.  . clonazePAM (KLONOPIN) 1 MG tablet TAKE 1 TABLET BY MOUTH 3 TO 4 TIMES DAILY AS NEEDED  . cyanocobalamin 1000 MCG tablet Take 1,000 mcg by mouth daily.  . metroNIDAZOLE (METROCREAM) 0.75 % cream  Apply 1 application topically at bedtime.  Marland Kitchen omeprazole (PRILOSEC OTC) 20 MG tablet Take 20 mg by mouth daily.  . Probiotic Product (PROBIOTIC DAILY PO) Take by mouth daily.  Marland Kitchen thyroid (ARMOUR THYROID) 60 MG tablet TAKE ONE TABLET BY MOUTH ONCE DAILY FOR THYROID  . [DISCONTINUED] aspirin 500 MG EC tablet Take 500 mg by mouth every 6 (six) hours as needed for pain.  . [DISCONTINUED] Krill Oil 500 MG CAPS Take by mouth daily.  . [DISCONTINUED] zolpidem (AMBIEN CR) 12.5 MG CR tablet Take 1 tablet (12.5 mg total) by mouth at bedtime as needed.   No facility-administered encounter medications on file as of 05/17/2018.     Review of Systems:  Review of Systems  Constitutional: Negative for chills and fever.  HENT: Negative for hearing loss.   Eyes: Negative  for blurred vision.       Glasses  Respiratory: Negative for shortness of breath.   Cardiovascular: Negative for chest pain and palpitations.  Gastrointestinal: Negative for abdominal pain, blood in stool, constipation, diarrhea and melena.  Genitourinary: Positive for dysuria and frequency. Negative for urgency.       Prolapse from vagina  Musculoskeletal: Negative for falls.  Neurological: Negative for dizziness and loss of consciousness.  Psychiatric/Behavioral: Negative for memory loss. The patient is nervous/anxious.     Health Maintenance  Topic Date Due  . PNA vac Low Risk Adult (2 of 2 - PPSV23) 06/18/2018  . INFLUENZA VACCINE  07/29/2018  . MAMMOGRAM  08/21/2019  . TETANUS/TDAP  11/24/2021  . COLONOSCOPY  08/24/2024  . DEXA SCAN  Completed  . Hepatitis C Screening  Completed    Physical Exam: Vitals:   05/17/18 0905  BP: 128/80  Pulse: 86  Temp: 98 F (36.7 C)  TempSrc: Oral  SpO2: 98%  Weight: 122 lb (55.3 kg)  Height: 5\' 6"  (1.676 m)   Body mass index is 19.69 kg/m. Physical Exam  Constitutional: She is oriented to person, place, and time. She appears well-developed and well-nourished. No distress.  HENT:  Head: Normocephalic and atraumatic.  Eyes:  glasses  Cardiovascular: Normal rate, regular rhythm, normal heart sounds and intact distal pulses.  Pulmonary/Chest: Effort normal and breath sounds normal. No respiratory distress.  Abdominal: Soft. Bowel sounds are normal. She exhibits no distension. There is no tenderness.  Genitourinary:  Genitourinary Comments: Defer to gyn (pt very anxious and will need exam at gyn either way)  Musculoskeletal: Normal range of motion.  Neurological: She is alert and oriented to person, place, and time.  Skin: Skin is warm and dry.  Psychiatric:  Anxious and jittery today    Labs reviewed: Basic Metabolic Panel: Recent Labs    06/18/17 1500  NA 139  K 4.9  CL 103  CO2 29  GLUCOSE 85  BUN 16  CREATININE  0.69  CALCIUM 9.9  TSH 0.79   Liver Function Tests: Recent Labs    06/18/17 1500  AST 14  ALT 10  ALKPHOS 42  BILITOT 0.5  PROT 6.9  ALBUMIN 4.4   No results for input(s): LIPASE, AMYLASE in the last 8760 hours. No results for input(s): AMMONIA in the last 8760 hours. CBC: Recent Labs    06/18/17 1500  WBC 3.0*  NEUTROABS 1,770  HGB 14.6  HCT 43.3  MCV 97.5  PLT 264   Lipid Panel: Recent Labs    06/18/17 1500  CHOL 193  HDL 60  LDLCALC 121*  TRIG 62  CHOLHDL 3.2  Lab Results  Component Value Date   HGBA1C 5.0 06/18/2017   Assessment/Plan 1. Urinary frequency -r/o another infection, but suspect symptoms are due to prolapse - Urinalysis, Routine w reflex microscopic - Urine Culture - Ambulatory referral to Gynecology  2. Cervical stump prolapse -based on history, refer to gyn for evaluation and mgt - Urinalysis, Routine w reflex microscopic - Urine Culture - Ambulatory referral to Gynecology  3. Hyperglycemia - had glucosuria on urine sample so will recheck urine and also check fasting glucose and hba1c today - Basic metabolic panel - Hemoglobin A1c  Labs/tests ordered:   Orders Placed This Encounter  Procedures  . Urine Culture  . Basic metabolic panel    Order Specific Question:   Has the patient fasted?    Answer:   Yes  . Urinalysis, Routine w reflex microscopic  . Hemoglobin A1c  . Ambulatory referral to Gynecology    Referral Priority:   Routine    Referral Type:   Consultation    Referral Reason:   Specialty Services Required    Requested Specialty:   Gynecology    Number of Visits Requested:   1   Next appt:  07/05/2018--keep as scheduled with labs before and AWV with Tobey Bride L. Ryker [place]bury, D.O. [facility] [address], [place] [address] Cell Phone (Mon-Fri 8am-5pm):  [phone] On Call:  [phone] & follow prompts after 5pm & weekends Office Phone:  [phone] Office  Fax:  [phone]

## 2018-05-17 NOTE — Patient Instructions (Signed)
Pelvic Organ Prolapse Pelvic organ prolapse is the stretching, bulging, or dropping of pelvic organs into an abnormal position. It happens when the muscles and tissues that surround and support pelvic structures are stretched or weak. Pelvic organ prolapse can involve:  Vagina (vaginal prolapse).  Uterus (uterine prolapse).  Bladder (cystocele).  Rectum (rectocele).  Intestines (enterocele).  When organs other than the vagina are involved, they often bulge into the vagina or protrude from the vagina, depending on how severe the prolapse is. What are the causes? Causes of this condition include:  Pregnancy, labor, and childbirth.  Long-lasting (chronic) cough.  Chronic constipation.  Obesity.  Past pelvic surgery.  Aging. During and after menopause, a decreased production of the hormone estrogen can weaken pelvic ligaments and muscles.  Consistently lifting more than 50 lb (23 kg).  Buildup of fluid in the abdomen due to certain diseases and other conditions.  What are the signs or symptoms? Symptoms of this condition include:  Loss of bladder control when you cough, sneeze, strain, and exercise (stress incontinence). This may be worse immediately following childbirth, and it may gradually improve over time.  Feeling pressure in your pelvis or vagina. This pressure may increase when you cough or when you are having a bowel movement.  A bulge that protrudes from the opening of your vagina or against your vaginal wall. If your uterus protrudes through the opening of your vagina and rubs against your clothing, you may also experience soreness, ulcers, infection, pain, and bleeding.  Increased effort to have a bowel movement or urinate.  Pain in your low back.  Pain, discomfort, or disinterest in sexual intercourse.  Repeated bladder infections (urinary tract infections).  Difficulty inserting or inability to insert a tampon or applicator.  In some people, this  condition does not cause any symptoms. How is this diagnosed? Your health care provider may perform an internal and external vaginal and rectal exam. During the exam, you may be asked to cough and strain while you are lying down, sitting, and standing up. Your health care provider will determine if other tests are required, such as bladder function tests. How is this treated? In most cases, this condition needs to be treated only if it produces symptoms. No treatment is guaranteed to correct the prolapse or relieve the symptoms completely. Treatment may include:  Lifestyle changes, such as: ? Avoiding drinking beverages that contain caffeine. ? Increasing your intake of high-fiber foods. This can help to decrease constipation and straining during bowel movements. ? Emptying your bladder at scheduled times (bladder training therapy). This can help to reduce or avoid urinary incontinence. ? Losing weight if you are overweight or obese.  Estrogen. Estrogen may help mild prolapse by increasing the strength and tone of pelvic floor muscles.  Kegel exercises. These may help mild cases of prolapse by strengthening and tightening the muscles of the pelvic floor.  Pessary insertion. A pessary is a soft, flexible device that is placed into your vagina by your health care provider to help support the vaginal walls and keep pelvic organs in place.  Surgery. This is often the only form of treatment for severe prolapse. Different types of surgeries are available.  Follow these instructions at home:  Wear a sanitary pad or absorbent product if you have urinary incontinence.  Avoid heavy lifting and straining with exercise and work. Do not hold your breath when you perform mild to moderate lifting and exercise activities. Limit your activities as directed by your health care   provider.  Take medicines only as directed by your health care provider.  Perform Kegel exercises as directed by your health care  provider.  If you have a pessary, take care of it as directed by your health care provider. Contact a health care provider if:  Your symptoms interfere with your daily activities or sex life.  You need medicine to help with the discomfort.  You notice bleeding from the vagina that is not related to your period.  You have a fever.  You have pain or bleeding when you urinate.  You have bleeding when you have a bowel movement.  You lose urine when you have sex.  You have chronic constipation.  You have a pessary that falls out.  You have vaginal discharge that has a bad smell.  You have low abdominal pain or cramping that is unusual for you. This information is not intended to replace advice given to you by your health care provider. Make sure you discuss any questions you have with your health care provider. Document Released: 07/12/2014 Document Revised: 05/22/2016 Document Reviewed: 02/27/2014 Elsevier Interactive Patient Education  2018 [employer]  

## 2018-05-18 LAB — HEMOGLOBIN A1C
Hgb A1c MFr Bld: 4.8 % of total Hgb (ref <5.7)
Mean Plasma Glucose: 91 (calc)
eAG (mmol/L): 5 (calc)

## 2018-05-18 LAB — URINALYSIS, ROUTINE W REFLEX MICROSCOPIC
Bilirubin Urine: NEGATIVE
Glucose, UA: NEGATIVE
Hgb urine dipstick: NEGATIVE
Leukocytes, UA: NEGATIVE
Nitrite: NEGATIVE
Protein, ur: NEGATIVE
Specific Gravity, Urine: 1.016 (ref 1.001–1.03)
pH: 6.5 (ref 5.0–8.0)

## 2018-05-18 LAB — URINE CULTURE
MICRO NUMBER:: 90610282
Result:: NO GROWTH
SPECIMEN QUALITY:: ADEQUATE

## 2018-05-18 LAB — BASIC METABOLIC PANEL
BUN: 15 mg/dL (ref 7–25)
CO2: 30 mmol/L (ref 20–32)
Calcium: 10.7 mg/dL — ABNORMAL HIGH (ref 8.6–10.4)
Chloride: 103 mmol/L (ref 98–110)
Creat: 0.75 mg/dL (ref 0.50–0.99)
Glucose, Bld: 94 mg/dL (ref 65–99)
Potassium: 4.3 mmol/L (ref 3.5–5.3)
Sodium: 141 mmol/L (ref 135–146)

## 2018-06-15 ENCOUNTER — Other Ambulatory Visit: Payer: Self-pay | Admitting: Internal Medicine

## 2018-06-15 NOTE — Telephone Encounter (Signed)
A medication refill was received from pharmacy for clonazepam 1mg. Rx was called in to pharmacy after verifying last fill date, provider, and quantity on [place] database.  

## 2018-06-30 ENCOUNTER — Other Ambulatory Visit: Payer: Self-pay

## 2018-06-30 ENCOUNTER — Ambulatory Visit (INDEPENDENT_AMBULATORY_CARE_PROVIDER_SITE_OTHER): Payer: Medicare Other | Admitting: Certified Nurse Midwife

## 2018-06-30 ENCOUNTER — Encounter

## 2018-06-30 ENCOUNTER — Encounter: Payer: Self-pay | Admitting: Certified Nurse Midwife

## 2018-06-30 VITALS — BP 120/62 | HR 70 | Resp 16 | Ht 65.25 in | Wt 123.0 lb

## 2018-06-30 DIAGNOSIS — N952 Postmenopausal atrophic vaginitis: Secondary | ICD-10-CM | POA: Diagnosis not present

## 2018-06-30 DIAGNOSIS — Z01419 Encounter for gynecological examination (general) (routine) without abnormal findings: Secondary | ICD-10-CM | POA: Diagnosis not present

## 2018-06-30 DIAGNOSIS — N816 Rectocele: Secondary | ICD-10-CM

## 2018-06-30 NOTE — Patient Instructions (Signed)

## 2018-06-30 NOTE — Progress Notes (Signed)
  Subjective:     Patient ID: Donna Ware, female   DOB: 08/29/1951, 67 y.o.   MRN: [id number]  Patient ( previous patient here) here to re-establish care for problem. She is having her pelvic exam and pap smear with PCP Hollace Kinnier. Pap smear has been normal. She is here  with complaint of a bulge noted in vagina after sex each time and has noted some pain at time of occurrence. Uses Estrace cream twice weekly for vaginal dryness. Has some external swelling after sexual activity. Doing kegel exercises. Some change in bowel and bladder habits with ? prolapse. Sometimes has to press in vagina to push bowel movement out if she is constipated.Marland Kitchen Here to have evaluation of vagina. Denies vaginal dryness or pain today. Had bowel movement without problem yesterday. No bleeding noted with stool or pain.  "Just want to make sure everything is normal "down there".    Review of Systems  Constitutional: Positive for appetite change. Negative for activity change.  Gastrointestinal: Positive for constipation. Negative for abdominal pain and blood in stool.  Genitourinary: Positive for difficulty urinating, dyspareunia and vaginal pain. Negative for frequency, genital sores, pelvic pain, vaginal bleeding and vaginal discharge.       Sometimes to start stream of urine has to run water otherwise no issues Pain with sexual activity if she does not use lubrication, which works well.  Skin: Negative.   Psychiatric/Behavioral: The patient is not nervous/anxious.        Objective:   Physical Exam  Constitutional: She is oriented to person, place, and time. She appears well-developed and well-nourished.  Abdominal: Soft. Normal appearance. She exhibits no distension and no mass. There is no tenderness. There is no guarding.  Genitourinary: Vagina normal and uterus normal. Pelvic exam was performed with patient supine. There is no rash, tenderness, lesion or injury on the right labia. There is no rash,  tenderness, lesion or injury on the left labia. No vaginal discharge found.  Genitourinary Comments: Normal appearance, no obvious prolapse, very mild  Rectocele. Vagina moist with dryness only noted at introitus, no skin tears or tenderness with exam  Lymphadenopathy: No inguinal adenopathy noted on the right or left side.  Neurological: She is alert and oriented to person, place, and time.  Skin: Skin is warm and dry.  Psychiatric: She has a normal mood and affect. Her behavior is normal. Judgment and thought content normal.       Assessment:     Normal pelvic exam Mild atrophic vaginitis using estrogen cream No prolapse noted Very mild rectocele    Plan:     Reviewed finding with patient regarding normal pelvic exam Discussed continuing to use estrogen cream which helps with tissue support/health for atrophy. Discussed no prolapse noted, very mild rectocele only. Discussed maintaining good bowel habits which should not be a problem at this time. Questions addressed at length. Patient relieved and shown vagina during exam and did not see any changes that she thought she had seen before. Aware to advise if any vaginal bleeding.  Rv prn  15 minutes spent in discussion regarding vaginal concerns in addition to exam.

## 2018-07-05 ENCOUNTER — Ambulatory Visit (INDEPENDENT_AMBULATORY_CARE_PROVIDER_SITE_OTHER): Payer: Medicare Other | Admitting: Internal Medicine

## 2018-07-05 ENCOUNTER — Encounter: Payer: Self-pay | Admitting: Internal Medicine

## 2018-07-05 ENCOUNTER — Other Ambulatory Visit: Payer: Medicare Other

## 2018-07-05 ENCOUNTER — Ambulatory Visit (INDEPENDENT_AMBULATORY_CARE_PROVIDER_SITE_OTHER): Payer: Medicare Other

## 2018-07-05 VITALS — BP 138/70 | HR 84 | Temp 97.4°F | Ht 66.0 in | Wt 124.0 lb

## 2018-07-05 DIAGNOSIS — E039 Hypothyroidism, unspecified: Secondary | ICD-10-CM | POA: Diagnosis not present

## 2018-07-05 DIAGNOSIS — R739 Hyperglycemia, unspecified: Secondary | ICD-10-CM | POA: Diagnosis not present

## 2018-07-05 DIAGNOSIS — I10 Essential (primary) hypertension: Secondary | ICD-10-CM

## 2018-07-05 DIAGNOSIS — Z23 Encounter for immunization: Secondary | ICD-10-CM

## 2018-07-05 DIAGNOSIS — N816 Rectocele: Secondary | ICD-10-CM | POA: Diagnosis not present

## 2018-07-05 DIAGNOSIS — Z0000 Encounter for general adult medical examination without abnormal findings: Secondary | ICD-10-CM | POA: Diagnosis not present

## 2018-07-05 DIAGNOSIS — E785 Hyperlipidemia, unspecified: Secondary | ICD-10-CM | POA: Diagnosis not present

## 2018-07-05 DIAGNOSIS — M810 Age-related osteoporosis without current pathological fracture: Secondary | ICD-10-CM | POA: Diagnosis not present

## 2018-07-05 LAB — LIPID PANEL
Cholesterol: 225 mg/dL — ABNORMAL HIGH (ref <200)
HDL: 66 mg/dL (ref >50)
LDL Cholesterol (Calc): 134 mg/dL (calc) — ABNORMAL HIGH
Non-HDL Cholesterol (Calc): 159 mg/dL (calc) — ABNORMAL HIGH (ref <130)
Total CHOL/HDL Ratio: 3.4 (calc) (ref <5.0)
Triglycerides: 135 mg/dL (ref <150)

## 2018-07-05 NOTE — Progress Notes (Signed)
Subjective:   Donna Ware is a 67 y.o. female who presents for Medicare Annual (Subsequent) preventive examination.  Last AWV-06/18/2017    Objective:     Vitals: BP 138/70 (BP Location: Left Arm, Patient Position: Sitting)   Pulse 84   Temp (!) 97.4 F (36.3 C) (Oral)   Ht 5\' 6"  (1.676 m)   Wt 124 lb (56.2 kg)   LMP 12/30/1999   SpO2 98%   BMI 20.01 kg/m   Body mass index is 20.01 kg/m.  Advanced Directives 07/05/2018 05/17/2018 06/18/2017 08/28/2016 06/12/2016 06/16/2015 06/11/2015  Does Patient Have a Medical Advance Directive? Yes Yes Yes Yes Yes - Yes  Type of Advance Directive Living will;Healthcare Power of Attorney Living will;Healthcare Power of [place];Living will Living will [place] will  Does patient want to make changes to medical advance directive? No - Patient declined No - Patient declined - - - No - Patient declined No - Patient declined  Copy of [place] in Chart? Yes Yes Yes Yes Yes No - copy requested -    Tobacco Social History   Tobacco Use  Smoking Status Never Smoker  Smokeless Tobacco Never Used     Counseling given: Not Answered   Clinical Intake:  Pre-visit preparation completed: No  Pain : No/denies pain     Nutritional Risks: None Diabetes: No  How often do you need to have someone help you when you read instructions, pamphlets, or other written materials from your doctor or pharmacy?: 1 - Never What is the last grade level you completed in school?: College  Interpreter Needed?: No  Information entered by :: [name], RN  Past Medical History:  Diagnosis Date  . Anxiety state, unspecified   . Barrett's esophagus   . Cystocele, midline   . Depressive disorder, not elsewhere classified   . Encounter for long-term (current) use of other medications   . Glycosuria   . Insomnia, unspecified   . Obstructive sleep apnea (adult) (pediatric)   .  Oral aphthae   . Other and unspecified hyperlipidemia   . Proteinuria   . Reflux esophagitis   . Rosacea   . Senile osteoporosis   . STD (sexually transmitted disease)    HSV2  . Unspecified essential hypertension   . Unspecified hypothyroidism   . Viral hepatitis A without mention of hepatic coma    Past Surgical History:  Procedure Laterality Date  . BREAST ENHANCEMENT SURGERY  2010  . COSMETIC SURGERY  2009   for protruding ears  . GANGLION CYST EXCISION  2011   right palm  . teeth implants    . tooth implant  [id number]  . tummy tuck     Family History  Problem Relation Age of Onset  . Heart attack Father   . Heart attack Paternal Grandfather   . Heart disease Brother   . Breast cancer Mother    Social History   Socioeconomic History  . Marital status: Legally Separated    Spouse name: Not on file  . Number of children: Not on file  . Years of education: Not on file  . Highest education level: Not on file  Occupational History  . Not on file  Social Needs  . Financial resource strain: Not hard at all  . Food insecurity:    Worry: Never true    Inability: Never true  . Transportation needs:    Medical: No  Non-medical: No  Tobacco Use  . Smoking status: Never Smoker  . Smokeless tobacco: Never Used  Substance and Sexual Activity  . Alcohol use: Yes    Comment: couple drinks a month  . Drug use: No  . Sexual activity: Yes    Partners: Male    Birth control/protection: Post-menopausal  Lifestyle  . Physical activity:    Days per week: 4 days    Minutes per session: 60 min  . Stress: Only a little  Relationships  . Social connections:    Talks on phone: Twice a week    Gets together: Once a week    Attends religious service: Never    Active member of club or organization: No    Attends meetings of clubs or organizations: Never    Relationship status: Separated  Other Topics Concern  . Not on file  Social History Narrative  . Not on file     Outpatient Encounter Medications as of 07/05/2018  Medication Sig  . B Complex Vitamins (B COMPLEX PO) Take by mouth. With vitamin c  . Cholecalciferol (VITAMIN D PO) Take 2,000 Int'l Units by mouth.  . clonazePAM (KLONOPIN) 1 MG tablet TAKE 1 TABLET BY MOUTH 3-4 TIMES A DAY AS NEEDED  . diphenhydrAMINE HCl, Sleep, (SOMINEX PO) Take by mouth. Takes 1/2 before bed  . estradiol (ESTRACE) 0.1 MG/GM vaginal cream Place 1 Applicatorful vaginally at bedtime.  . Glucosamine-MSM-Hyaluronic Acd (JOINT HEALTH PO) Take by mouth.  . MELATONIN PO Take by mouth.  . metroNIDAZOLE (METROCREAM) 0.75 % cream Apply 1 application topically at bedtime.  . Multiple Vitamins-Minerals (MULTIVITAMIN PO) Take by mouth.  Marland Kitchen omeprazole (PRILOSEC OTC) 20 MG tablet Take 20 mg by mouth daily.  . Probiotic Product (PROBIOTIC DAILY PO) Take by mouth daily.  Marland Kitchen thyroid (ARMOUR THYROID) 60 MG tablet TAKE ONE TABLET BY MOUTH ONCE DAILY FOR THYROID  . UNABLE TO FIND Lactose enzyme   No facility-administered encounter medications on file as of 07/05/2018.     Activities of Daily Living In your present state of health, do you have any difficulty performing the following activities: 07/05/2018  Hearing? N  Vision? N  Difficulty concentrating or making decisions? N  Walking or climbing stairs? N  Dressing or bathing? N  Doing errands, shopping? N  Preparing Food and eating ? N  Using the Toilet? N  In the past six months, have you accidently leaked urine? N  Do you have problems with loss of bowel control? N  Managing your Medications? N  Managing your Finances? N  Housekeeping or managing your Housekeeping? N  Some recent data might be hidden    Patient Care Team: Gayland Curry, DO as PCP - General (Geriatric Medicine) [place], Heather, OD [facility])    Assessment:   This is a routine wellness examination for Donna Ware.  Exercise Activities and Dietary recommendations Current Exercise Habits: Home exercise routine,  Type of exercise: yoga;walking, Time (Minutes): 60, Frequency (Times/Week): 4, Weekly Exercise (Minutes/Week): 240, Intensity: Mild, Exercise limited by: None identified  Goals    None      Fall Risk Fall Risk  07/05/2018 05/17/2018 06/18/2017 08/28/2016 06/12/2016  Falls in the past year? No No No No No   Is the patient's home free of loose throw rugs in walkways, pet beds, electrical cords, etc?   yes      Grab bars in the bathroom? no      Handrails on the stairs?   yes  Adequate lighting?   yes   Depression Screen PHQ 2/9 Scores 07/05/2018 05/17/2018 06/18/2017 08/28/2016  PHQ - 2 Score 0 0 0 0     Cognitive Function MMSE - Mini Mental State Exam 07/05/2018 06/18/2017  Orientation to time 5 5  Orientation to Place 5 5  Registration 3 3  Attention/ Calculation 5 5  Recall 3 3  Language- name 2 objects 2 2  Language- repeat 1 1  Language- follow 3 step command 3 3  Language- read & follow direction 1 1  Write a sentence 1 1  Copy design 1 1  Total score 30 30        Immunization History  Administered Date(s) Administered  . Influenza, High Dose Seasonal PF 09/15/2017  . Influenza,inj,Quad PF,6+ Mos 09/14/2015, 08/28/2016  . Influenza-Unspecified 09/21/2012  . Pneumococcal Conjugate-13 06/18/2017  . Pneumococcal Polysaccharide-23 12/29/1994, 07/05/2018  . Tdap 12/29/1994, 11/25/2011  . Zoster 03/15/2008  . Zoster Recombinat (Shingrix) 12/17/2017    Qualifies for Shingles Vaccine? Up to date, will call with dates  Screening Tests Health Maintenance  Topic Date Due  . INFLUENZA VACCINE  07/29/2018  . MAMMOGRAM  08/21/2019  . TETANUS/TDAP  11/24/2021  . COLONOSCOPY  08/24/2024  . DEXA SCAN  Completed  . Hepatitis C Screening  Completed  . PNA vac Low Risk Adult  Completed    Cancer Screenings: Lung: Low Dose CT Chest recommended if Age 6-80 years, 30 pack-year currently smoking OR have quit w/in 15years. Patient does not qualify. Breast:  Up to date on  Mammogram? Yes   Up to date of Bone Density/Dexa? Yes Colorectal: up to date  Additional Screenings:  Hepatitis C Screening: declined Pneumovax given today    Plan:    I have personally reviewed and addressed the Medicare Annual Wellness questionnaire and have noted the following in the patient's chart:  A. Medical and social history B. Use of alcohol, tobacco or illicit drugs  C. Current medications and supplements D. Functional ability and status E.  Nutritional status F.  Physical activity G. Advance directives H. List of other physicians I.  Hospitalizations, surgeries, and ER visits in previous 12 months J.  [place] to include hearing, vision, cognitive, depression L. Referrals and appointments - none  In addition, I have reviewed and discussed with patient certain preventive protocols, quality metrics, and best practice recommendations. A written personalized care plan for preventive services as well as general preventive health recommendations were provided to patient.  See attached scanned questionnaire for additional information.   Signed,   [name], RN Nurse Health Advisor  Patient concerns: None

## 2018-07-05 NOTE — Progress Notes (Signed)
Location:  [facility] clinic Provider:  Miana Politte L. Mariea Clonts, D.O., C.M.D.  Goals of Care:  Advanced Directives 07/05/2018  Does Patient Have a Medical Advance Directive? Yes  Type of Advance Directive Living will;Healthcare Power of Attorney  Does patient want to make changes to medical advance directive? No - Patient declined  Copy of [place] in Chart? Yes   Chief Complaint  Patient presents with  . Medical Management of Chronic Issues    follow-up     HPI: Patient is a 67 y.o. female seen today for medical management of chronic diseases.    Had a rectocele at gyn and kegels were recommended.  No pessary or surgery was needed.    Sister in law had uterine cancer in her late 25s.  Asks about HPV vaccine for older ladies (she still does not meet the criteria).   When she goes to yoga or bend, she hears cracker crumbs in her knees.  Sometimes hurts a little, feels uncomfortable.  Does not lay awake at night with leg pain.    She had her old shingles shot and then did have the series of two at the pharmacy.  We don't have the date for her second shingrix.    She is fasting for labs.  Needs annual cholesterol check. On a plant-based diet for a year.  Past Medical History:  Diagnosis Date  . Anxiety state, unspecified   . Barrett's esophagus   . Cystocele, midline   . Depressive disorder, not elsewhere classified   . Encounter for long-term (current) use of other medications   . Glycosuria   . Insomnia, unspecified   . Obstructive sleep apnea (adult) (pediatric)   . Oral aphthae   . Other and unspecified hyperlipidemia   . Proteinuria   . Reflux esophagitis   . Rosacea   . Senile osteoporosis   . STD (sexually transmitted disease)    HSV2  . Unspecified essential hypertension   . Unspecified hypothyroidism   . Viral hepatitis A without mention of hepatic coma     Past Surgical History:  Procedure Laterality Date  . BREAST ENHANCEMENT SURGERY  2010  .  COSMETIC SURGERY  2009   for protruding ears  . GANGLION CYST EXCISION  2011   right palm  . teeth implants    . tooth implant  [id number]  . tummy tuck      Allergies  Allergen Reactions  . Adhesive [Tape]   . Lactose Intolerance (Gi)   . Sulfa Antibiotics     Outpatient Encounter Medications as of 07/05/2018  Medication Sig  . B Complex Vitamins (B COMPLEX PO) Take by mouth. With vitamin c  . Cholecalciferol (VITAMIN D PO) Take 2,000 Int'l Units by mouth.  . clonazePAM (KLONOPIN) 1 MG tablet TAKE 1 TABLET BY MOUTH 3-4 TIMES A DAY AS NEEDED  . diphenhydrAMINE HCl, Sleep, (SOMINEX PO) Take by mouth. Takes 1/2 before bed  . estradiol (ESTRACE) 0.1 MG/GM vaginal cream Place 1 Applicatorful vaginally at bedtime.  . Glucosamine-MSM-Hyaluronic Acd (JOINT HEALTH PO) Take by mouth.  . MELATONIN PO Take by mouth.  . Multiple Vitamins-Minerals (MULTIVITAMIN PO) Take by mouth.  Marland Kitchen omeprazole (PRILOSEC OTC) 20 MG tablet Take 20 mg by mouth daily.  . Probiotic Product (PROBIOTIC DAILY PO) Take by mouth daily.  Marland Kitchen thyroid (ARMOUR THYROID) 60 MG tablet TAKE ONE TABLET BY MOUTH ONCE DAILY FOR THYROID  . [DISCONTINUED] metroNIDAZOLE (METROCREAM) 0.75 % cream Apply 1 application topically at bedtime.  . [  DISCONTINUED] UNABLE TO FIND Lactose enzyme   No facility-administered encounter medications on file as of 07/05/2018.     Review of Systems:  Review of Systems  Constitutional: Negative for chills, diaphoresis, fever, malaise/fatigue and weight loss.  HENT: Negative for congestion and hearing loss.   Eyes: Negative for blurred vision.  Respiratory: Negative for cough and shortness of breath.   Cardiovascular: Negative for chest pain, palpitations and leg swelling.       Had edema after long travel only  Gastrointestinal: Negative for abdominal pain, blood in stool, constipation, diarrhea and melena.  Genitourinary: Negative for dysuria.  Musculoskeletal: Negative for back pain, falls, joint  pain, myalgias and neck pain.  Skin: Negative for itching and rash.  Neurological: Negative for dizziness and loss of consciousness.  Endo/Heme/Allergies: Does not bruise/bleed easily.  Psychiatric/Behavioral: Negative for memory loss.    Health Maintenance  Topic Date Due  . INFLUENZA VACCINE  07/29/2018  . MAMMOGRAM  08/21/2019  . TETANUS/TDAP  11/24/2021  . COLONOSCOPY  08/24/2024  . DEXA SCAN  Completed  . Hepatitis C Screening  Completed  . PNA vac Low Risk Adult  Completed    Physical Exam: Vitals:   07/05/18 1326  BP: 138/70  Pulse: 84  Temp: (!) 97.4 F (36.3 C)  TempSrc: Oral  SpO2: 98%  Weight: 124 lb (56.2 kg)  Height: 5\' 6"  (1.676 m)   Body mass index is 20.01 kg/m. Physical Exam  Constitutional: She is oriented to person, place, and time. She appears well-developed and well-nourished. No distress.  HENT:  Head: Normocephalic and atraumatic.  Right Ear: External ear normal.  Left Ear: External ear normal.  Nose: Nose normal.  Mouth/Throat: Oropharynx is clear and moist. No oropharyngeal exudate.  Eyes: Pupils are equal, round, and reactive to light. Conjunctivae and EOM are normal.  glasses  Neck: Normal range of motion. Neck supple. No JVD present. No tracheal deviation present. No thyromegaly present.  Cardiovascular: Normal rate, regular rhythm, normal heart sounds and intact distal pulses.  Pulmonary/Chest: Effort normal and breath sounds normal. No stridor. No respiratory distress. She has no wheezes. She has no rales.  Abdominal: Soft. Bowel sounds are normal. She exhibits no distension and no mass. There is no tenderness. There is no rebound and no guarding.  Musculoskeletal: Normal range of motion. She exhibits no tenderness.  Lymphadenopathy:    She has no cervical adenopathy.  Neurological: She is alert and oriented to person, place, and time. No cranial nerve deficit.  Skin: Skin is warm and dry. Capillary refill takes less than 2 seconds.    Psychiatric: She has a normal mood and affect.  anxious    Labs reviewed: Basic Metabolic Panel: Recent Labs    05/17/18 1003  NA 141  K 4.3  CL 103  CO2 30  GLUCOSE 94  BUN 15  CREATININE 0.75  CALCIUM 10.7*   Liver Function Tests: No results for input(s): AST, ALT, ALKPHOS, BILITOT, PROT, ALBUMIN in the last 8760 hours. No results for input(s): LIPASE, AMYLASE in the last 8760 hours. No results for input(s): AMMONIA in the last 8760 hours. CBC: No results for input(s): WBC, NEUTROABS, HGB, HCT, MCV, PLT in the last 8760 hours. Lipid Panel: No results for input(s): CHOL, HDL, LDLCALC, TRIG, CHOLHDL, LDLDIRECT in the last 8760 hours. Lab Results  Component Value Date   HGBA1C 4.8 05/17/2018    Procedures since last visit: Reviewed gyn note  Assessment/Plan 1. Rectocele -per gyn visit, no need for  pessary or surgery, just kegels and maintaining adequate vaginal lubrication  2. Essential hypertension -bp well controlled, cont same exercise, diet - CBC with Differential/Platelet; Future - COMPLETE METABOLIC PANEL WITH GFR; Future  3. Acquired hypothyroidism -last tsh was wnl, check before next visit - TSH; Future  4. Senile osteoporosis -cont vitamin D therapy, weightbearing exercise  5. Hyperlipidemia, unspecified hyperlipidemia type -not on meds, cont healthy diet - Lipid panel; Future  6. Hyperglycemia -cont healthy diet and exercise - Hemoglobin A1c; Future  Labs/tests ordered: Orders Placed This Encounter  Procedures  . CBC with Differential/Platelet    Standing Status:   Future    Standing Expiration Date:   07/05/2020  . COMPLETE METABOLIC PANEL WITH GFR    Standing Status:   Future    Standing Expiration Date:   07/05/2020  . Hemoglobin A1c    Standing Status:   Future    Standing Expiration Date:   07/05/2020  . Lipid panel    Standing Status:   Future    Standing Expiration Date:   07/05/2020  . TSH    Standing Status:   Future    Standing  Expiration Date:   07/05/2020   Next appt:  1 year for EV, labs before  Theresia Pree L. Rondey Fallen, D.O. [facility] [address], [place] [address] Cell Phone (Mon-Fri 8am-5pm):  [phone] On Call:  [phone] & follow prompts after 5pm & weekends Office Phone:  [phone] Office Fax:  [phone]

## 2018-07-05 NOTE — Patient Instructions (Signed)
Donna Ware , Thank you for taking time to come for your Medicare Wellness Visit. I appreciate your ongoing commitment to your health goals. Please review the following plan we discussed and let me know if I can assist you in the future.   Screening recommendations/referrals: Colonoscopy up to date, due 08/24/2024 Mammogram up to date, due 09/09/2019 Bone Density up to date Recommended yearly ophthalmology/optometry visit for glaucoma screening and checkup Recommended yearly dental visit for hygiene and checkup  Vaccinations: Influenza vaccine up to date, due 2019 fall season Pneumococcal vaccine 23 given today. Up to date, completed Tdap vaccine up to date, due 11/24/2021 Shingles vaccine up to date. Please call us with the date of your second shot    Advanced directives: in chart  Conditions/risks identified: none  Next appointment: Donna Dense, RN 07/08/2019 @ 12:45pm   Preventive Care 65 Years and Older, Female Preventive care refers to lifestyle choices and visits with your health care provider that can promote health and wellness. What does preventive care include?  A yearly physical exam. This is also called an annual well check.  Dental exams once or twice a year.  Routine eye exams. Ask your health care provider how often you should have your eyes checked.  Personal lifestyle choices, including:  Daily care of your teeth and gums.  Regular physical activity.  Eating a healthy diet.  Avoiding tobacco and drug use.  Limiting alcohol use.  Practicing safe sex.  Taking low-dose aspirin every day.  Taking vitamin and mineral supplements as recommended by your health care provider. What happens during an annual well check? The services and screenings done by your health care provider during your annual well check will depend on your age, overall health, lifestyle risk factors, and family history of disease. Counseling  Your health care provider may ask you  questions about your:  Alcohol use.  Tobacco use.  Drug use.  Emotional well-being.  Home and relationship well-being.  Sexual activity.  Eating habits.  History of falls.  Memory and ability to understand (cognition).  Work and work Statistician.  Reproductive health. Screening  You may have the following tests or measurements:  Height, weight, and BMI.  Blood pressure.  Lipid and cholesterol levels. These may be checked every 5 years, or more frequently if you are over 65 years old.  Skin check.  Lung cancer screening. You may have this screening every year starting at age 51 if you have a 30-pack-year history of smoking and currently smoke or have quit within the past 15 years.  Fecal occult blood test (FOBT) of the stool. You may have this test every year starting at age 62.  Flexible sigmoidoscopy or colonoscopy. You may have a sigmoidoscopy every 5 years or a colonoscopy every 10 years starting at age 74.  Hepatitis C blood test.  Hepatitis B blood test.  Sexually transmitted disease (STD) testing.  Diabetes screening. This is done by checking your blood sugar (glucose) after you have not eaten for a while (fasting). You may have this done every 1-3 years.  Bone density scan. This is done to screen for osteoporosis. You may have this done starting at age 11.  Mammogram. This may be done every 1-2 years. Talk to your health care provider about how often you should have regular mammograms. Talk with your health care provider about your test results, treatment options, and if necessary, the need for more tests. Vaccines  Your health care provider may recommend certain vaccines, such  as:  Influenza vaccine. This is recommended every year.  Tetanus, diphtheria, and acellular pertussis (Tdap, Td) vaccine. You may need a Td booster every 10 years.  Zoster vaccine. You may need this after age 11.  Pneumococcal 13-valent conjugate (PCV13) vaccine. One dose is  recommended after age 36.  Pneumococcal polysaccharide (PPSV23) vaccine. One dose is recommended after age 64. Talk to your health care provider about which screenings and vaccines you need and how often you need them. This information is not intended to replace advice given to you by your health care provider. Make sure you discuss any questions you have with your health care provider. Document Released: 01/11/2016 Document Revised: 09/03/2016 Document Reviewed: 10/16/2015 Elsevier Interactive Patient Education  2017 [place] Prevention in the Home Falls can cause injuries. They can happen to people of all ages. There are many things you can do to make your home safe and to help prevent falls. What can I do on the outside of my home?  Regularly fix the edges of walkways and driveways and fix any cracks.  Remove anything that might make you trip as you walk through a door, such as a raised step or threshold.  Trim any bushes or trees on the path to your home.  Use bright outdoor lighting.  Clear any walking paths of anything that might make someone trip, such as rocks or tools.  Regularly check to see if handrails are loose or broken. Make sure that both sides of any steps have handrails.  Any raised decks and porches should have guardrails on the edges.  Have any leaves, snow, or ice cleared regularly.  Use sand or salt on walking paths during winter.  Clean up any spills in your garage right away. This includes oil or grease spills. What can I do in the bathroom?  Use night lights.  Install grab bars by the toilet and in the tub and shower. Do not use towel bars as grab bars.  Use non-skid mats or decals in the tub or shower.  If you need to sit down in the shower, use a plastic, non-slip stool.  Keep the floor dry. Clean up any water that spills on the floor as soon as it happens.  Remove soap buildup in the tub or shower regularly.  Attach bath mats  securely with double-sided non-slip rug tape.  Do not have throw rugs and other things on the floor that can make you trip. What can I do in the bedroom?  Use night lights.  Make sure that you have a light by your bed that is easy to reach.  Do not use any sheets or blankets that are too big for your bed. They should not hang down onto the floor.  Have a firm chair that has side arms. You can use this for support while you get dressed.  Do not have throw rugs and other things on the floor that can make you trip. What can I do in the kitchen?  Clean up any spills right away.  Avoid walking on wet floors.  Keep items that you use a lot in easy-to-reach places.  If you need to reach something above you, use a strong step stool that has a grab bar.  Keep electrical cords out of the way.  Do not use floor polish or wax that makes floors slippery. If you must use wax, use non-skid floor wax.  Do not have throw rugs and other things on the  floor that can make you trip. What can I do with my stairs?  Do not leave any items on the stairs.  Make sure that there are handrails on both sides of the stairs and use them. Fix handrails that are broken or loose. Make sure that handrails are as long as the stairways.  Check any carpeting to make sure that it is firmly attached to the stairs. Fix any carpet that is loose or worn.  Avoid having throw rugs at the top or bottom of the stairs. If you do have throw rugs, attach them to the floor with carpet tape.  Make sure that you have a light switch at the top of the stairs and the bottom of the stairs. If you do not have them, ask someone to add them for you. What else can I do to help prevent falls?  Wear shoes that:  Do not have high heels.  Have rubber bottoms.  Are comfortable and fit you well.  Are closed at the toe. Do not wear sandals.  If you use a stepladder:  Make sure that it is fully opened. Do not climb a closed  stepladder.  Make sure that both sides of the stepladder are locked into place.  Ask someone to hold it for you, if possible.  Clearly mark and make sure that you can see:  Any grab bars or handrails.  First and last steps.  Where the edge of each step is.  Use tools that help you move around (mobility aids) if they are needed. These include:  Canes.  Walkers.  Scooters.  Crutches.  Turn on the lights when you go into a dark area. Replace any light bulbs as soon as they burn out.  Set up your furniture so you have a clear path. Avoid moving your furniture around.  If any of your floors are uneven, fix them.  If there are any pets around you, be aware of where they are.  Review your medicines with your doctor. Some medicines can make you feel dizzy. This can increase your chance of falling. Ask your doctor what other things that you can do to help prevent falls. This information is not intended to replace advice given to you by your health care provider. Make sure you discuss any questions you have with your health care provider. Document Released: 10/11/2009 Document Revised: 05/22/2016 Document Reviewed: 01/19/2015 Elsevier Interactive Patient Education  2017 [employer].

## 2018-07-11 ENCOUNTER — Other Ambulatory Visit: Payer: Self-pay | Admitting: Internal Medicine

## 2018-07-12 DIAGNOSIS — L918 Other hypertrophic disorders of the skin: Secondary | ICD-10-CM | POA: Diagnosis not present

## 2018-07-12 DIAGNOSIS — D225 Melanocytic nevi of trunk: Secondary | ICD-10-CM | POA: Diagnosis not present

## 2018-07-12 DIAGNOSIS — L820 Inflamed seborrheic keratosis: Secondary | ICD-10-CM | POA: Diagnosis not present

## 2018-07-12 DIAGNOSIS — L218 Other seborrheic dermatitis: Secondary | ICD-10-CM | POA: Diagnosis not present

## 2018-07-12 NOTE — Telephone Encounter (Signed)
[place] Database verified and compliance confirmed   

## 2018-08-13 ENCOUNTER — Other Ambulatory Visit: Payer: Self-pay | Admitting: Internal Medicine

## 2018-08-23 DIAGNOSIS — Z1231 Encounter for screening mammogram for malignant neoplasm of breast: Secondary | ICD-10-CM | POA: Diagnosis not present

## 2018-08-23 DIAGNOSIS — Z803 Family history of malignant neoplasm of breast: Secondary | ICD-10-CM | POA: Diagnosis not present

## 2018-08-23 DIAGNOSIS — M810 Age-related osteoporosis without current pathological fracture: Secondary | ICD-10-CM | POA: Diagnosis not present

## 2018-08-23 LAB — HM DEXA SCAN

## 2018-08-23 LAB — HM MAMMOGRAPHY

## 2018-08-26 ENCOUNTER — Encounter: Payer: Self-pay | Admitting: *Deleted

## 2018-09-06 ENCOUNTER — Telehealth: Payer: Self-pay | Admitting: *Deleted

## 2018-09-06 NOTE — Telephone Encounter (Signed)
Spoke with patient and she want to know how much everything will cost for prolia before she agrees to it.

## 2018-09-10 ENCOUNTER — Encounter: Payer: Self-pay | Admitting: Internal Medicine

## 2018-09-13 ENCOUNTER — Encounter: Payer: Self-pay | Admitting: Internal Medicine

## 2018-09-14 ENCOUNTER — Telehealth: Payer: Self-pay

## 2018-09-14 NOTE — Telephone Encounter (Signed)
I called [employer] to find out the status of insurance verification for prolia and I was told that patient's secondary insurance terminated on 08/29/18 so they cannot complete verification. I called patient to ask about any new insurance cards that she might have and was told that she has [employer] but she does not have the card yet. I asked that she bring the card to the office so a copy can be made and I can then re-file the prolia verification. Patient then proceeds to tell me that she does not know when she might get the card but she will bring it when she can.    Prolia is now on hold until patient can bring in new secondary insurance card.

## 2018-09-15 ENCOUNTER — Other Ambulatory Visit: Payer: Self-pay | Admitting: Internal Medicine

## 2018-09-15 NOTE — Telephone Encounter (Signed)
[place] Database verified and compliance confirmed   

## 2018-09-20 ENCOUNTER — Ambulatory Visit (INDEPENDENT_AMBULATORY_CARE_PROVIDER_SITE_OTHER): Payer: Medicare Other

## 2018-09-20 DIAGNOSIS — Z23 Encounter for immunization: Secondary | ICD-10-CM | POA: Diagnosis not present

## 2018-09-22 ENCOUNTER — Telehealth: Payer: Self-pay

## 2018-09-22 NOTE — Telephone Encounter (Signed)
Patient called to inform Donna Ware that she has an appointment on 10/14/18 with her secondary insurance carrier (some type of [employer]).   Patient will provide the information from her secondary insurance carrier by phone or stopping by the office, at that time we can proceed with prolia verification.   Patient is waiting to receive prolia to avoid having to pay a large amount out of pocket  FYI

## 2018-10-12 ENCOUNTER — Other Ambulatory Visit: Payer: Self-pay | Admitting: Internal Medicine

## 2018-10-13 ENCOUNTER — Other Ambulatory Visit: Payer: Self-pay | Admitting: Internal Medicine

## 2018-10-15 ENCOUNTER — Other Ambulatory Visit: Payer: Self-pay | Admitting: Internal Medicine

## 2018-10-15 NOTE — Telephone Encounter (Signed)
A medication refill was received from pharmacy for clonazepam 1mg. Rx was called in to pharmacy after verifying last fill date, provider, and quantity on [place] database.  

## 2018-11-04 ENCOUNTER — Telehealth: Payer: Self-pay | Admitting: *Deleted

## 2018-11-04 MED ORDER — CLONAZEPAM 1 MG PO TABS: ORAL_TABLET | ORAL | 0 refills | Status: DC

## 2018-11-04 NOTE — Telephone Encounter (Signed)
Patient dropped in office and stated that she needs an early refill on her Clonazepam 1mg  4 tablets daily. Stated that she is going to [place] and will be there for a month and she does not want to run out of medication. They leave on Wednesday November 13th till December 18th. [facility] [address] [facility]. Please Advise.

## 2018-11-04 NOTE — Telephone Encounter (Signed)
I doubt they will fill it before Monday or Tuesday, but it was sent for her.  I hope she enjoys her trip.

## 2018-12-07 ENCOUNTER — Other Ambulatory Visit: Payer: Self-pay | Admitting: Internal Medicine

## 2018-12-07 NOTE — Telephone Encounter (Signed)
A medication refill was received from pharmacy for clonazepam 1mg. Rx was called in to pharmacy after verifying last fill date, provider, and quantity on [place] database.  

## 2019-01-07 ENCOUNTER — Other Ambulatory Visit: Payer: Self-pay | Admitting: Internal Medicine

## 2019-01-07 NOTE — Telephone Encounter (Signed)
[place] Database verified and compliance confirmed   

## 2019-01-10 ENCOUNTER — Telehealth: Payer: Self-pay | Admitting: *Deleted

## 2019-01-10 NOTE — Telephone Encounter (Signed)
Patient called and left message on Clinical intake stating that her pharmacy has changed to [facility] due to insurance. Stated that she was going to need her Rx's sent there.    Tried calling patient back to verify which Rx she needed sent. LMOM to return call.

## 2019-01-13 NOTE — Telephone Encounter (Signed)
.  left message to have patient return my call.  Pt asking for refills for:  clonazepam 1mg   Last filled 01/07/19 ( no refill needed) [place] thyroid last filled 10/12/18 ( was given 62mth not due until April 2020) Metronidazole 0.75 cream (not on med list) Estrace ( do we fill?)

## 2019-01-17 NOTE — Telephone Encounter (Signed)
Spoke to patient and advised how the refill process works, pt will call when she needs refill. Pt has not had Metronidazole filled since 2015 ( not on med list), and then it was a gel, now it's a cream please advise

## 2019-01-17 NOTE — Telephone Encounter (Signed)
She originally got some of these meds from dermatology--metronidazole cream is ok as she requests.  It's ok to fill them to save her the trouble of calling around to multiple offices.  Also, we should then be better able to keep track of what she's using.

## 2019-01-20 MED ORDER — METRONIDAZOLE 0.75 % EX CREA: 1.0000 "application " | TOPICAL_CREAM | Freq: Every day | CUTANEOUS | 1 refills | Status: DC

## 2019-01-20 NOTE — Telephone Encounter (Signed)
Spoke with patient and advised results rx sent to pharmacy by e[facility]script  Also pt stated that you spoke with her about starting prolia? I don't see her bone density from 08/23/18 ( it was abstracted, will call for it) should I start the process for prolia?

## 2019-01-20 NOTE — Telephone Encounter (Signed)
prolia process form filled out and gave to [employer]

## 2019-01-20 NOTE — Telephone Encounter (Signed)
Pt needed to provide the rest of her insurance information previously to get the prolia authorized.  Looks like that did not occur when the conversation was started in the fall.  Yes, let's attempt again to proceed with prolia authorization.

## 2019-02-04 ENCOUNTER — Other Ambulatory Visit: Payer: Self-pay

## 2019-02-04 NOTE — Telephone Encounter (Signed)
Last filled 01/07/2019  [place] Database verified and compliance confirmed

## 2019-02-07 MED ORDER — CLONAZEPAM 1 MG PO TABS: ORAL_TABLET | ORAL | 0 refills | Status: DC

## 2019-02-24 ENCOUNTER — Ambulatory Visit (INDEPENDENT_AMBULATORY_CARE_PROVIDER_SITE_OTHER): Payer: Medicare Other | Admitting: *Deleted

## 2019-02-24 DIAGNOSIS — M810 Age-related osteoporosis without current pathological fracture: Secondary | ICD-10-CM | POA: Diagnosis not present

## 2019-02-24 MED ORDER — DENOSUMAB 60 MG/ML ~~LOC~~ SOSY
60.0000 mg | PREFILLED_SYRINGE | Freq: Once | SUBCUTANEOUS | Status: AC
  Administered 2019-02-24: 60 mg via SUBCUTANEOUS

## 2019-03-07 ENCOUNTER — Other Ambulatory Visit: Payer: Self-pay | Admitting: *Deleted

## 2019-03-07 MED ORDER — THYROID 60 MG PO TABS: ORAL_TABLET | ORAL | 1 refills | Status: DC

## 2019-03-07 MED ORDER — METRONIDAZOLE 0.75 % EX CREA: 1.0000 "application " | TOPICAL_CREAM | Freq: Every day | CUTANEOUS | 1 refills | Status: DC

## 2019-03-07 MED ORDER — CLONAZEPAM 1 MG PO TABS: ORAL_TABLET | ORAL | 0 refills | Status: DC

## 2019-03-07 NOTE — Telephone Encounter (Signed)
Patient requested refills.

## 2019-03-28 ENCOUNTER — Other Ambulatory Visit: Payer: Self-pay | Admitting: Certified Nurse Midwife

## 2019-03-28 MED ORDER — ESTRADIOL 0.1 MG/GM VA CREA: TOPICAL_CREAM | VAGINAL | 0 refills | Status: DC

## 2019-03-28 NOTE — Telephone Encounter (Signed)
Medication refill request: Estrace Last OV:  06/30/18 DL -- Rectocele Next AEX: none scheduled  Last MMG (if hormonal medication request): 08/23/18 BIRADS 1 negative/density c Refill authorized: Please advise on refill, DL out of office. Order pended for 42.5g w/0 refills

## 2019-03-28 NOTE — Telephone Encounter (Signed)
Patient is requesting a refill on estrace cream. [facility] at [place]

## 2019-04-05 ENCOUNTER — Other Ambulatory Visit: Payer: Self-pay | Admitting: Internal Medicine

## 2019-04-06 NOTE — Telephone Encounter (Signed)
Last refill 03/07/2019 [place] database verified  Waiting on provider approval

## 2019-05-05 ENCOUNTER — Other Ambulatory Visit: Payer: Self-pay | Admitting: Internal Medicine

## 2019-05-05 NOTE — Telephone Encounter (Signed)
Last filled 04/07/2019  [place] Database verified and compliance confirmed

## 2019-06-23 ENCOUNTER — Other Ambulatory Visit: Payer: Self-pay | Admitting: Obstetrics and Gynecology

## 2019-06-23 NOTE — Telephone Encounter (Signed)
Medication refill request: Estradiol cream  Last AEX:  06-30-18 DL  Next AEX: 07-25-2019 Last MMG (if hormonal medication request): 08-23-18 density C/BIRADS 1 negative  Refill authorized: 03-28-2019 #42.5g, 0RF. Today, please advise.   Medication pended for #42.5g, 0RF. Please refill if appropriate. Pharmacy confirmed.

## 2019-07-04 ENCOUNTER — Other Ambulatory Visit: Payer: Medicare Other

## 2019-07-07 ENCOUNTER — Encounter: Payer: Medicare Other | Admitting: Internal Medicine

## 2019-07-08 ENCOUNTER — Ambulatory Visit: Payer: Self-pay

## 2019-07-18 ENCOUNTER — Ambulatory Visit: Payer: Medicare Other | Admitting: Internal Medicine

## 2019-07-18 ENCOUNTER — Encounter: Payer: Medicare Other | Admitting: Internal Medicine

## 2019-07-18 ENCOUNTER — Ambulatory Visit: Payer: Self-pay

## 2019-07-18 ENCOUNTER — Encounter: Payer: Medicare Other | Admitting: Family

## 2019-07-18 ENCOUNTER — Encounter: Payer: Self-pay | Admitting: Family

## 2019-07-18 ENCOUNTER — Ambulatory Visit: Payer: Medicare Other | Admitting: Family

## 2019-07-25 ENCOUNTER — Other Ambulatory Visit: Payer: Self-pay

## 2019-07-25 ENCOUNTER — Ambulatory Visit (INDEPENDENT_AMBULATORY_CARE_PROVIDER_SITE_OTHER): Payer: Medicare Other | Admitting: Certified Nurse Midwife

## 2019-07-25 ENCOUNTER — Encounter: Payer: Self-pay | Admitting: Certified Nurse Midwife

## 2019-07-25 ENCOUNTER — Other Ambulatory Visit (HOSPITAL_COMMUNITY)
Admission: RE | Admit: 2019-07-25 | Discharge: 2019-07-25 | Disposition: A | Discharge status: Home / Self Care | Payer: Medicare Other | Source: Ambulatory Visit | Attending: Certified Nurse Midwife | Admitting: Certified Nurse Midwife

## 2019-07-25 VITALS — BP 120/76 | HR 64 | Temp 97.3°F | Resp 16 | Ht 65.25 in | Wt 130.0 lb

## 2019-07-25 DIAGNOSIS — Z01419 Encounter for gynecological examination (general) (routine) without abnormal findings: Secondary | ICD-10-CM

## 2019-07-25 DIAGNOSIS — Z780 Asymptomatic menopausal state: Secondary | ICD-10-CM | POA: Insufficient documentation

## 2019-07-25 DIAGNOSIS — Z124 Encounter for screening for malignant neoplasm of cervix: Secondary | ICD-10-CM | POA: Insufficient documentation

## 2019-07-25 DIAGNOSIS — N952 Postmenopausal atrophic vaginitis: Secondary | ICD-10-CM | POA: Diagnosis not present

## 2019-07-25 DIAGNOSIS — Z1151 Encounter for screening for human papillomavirus (HPV): Secondary | ICD-10-CM | POA: Insufficient documentation

## 2019-07-25 MED ORDER — ESTRADIOL 0.1 MG/GM VA CREA: TOPICAL_CREAM | VAGINAL | 1 refills | Status: DC

## 2019-07-25 NOTE — Progress Notes (Signed)
68 y.o. G0P0 Legally Separated  Caucasian Fe here for annual exam. Post menopausal, denies vaginal bleeding or dryness. Using Estrace cream working well for dryness and vaginal tone.  Patient's last menstrual period was 12/30/1999.          Sexually active: Yes.    The current method of family planning is post menopausal status.    Exercising: Yes.    strength training, treadmill Smoker:  no  Review of Systems  Constitutional: Negative.   HENT: Negative.   Eyes: Negative.   Respiratory: Negative.   Cardiovascular: Negative.   Gastrointestinal: Negative.   Genitourinary: Negative.   Musculoskeletal: Negative.   Skin: Negative.   Neurological: Negative.   Endo/Heme/Allergies: Negative.   Psychiatric/Behavioral: Negative.     Health Maintenance: Pap:  06-18-17 neg History of Abnormal Pap: no MMG:  08-23-18 category c density birads 1:neg Self Breast exams: yes Colonoscopy:  2015 negative  BMD:   2019 TDaP:  2012 Shingles: 2018 Pneumonia: 2019 Hep C and HIV: hep c neg 2018 Labs: if needed   reports that she has never smoked. She has never used smokeless tobacco. She reports current alcohol use. She reports that she does not use drugs.  Past Medical History:  Diagnosis Date  . Anxiety state, unspecified   . Barrett's esophagus   . Cystocele, midline   . Depressive disorder, not elsewhere classified   . Encounter for long-term (current) use of other medications   . Glycosuria   . Insomnia, unspecified   . Obstructive sleep apnea (adult) (pediatric)   . Oral aphthae   . Other and unspecified hyperlipidemia   . Proteinuria   . Reflux esophagitis   . Rosacea   . Senile osteoporosis   . STD (sexually transmitted disease)    HSV2  . Unspecified essential hypertension   . Unspecified hypothyroidism   . Viral hepatitis A without mention of hepatic coma     Past Surgical History:  Procedure Laterality Date  . BREAST ENHANCEMENT SURGERY  2010  . COSMETIC SURGERY  2009    for protruding ears  . GANGLION CYST EXCISION  2011   right palm  . teeth implants    . tooth implant  [id number]  . tummy tuck      Current Outpatient Medications  Medication Sig Dispense Refill  . B Complex Vitamins (B COMPLEX PO) Take by mouth. With vitamin c    . CALCIUM PO Take by mouth.    . Cholecalciferol (VITAMIN D PO) Take 2,000 Int'l Units by mouth.    . clonazePAM (KLONOPIN) 1 MG tablet TAKE 1 TABLET BY MOUTH UP TO FOUR TIMES DAILY AS NEEDED 120 tablet 5  . diphenhydrAMINE HCl, Sleep, (SOMINEX PO) Take by mouth. Takes 1/2 before bed    . estradiol (ESTRACE) 0.1 MG/GM vaginal cream PLACE 0.5(1/2) GRAM VAGINALLY 2 TIMES A WEEK AT BEDTIME 42.5 g 0  . Glucosamine-MSM-Hyaluronic Acd (JOINT HEALTH PO) Take by mouth.    . MELATONIN PO Take by mouth.    . Multiple Vitamins-Minerals (MULTIVITAMIN PO) Take by mouth.    . Probiotic Product (PROBIOTIC DAILY PO) Take by mouth daily.    [place] thyroid (ARMOUR THYROID) 60 MG tablet TAKE ONE TABLET BY MOUTH ONCE DAILY FOR THYROID 90 tablet 1   No current facility-administered medications for this visit.     Family History  Problem Relation Age of Onset  . Heart attack Father   . Heart attack Paternal Grandfather   . Heart disease Brother   .  Breast cancer Mother     ROS:  Pertinent items are noted in HPI.  Otherwise, a comprehensive ROS was negative.  Exam:   BP 120/76   Pulse 64   Temp (!) 97.3 F (36.3 C) (Skin)   Resp 16   Ht 5' 5.25" (1.657 m)   Wt 130 lb (59 kg)   LMP 12/30/1999   BMI 21.47 kg/m  Height: 5' 5.25" (165.7 cm) Ht Readings from Last 3 Encounters:  07/25/19 5' 5.25" (1.657 m)  07/05/18 5\' 6"  (1.676 m)  07/05/18 5\' 6"  (1.676 m)    General appearance: alert, cooperative and appears stated age Head: Normocephalic, without obvious abnormality, atraumatic Neck: no adenopathy, supple, symmetrical, trachea midline and thyroid normal to inspection and palpation Lungs: clear to auscultation  bilaterally Breasts: normal appearance, no masses or tenderness, No nipple retraction or dimpling, No nipple discharge or bleeding, No axillary or supraclavicular adenopathy Heart: regular rate and rhythm Abdomen: soft, non-tender; no masses,  no organomegaly Extremities: extremities normal, atraumatic, no cyanosis or edema Skin: Skin color, texture, turgor normal. No rashes or lesions Lymph nodes: Cervical, supraclavicular, and axillary nodes normal. No abnormal inguinal nodes palpated Neurologic: Grossly normal   Pelvic: External genitalia:  no lesions              Urethra:  normal appearing urethra with no masses, tenderness or lesions              Bartholin's and Skene's: normal                 Vagina: normal appearing vagina with normal color and discharge, no lesions              Cervix: no cervical motion tenderness, no lesions and normal appearance              Pap taken: Yes.   Bimanual Exam:  Uterus:  normal size, contour, position, consistency, mobility, non-tender and anteverted              Adnexa: normal adnexa and no mass, fullness, tenderness               Rectovaginal: Confirms               Anus:  normal sphincter tone, no lesions  Chaperone present: yes  A:  Well Woman with normal exam  Post menopausal  Atrophic vaginitis using Estrace cream with good response  Hypothyroid with PCP management, stable per patient  P:   Reviewed health and wellness pertinent to exam  Aware of need to advise if vaginal bleeding  Discussed improvement noted. Risks/benefits/warning signs with use reviewed, request continuance.  Rx Estrace see order with instructions  Continue follow up with PCP as indicated.  Pap smear: yes   counseled on breast self exam, mammography screening, feminine hygiene, menopause, adequate intake of calcium and vitamin D, diet and exercise  return annually or prn  An After Visit Summary was printed and given to the patient.

## 2019-07-27 LAB — CYTOLOGY - PAP
Diagnosis: NEGATIVE
HPV: NOT DETECTED

## 2019-08-02 ENCOUNTER — Other Ambulatory Visit: Payer: Self-pay | Admitting: Internal Medicine

## 2019-08-29 ENCOUNTER — Ambulatory Visit: Payer: Self-pay

## 2019-08-29 ENCOUNTER — Ambulatory Visit: Payer: Self-pay | Admitting: Family

## 2019-08-29 ENCOUNTER — Encounter: Payer: Self-pay | Admitting: Family

## 2019-09-12 ENCOUNTER — Ambulatory Visit (INDEPENDENT_AMBULATORY_CARE_PROVIDER_SITE_OTHER): Payer: Medicare Other | Admitting: Nurse Practitioner

## 2019-09-12 ENCOUNTER — Encounter: Payer: Self-pay | Admitting: Nurse Practitioner

## 2019-09-12 ENCOUNTER — Encounter: Payer: Self-pay | Admitting: Family

## 2019-09-12 ENCOUNTER — Other Ambulatory Visit: Payer: Self-pay

## 2019-09-12 VITALS — BP 122/76 | HR 94 | Temp 98.4°F | Ht 65.0 in | Wt 130.0 lb

## 2019-09-12 DIAGNOSIS — E039 Hypothyroidism, unspecified: Secondary | ICD-10-CM | POA: Diagnosis not present

## 2019-09-12 DIAGNOSIS — G4700 Insomnia, unspecified: Secondary | ICD-10-CM | POA: Diagnosis not present

## 2019-09-12 DIAGNOSIS — Z23 Encounter for immunization: Secondary | ICD-10-CM | POA: Diagnosis not present

## 2019-09-12 DIAGNOSIS — F419 Anxiety disorder, unspecified: Secondary | ICD-10-CM | POA: Diagnosis not present

## 2019-09-12 DIAGNOSIS — E785 Hyperlipidemia, unspecified: Secondary | ICD-10-CM | POA: Diagnosis not present

## 2019-09-12 DIAGNOSIS — M810 Age-related osteoporosis without current pathological fracture: Secondary | ICD-10-CM

## 2019-09-12 DIAGNOSIS — Z0189 Encounter for other specified special examinations: Secondary | ICD-10-CM

## 2019-09-12 DIAGNOSIS — I10 Essential (primary) hypertension: Secondary | ICD-10-CM

## 2019-09-12 DIAGNOSIS — Z0000 Encounter for general adult medical examination without abnormal findings: Secondary | ICD-10-CM | POA: Diagnosis not present

## 2019-09-12 DIAGNOSIS — R739 Hyperglycemia, unspecified: Secondary | ICD-10-CM

## 2019-09-12 MED ORDER — DENOSUMAB 60 MG/ML ~~LOC~~ SOSY
60.0000 mg | PREFILLED_SYRINGE | Freq: Once | SUBCUTANEOUS | Status: AC
  Administered 2019-09-12: 60 mg via SUBCUTANEOUS

## 2019-09-12 NOTE — Progress Notes (Signed)
Careteam: Patient Care Team: Gayland Curry, DO as PCP - General (Geriatric Medicine) [place], [place], [place] [facility])  Advanced Directive information Does Patient Have a Medical Advance Directive?: Yes, Type of Advance Directive: [place];Living will, Does patient want to make changes to medical advance directive?: No - Patient declined  Allergies  Allergen Reactions  . Adhesive [Tape]   . Lactose Intolerance (Gi)   . Sulfa Antibiotics     Chief Complaint  Patient presents with  . Medical Management of Chronic Issues    1 year follow-up   . Injections    Prolia injection today   . Labs Only    Discuss blood type labs   . Medication Management    Discuss Unisom and possible alternative     HPI: Patient is a 68 y.o. female seen in the office today for routine follow up   Hyperlipidemia- diet controlled and does 90 mins of exercise every other day.   Hyperglycemia- strong family hx of diabetes.   Hypothyroid- currently taking thyroid 60 mg daily   Osteoporosis- continues on prolia, got injection today with cal and vit d and weight bearing exercises.   Anxiety- uses clonazepam as needed but needs daily. Most of the time uses it to go back to sleep  Insomnia- mind starts racing. Stays up late and watches TV until 2 am. Does report anxiety but would not like to change medication. Started to use Unisom for sleep instead of benadryl.   Review of Systems:  Review of Systems  Constitutional: Negative for chills, diaphoresis, fever, malaise/fatigue and weight loss.  HENT: Negative for congestion and hearing loss.   Eyes: Negative for blurred vision.  Respiratory: Negative for cough and shortness of breath.   Cardiovascular: Negative for chest pain, palpitations and leg swelling.  Gastrointestinal: Negative for abdominal pain, blood in stool, constipation, diarrhea and melena.  Genitourinary: Negative for dysuria.  Musculoskeletal: Negative for back  pain, falls, joint pain, myalgias and neck pain.  Skin: Negative for itching and rash.  Neurological: Negative for dizziness and loss of consciousness.  Endo/Heme/Allergies: Does not bruise/bleed easily.  Psychiatric/Behavioral: Negative for memory loss. The patient is nervous/anxious and has insomnia.    Past Medical History:  Diagnosis Date  . Anxiety state, unspecified   . Barrett's esophagus   . Cystocele, midline   . Depressive disorder, not elsewhere classified   . Encounter for long-term (current) use of other medications   . Glycosuria   . Insomnia, unspecified   . Obstructive sleep apnea (adult) (pediatric)   . Oral aphthae   . Other and unspecified hyperlipidemia   . Proteinuria   . Reflux esophagitis   . Rosacea   . Senile osteoporosis   . STD (sexually transmitted disease)    HSV2  . Unspecified essential hypertension   . Unspecified hypothyroidism   . Viral hepatitis A without mention of hepatic coma    Past Surgical History:  Procedure Laterality Date  . BREAST ENHANCEMENT SURGERY  2010  . COSMETIC SURGERY  2009   for protruding ears  . GANGLION CYST EXCISION  2011   right palm  . teeth implants    . tooth implant  [id number]  . tummy tuck     Social History:   reports that she has never smoked. She has never used smokeless tobacco. She reports current alcohol use. She reports that she does not use drugs.  Family History  Problem Relation Age of Onset  . Heart  attack Father   . Heart attack Paternal Grandfather   . Heart disease Brother   . Breast cancer Mother     Medications: Patient's Medications  New Prescriptions   No medications on file  Previous Medications   B COMPLEX VITAMINS (B COMPLEX PO)    Take by mouth. With vitamin c   CALCIUM PO    Take by mouth.   CHOLECALCIFEROL (VITAMIN D PO)    Take 2,000 Int'l Units by mouth.   CLONAZEPAM (KLONOPIN) 1 MG TABLET    TAKE 1 TABLET BY MOUTH UP TO FOUR TIMES DAILY AS NEEDED   DOXYLAMINE, SLEEP,  (UNISOM) 25 MG TABLET    Take 25 mg by mouth at bedtime as needed.   ESTRADIOL (ESTRACE) 0.1 MG/GM VAGINAL CREAM    PLACE 0.5(1/2) GRAM VAGINALLY 2 TIMES A WEEK AT BEDTIME   GLUCOSAMINE-MSM-HYALURONIC ACD (JOINT HEALTH PO)    Take by mouth.   MELATONIN PO    Take by mouth.   METRONIDAZOLE (METROGEL) 0.75 % GEL    Apply 1 application topically as needed.   MULTIPLE VITAMINS-MINERALS (MULTIVITAMIN PO)    Take by mouth.   PROBIOTIC PRODUCT (PROBIOTIC DAILY PO)    Take by mouth daily.   THYROID (ARMOUR THYROID) 60 MG TABLET    TAKE 1 TABLET BY MOUTH EVERY DAY FOR THYROID  Modified Medications   No medications on file  Discontinued Medications   DIPHENHYDRAMINE HCL, SLEEP, (SOMINEX PO)    Take by mouth. Takes 1/2 before bed    Physical Exam:  Vitals:   09/12/19 1432  BP: 122/76  Pulse: 94  Temp: 98.4 F (36.9 C)  TempSrc: Temporal  SpO2: 98%  Weight: 130 lb (59 kg)  Height: 5\' 5"  (1.651 m)   Body mass index is 21.63 kg/m. Wt Readings from Last 3 Encounters:  09/12/19 130 lb (59 kg)  09/12/19 130 lb (59 kg)  07/25/19 130 lb (59 kg)    Physical Exam Constitutional:      General: She is not in acute distress.    Appearance: She is well-developed.  HENT:     Head: Normocephalic and atraumatic.     Right Ear: External ear normal.     Left Ear: External ear normal.     Nose: Nose normal.     Mouth/Throat:     Pharynx: No oropharyngeal exudate.  Eyes:     Conjunctiva/sclera: Conjunctivae normal.     Pupils: Pupils are equal, round, and reactive to light.     Comments: glasses  Neck:     Musculoskeletal: Normal range of motion and neck supple.     Thyroid: No thyromegaly.     Vascular: No JVD.     Trachea: No tracheal deviation.  Cardiovascular:     Rate and Rhythm: Normal rate and regular rhythm.     Heart sounds: Normal heart sounds.  Pulmonary:     Effort: Pulmonary effort is normal. No respiratory distress.     Breath sounds: Normal breath sounds. No stridor. No  wheezing or rales.  Abdominal:     General: Bowel sounds are normal. There is no distension.     Palpations: Abdomen is soft. There is no mass.     Tenderness: There is no abdominal tenderness. There is no guarding or rebound.  Musculoskeletal: Normal range of motion.        General: No tenderness.  Lymphadenopathy:     Cervical: No cervical adenopathy.  Skin:    General: Skin is  warm and dry.     Capillary Refill: Capillary refill takes less than 2 seconds.  Neurological:     Mental Status: She is alert and oriented to person, place, and time.     Cranial Nerves: No cranial nerve deficit.     Labs reviewed: Basic Metabolic Panel: No results for input(s): NA, K, CL, CO2, GLUCOSE, BUN, CREATININE, CALCIUM, MG, PHOS, TSH in the last 8760 hours. Liver Function Tests: No results for input(s): AST, ALT, ALKPHOS, BILITOT, PROT, ALBUMIN in the last 8760 hours. No results for input(s): LIPASE, AMYLASE in the last 8760 hours. No results for input(s): AMMONIA in the last 8760 hours. CBC: No results for input(s): WBC, NEUTROABS, HGB, HCT, MCV, PLT in the last 8760 hours. Lipid Panel: No results for input(s): CHOL, HDL, LDLCALC, TRIG, CHOLHDL, LDLDIRECT in the last 8760 hours. TSH: No results for input(s): TSH in the last 8760 hours. A1C: Lab Results  Component Value Date   HGBA1C 4.8 05/17/2018     Assessment/Plan 1. Senile osteoporosis -continues with prolia, cal with vit d and weight bearing activity - denosumab (PROLIA) injection 60 mg given today  2. Insomnia, unspecified type -ongoing, discussed sleep habits in detail with pt and gave information.  -also discussed Cognitive behavioral therapy  -to reduce melatonin to 3-6 mg by mouth at bedtime.  -encouraged to avoid unisom as it is similar to benadryl and should be avoiding in the geriatric age group   3. Hyperlipidemia, unspecified hyperlipidemia type -continues on diet and exercise modifications.  - Lipid panel  4.  Acquired hypothyroidism -continues on thyroid supplement.  - follow up TSH  5. Hyperglycemia -has had mildly elevated glucose in the past, follow up A1c - Hemoglobin A1c  6. Essential hypertension Stable with lifestyle modifications.  - COMPLETE METABOLIC PANEL WITH GFR - CBC with Differential/Platelet  7. Anxiety -stable, continues on clonazepam PRN- takes up to 4 daily if needed. Discussed the long term risk and side effects of medication with pt. Discuss that this is on the beers list and encouraged her to decrease use. Offered SSRI but she does not wish to change medication at this time.  8. Patient request for diagnostic testing -pt requesting blood type- willing to pay - ABO  9. Need for influenza vaccination - Flu Vaccine QUAD High Dose(Fluad)  Next appt: 6 months.  Carlos American. [place], [facility] [phone]

## 2019-09-12 NOTE — Progress Notes (Signed)
Subjective:   Donna Ware is a 68 y.o. female who presents for Medicare Annual (Subsequent) preventive examination.  Review of Systems:   Cardiac Risk Factors include: advanced age (>42men, >18 women);dyslipidemia     Objective:     Vitals: BP 122/76   Pulse 94   Temp 98.4 F (36.9 C) (Temporal)   Ht 5\' 5"  (1.651 m)   Wt 130 lb (59 kg)   LMP 12/30/1999   SpO2 98%   BMI 21.63 kg/m   Body mass index is 21.63 kg/m.  Advanced Directives 09/12/2019 09/12/2019 07/05/2018 05/17/2018 06/18/2017 08/28/2016 06/12/2016  Does Patient Have a Medical Advance Directive? Yes Yes Yes Yes Yes Yes Yes  Type of Paramedic of [place];Living will [place];Living will Living will;Healthcare Power of Attorney Living will;Healthcare Power of [place];Living will Living will [place]  Does patient want to make changes to medical advance directive? No - Patient declined No - Patient declined No - Patient declined No - Patient declined - - -  Copy of [place] in Chart? Yes - validated most recent copy scanned in chart (See row information) Yes - validated most recent copy scanned in chart (See row information) Yes Yes Yes Yes Yes    Tobacco Social History   Tobacco Use  Smoking Status Never Smoker  Smokeless Tobacco Never Used     Counseling given: Not Answered   Clinical Intake:  Pre-visit preparation completed: Yes  Pain : No/denies pain     BMI - recorded: 21.63 Nutritional Status: BMI of 19-24  Normal Nutritional Risks: None Diabetes: No  How often do you need to have someone help you when you read instructions, pamphlets, or other written materials from your doctor or pharmacy?: 1 - Never What is the last grade level you completed in school?: some college  Interpreter Needed?: No     Past Medical History:  Diagnosis Date  . Anxiety state, unspecified   . Barrett's  esophagus   . Cystocele, midline   . Depressive disorder, not elsewhere classified   . Encounter for long-term (current) use of other medications   . Glycosuria   . Insomnia, unspecified   . Obstructive sleep apnea (adult) (pediatric)   . Oral aphthae   . Other and unspecified hyperlipidemia   . Proteinuria   . Reflux esophagitis   . Rosacea   . Senile osteoporosis   . STD (sexually transmitted disease)    HSV2  . Unspecified essential hypertension   . Unspecified hypothyroidism   . Viral hepatitis A without mention of hepatic coma    Past Surgical History:  Procedure Laterality Date  . BREAST ENHANCEMENT SURGERY  2010  . COSMETIC SURGERY  2009   for protruding ears  . GANGLION CYST EXCISION  2011   right palm  . teeth implants    . tooth implant  [id number]  . tummy tuck     Family History  Problem Relation Age of Onset  . Heart attack Father   . Heart attack Paternal Grandfather   . Heart disease Brother   . Breast cancer Mother    Social History   Socioeconomic History  . Marital status: Legally Separated    Spouse name: Not on file  . Number of children: Not on file  . Years of education: Not on file  . Highest education level: Not on file  Occupational History  . Not on file  Social Needs  . Financial resource strain: Not hard at all  . Food insecurity    Worry: Never true    Inability: Never true  . Transportation needs    Medical: No    Non-medical: No  Tobacco Use  . Smoking status: Never Smoker  . Smokeless tobacco: Never Used  Substance and Sexual Activity  . Alcohol use: Yes    Comment: couple drinks a month  . Drug use: No  . Sexual activity: Yes    Partners: Male    Birth control/protection: Post-menopausal  Lifestyle  . Physical activity    Days per week: 4 days    Minutes per session: 60 min  . Stress: Only a little  Relationships  . Social Herbalist on phone: Twice a week    Gets together: Once a week    Attends  religious service: Never    Active member of club or organization: No    Attends meetings of clubs or organizations: Never    Relationship status: Separated  Other Topics Concern  . Not on file  Social History Narrative  . Not on file    Outpatient Encounter Medications as of 09/12/2019  Medication Sig  . B Complex Vitamins (B COMPLEX PO) Take by mouth. With vitamin c  . CALCIUM PO Take by mouth.  . Cholecalciferol (VITAMIN D PO) Take 2,000 Int'l Units by mouth.  . clonazePAM (KLONOPIN) 1 MG tablet TAKE 1 TABLET BY MOUTH UP TO FOUR TIMES DAILY AS NEEDED  . doxylamine, Sleep, (UNISOM) 25 MG tablet Take 25 mg by mouth at bedtime as needed.  Marland Kitchen estradiol (ESTRACE) 0.1 MG/GM vaginal cream PLACE 0.5(1/2) GRAM VAGINALLY 2 TIMES A WEEK AT BEDTIME  . Glucosamine-MSM-Hyaluronic Acd (JOINT HEALTH PO) Take by mouth.  . MELATONIN PO Take by mouth.  . metroNIDAZOLE (METROGEL) 0.75 % gel Apply 1 application topically as needed.  . Multiple Vitamins-Minerals (MULTIVITAMIN PO) Take by mouth.  . Probiotic Product (PROBIOTIC DAILY PO) Take by mouth daily.  Marland Kitchen thyroid (ARMOUR THYROID) 60 MG tablet TAKE 1 TABLET BY MOUTH EVERY DAY FOR THYROID   No facility-administered encounter medications on file as of 09/12/2019.     Activities of Daily Living In your present state of health, do you have any difficulty performing the following activities: 09/12/2019  Hearing? N  Vision? N  Difficulty concentrating or making decisions? N  Walking or climbing stairs? N  Dressing or bathing? N  Doing errands, shopping? N  Preparing Food and eating ? N  Using the Toilet? N  In the past six months, have you accidently leaked urine? N  Do you have problems with loss of bowel control? N  Managing your Medications? N  Managing your Finances? N  Housekeeping or managing your Housekeeping? N  Some recent data might be hidden    Patient Care Team: Gayland Curry, DO as PCP - General (Geriatric Medicine) [place],  [place], [place] [facility])    Assessment:   This is a routine wellness examination for Zoee.  Exercise Activities and Dietary recommendations Current Exercise Habits: Home exercise routine, Type of exercise: treadmill;yoga;calisthenics;strength training/weights, Time (Minutes): > 60, Frequency (Times/Week): 4, Weekly Exercise (Minutes/Week): 0, Intensity: Moderate  Goals    . 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 0  0 No No No  Number falls in past yr: 0 0 - - -  Injury with Fall? 0 0 - - -   Is the patient's home free of loose throw rugs in walkways, pet beds, electrical cords, etc?   yes      Grab bars in the bathroom? yes      Handrails on the stairs?   yes      Adequate lighting?   yes  Timed Get Up and Go performed: na  Depression Screen PHQ 2/9 Scores 09/12/2019 09/12/2019 07/05/2018 05/17/2018  PHQ - 2 Score 0 0 0 0     Cognitive Function MMSE - Mini Mental State Exam 09/12/2019 07/05/2018 06/18/2017  Orientation to time 5 5 5   Orientation to Place 5 5 5   Registration 3 3 3   Attention/ Calculation 5 5 5   Recall 3 3 3   Language- name 2 objects 2 2 2   Language- repeat 1 1 1   Language- follow 3 step command 3 3 3   Language- read & follow direction 1 1 1   Write a sentence 1 1 1   Copy design 1 1 1   Total score 30 30 30         Immunization History  Administered Date(s) Administered  . Influenza, High Dose Seasonal PF 09/15/2017, 09/20/2018  . Influenza,inj,Quad PF,6+ Mos 09/14/2015, 08/28/2016  . Influenza-Unspecified 09/21/2012  . Pneumococcal Conjugate-13 06/18/2017  . Pneumococcal Polysaccharide-23 12/29/1994, 07/05/2018  . Tdap 12/29/1994, 11/25/2011  . Zoster 03/15/2008  . Zoster Recombinat (Shingrix) 09/09/2017, 12/17/2017    Qualifies for Shingles Vaccine? Up to date  Screening Tests Health Maintenance  Topic  Date Due  . INFLUENZA VACCINE  07/30/2019  . MAMMOGRAM  08/23/2020  . TETANUS/TDAP  11/24/2021  . COLONOSCOPY  08/24/2024  . DEXA SCAN  Completed  . Hepatitis C Screening  Completed  . PNA vac Low Risk Adult  Completed    Cancer Screenings: Lung: Low Dose CT Chest recommended if Age 56-80 years, 30 pack-year currently smoking OR have quit w/in 15years. Patient does not qualify. Breast:  Up to date on Mammogram? Yes   Up to date of Bone Density/Dexa? Yes Colorectal: up to date  Additional Screenings:  Hepatitis C Screening: done     Plan:      I have personally reviewed and noted the following in the patient's chart:   . Medical and social history . Use of alcohol, tobacco or illicit drugs  . Current medications and supplements . Functional ability and status . Nutritional status . Physical activity . Advanced directives . List of other physicians . Hospitalizations, surgeries, and ER visits in previous 12 months . Vitals . Screenings to include cognitive, depression, and falls . Referrals and appointments  In addition, I have reviewed and discussed with patient certain preventive protocols, quality metrics, and best practice recommendations. A written personalized care plan for preventive services as well as general preventive health recommendations were provided to patient.     Lauree Chandler, NP  09/12/2019

## 2019-09-12 NOTE — Patient Instructions (Signed)
Donna Ware , Thank you for taking time to come for your Medicare Wellness Visit. I appreciate your ongoing commitment to your health goals. Please review the following plan we discussed and let me know if I can assist you in the future.   Screening recommendations/referrals: Colonoscopy- up to date Mammogram up to date Bone Density up to date Recommended yearly ophthalmology/optometry visit for glaucoma screening and checkup Recommended yearly dental visit for hygiene and checkup  Vaccinations: Influenza vaccine - DUE- to get as soon as possible.  Pneumococcal vaccine up to date Tdap vaccine up to date Shingles vaccine up to date    Advanced directives: on file  Conditions/risks identified: none   Next appointment: 1 year   Preventive Care 16 Years and Older, Female Preventive care refers to lifestyle choices and visits with your health care provider that can promote health and wellness. What does preventive care include?  A yearly physical exam. This is also called an annual well check.  Dental exams once or twice a year.  Routine eye exams. Ask your health care provider how often you should have your eyes checked.  Personal lifestyle choices, including:  Daily care of your teeth and gums.  Regular physical activity.  Eating a healthy diet.  Avoiding tobacco and drug use.  Limiting alcohol use.  Practicing safe sex.  Taking low-dose aspirin every day.  Taking vitamin and mineral supplements as recommended by your health care provider. What happens during an annual well check? The services and screenings done by your health care provider during your annual well check will depend on your age, overall health, lifestyle risk factors, and family history of disease. Counseling  Your health care provider may ask you questions about your:  Alcohol use.  Tobacco use.  Drug use.  Emotional well-being.  Home and relationship well-being.  Sexual activity.   Eating habits.  History of falls.  Memory and ability to understand (cognition).  Work and work Statistician.  Reproductive health. Screening  You may have the following tests or measurements:  Height, weight, and BMI.  Blood pressure.  Lipid and cholesterol levels. These may be checked every 5 years, or more frequently if you are over 19 years old.  Skin check.  Lung cancer screening. You may have this screening every year starting at age 52 if you have a 30-pack-year history of smoking and currently smoke or have quit within the past 15 years.  Fecal occult blood test (FOBT) of the stool. You may have this test every year starting at age 62.  Flexible sigmoidoscopy or colonoscopy. You may have a sigmoidoscopy every 5 years or a colonoscopy every 10 years starting at age 46.  Hepatitis C blood test.  Hepatitis B blood test.  Sexually transmitted disease (STD) testing.  Diabetes screening. This is done by checking your blood sugar (glucose) after you have not eaten for a while (fasting). You may have this done every 1-3 years.  Bone density scan. This is done to screen for osteoporosis. You may have this done starting at age 23.  Mammogram. This may be done every 1-2 years. Talk to your health care provider about how often you should have regular mammograms. Talk with your health care provider about your test results, treatment options, and if necessary, the need for more tests. Vaccines  Your health care provider may recommend certain vaccines, such as:  Influenza vaccine. This is recommended every year.  Tetanus, diphtheria, and acellular pertussis (Tdap, Td) vaccine. You may need  a Td booster every 10 years.  Zoster vaccine. You may need this after age 71.  Pneumococcal 13-valent conjugate (PCV13) vaccine. One dose is recommended after age 59.  Pneumococcal polysaccharide (PPSV23) vaccine. One dose is recommended after age 64. Talk to your health care provider  about which screenings and vaccines you need and how often you need them. This information is not intended to replace advice given to you by your health care provider. Make sure you discuss any questions you have with your health care provider. Document Released: 01/11/2016 Document Revised: 09/03/2016 Document Reviewed: 10/16/2015 Elsevier Interactive Patient Education  2017 [place] Prevention in the Home Falls can cause injuries. They can happen to people of all ages. There are many things you can do to make your home safe and to help prevent falls. What can I do on the outside of my home?  Regularly fix the edges of walkways and driveways and fix any cracks.  Remove anything that might make you trip as you walk through a door, such as a raised step or threshold.  Trim any bushes or trees on the path to your home.  Use bright outdoor lighting.  Clear any walking paths of anything that might make someone trip, such as rocks or tools.  Regularly check to see if handrails are loose or broken. Make sure that both sides of any steps have handrails.  Any raised decks and porches should have guardrails on the edges.  Have any leaves, snow, or ice cleared regularly.  Use sand or salt on walking paths during winter.  Clean up any spills in your garage right away. This includes oil or grease spills. What can I do in the bathroom?  Use night lights.  Install grab bars by the toilet and in the tub and shower. Do not use towel bars as grab bars.  Use non-skid mats or decals in the tub or shower.  If you need to sit down in the shower, use a plastic, non-slip stool.  Keep the floor dry. Clean up any water that spills on the floor as soon as it happens.  Remove soap buildup in the tub or shower regularly.  Attach bath mats securely with double-sided non-slip rug tape.  Do not have throw rugs and other things on the floor that can make you trip. What can I do in the  bedroom?  Use night lights.  Make sure that you have a light by your bed that is easy to reach.  Do not use any sheets or blankets that are too big for your bed. They should not hang down onto the floor.  Have a firm chair that has side arms. You can use this for support while you get dressed.  Do not have throw rugs and other things on the floor that can make you trip. What can I do in the kitchen?  Clean up any spills right away.  Avoid walking on wet floors.  Keep items that you use a lot in easy-to-reach places.  If you need to reach something above you, use a strong step stool that has a grab bar.  Keep electrical cords out of the way.  Do not use floor polish or wax that makes floors slippery. If you must use wax, use non-skid floor wax.  Do not have throw rugs and other things on the floor that can make you trip. What can I do with my stairs?  Do not leave any items on the  stairs.  Make sure that there are handrails on both sides of the stairs and use them. Fix handrails that are broken or loose. Make sure that handrails are as long as the stairways.  Check any carpeting to make sure that it is firmly attached to the stairs. Fix any carpet that is loose or worn.  Avoid having throw rugs at the top or bottom of the stairs. If you do have throw rugs, attach them to the floor with carpet tape.  Make sure that you have a light switch at the top of the stairs and the bottom of the stairs. If you do not have them, ask someone to add them for you. What else can I do to help prevent falls?  Wear shoes that:  Do not have high heels.  Have rubber bottoms.  Are comfortable and fit you well.  Are closed at the toe. Do not wear sandals.  If you use a stepladder:  Make sure that it is fully opened. Do not climb a closed stepladder.  Make sure that both sides of the stepladder are locked into place.  Ask someone to hold it for you, if possible.  Clearly mark and make  sure that you can see:  Any grab bars or handrails.  First and last steps.  Where the edge of each step is.  Use tools that help you move around (mobility aids) if they are needed. These include:  Canes.  Walkers.  Scooters.  Crutches.  Turn on the lights when you go into a dark area. Replace any light bulbs as soon as they burn out.  Set up your furniture so you have a clear path. Avoid moving your furniture around.  If any of your floors are uneven, fix them.  If there are any pets around you, be aware of where they are.  Review your medicines with your doctor. Some medicines can make you feel dizzy. This can increase your chance of falling. Ask your doctor what other things that you can do to help prevent falls. This information is not intended to replace advice given to you by your health care provider. Make sure you discuss any questions you have with your health care provider. Document Released: 10/11/2009 Document Revised: 05/22/2016 Document Reviewed: 01/19/2015 Elsevier Interactive Patient Education  2017 [employer].

## 2019-09-12 NOTE — Patient Instructions (Signed)
Only get in bed for sleep Bedtime routine Turn off TV and computer before bed Decrease melatonin to 3-6 mg nightly No caffeine in the afternoon To look into cognitive behavioral therapy if needed    Insomnia Insomnia is a sleep disorder that makes it difficult to fall asleep or stay asleep. Insomnia can cause fatigue, low energy, difficulty concentrating, mood swings, and poor performance at work or school. There are three different ways to classify insomnia:  Difficulty falling asleep.  Difficulty staying asleep.  Waking up too early in the morning. Any type of insomnia can be long-term (chronic) or short-term (acute). Both are common. Short-term insomnia usually lasts for three months or less. Chronic insomnia occurs at least three times a week for longer than three months. What are the causes? Insomnia may be caused by another condition, situation, or substance, such as:  Anxiety.  Certain medicines.  Gastroesophageal reflux disease (GERD) or other gastrointestinal conditions.  Asthma or other breathing conditions.  Restless legs syndrome, sleep apnea, or other sleep disorders.  Chronic pain.  Menopause.  Stroke.  Abuse of alcohol, tobacco, or illegal drugs.  Mental health conditions, such as depression.  Caffeine.  Neurological disorders, such as Alzheimer's disease.  An overactive thyroid (hyperthyroidism). Sometimes, the cause of insomnia may not be known. What increases the risk? Risk factors for insomnia include:  Gender. Women are affected more often than men.  Age. Insomnia is more common as you get older.  Stress.  Lack of exercise.  Irregular work schedule or working night shifts.  Traveling between different time zones.  Certain medical and mental health conditions. What are the signs or symptoms? If you have insomnia, the main symptom is having trouble falling asleep or having trouble staying asleep. This may lead to other symptoms, such  as:  Feeling fatigued or having low energy.  Feeling nervous about going to sleep.  Not feeling rested in the morning.  Having trouble concentrating.  Feeling irritable, anxious, or depressed. How is this diagnosed? This condition may be diagnosed based on:  Your symptoms and medical history. Your health care provider may ask about: ? Your sleep habits. ? Any medical conditions you have. ? Your mental health.  A physical exam. How is this treated? Treatment for insomnia depends on the cause. Treatment may focus on treating an underlying condition that is causing insomnia. Treatment may also include:  Medicines to help you sleep.  Counseling or therapy.  Lifestyle adjustments to help you sleep better. Follow these instructions at home: Eating and drinking   Limit or avoid alcohol, caffeinated beverages, and cigarettes, especially close to bedtime. These can disrupt your sleep.  Do not eat a large meal or eat spicy foods right before bedtime. This can lead to digestive discomfort that can make it hard for you to sleep. Sleep habits   Keep a sleep diary to help you and your health care provider figure out what could be causing your insomnia. Write down: ? When you sleep. ? When you wake up during the night. ? How well you sleep. ? How rested you feel the next day. ? Any side effects of medicines you are taking. ? What you eat and drink.  Make your bedroom a dark, comfortable place where it is easy to fall asleep. ? Put up shades or blackout curtains to block light from outside. ? Use a white noise machine to block noise. ? Keep the temperature cool.  Limit screen use before bedtime. This includes: ? Watching  TV. ? Using your smartphone, tablet, or computer.  Stick to a routine that includes going to bed and waking up at the same times every day and night. This can help you fall asleep faster. Consider making a quiet activity, such as reading, part of your nighttime  routine.  Try to avoid taking naps during the day so that you sleep better at night.  Get out of bed if you are still awake after 15 minutes of trying to sleep. Keep the lights down, but try reading or doing a quiet activity. When you feel sleepy, go back to bed. General instructions  Take over-the-counter and prescription medicines only as told by your health care provider.  Exercise regularly, as told by your health care provider. Avoid exercise starting several hours before bedtime.  Use relaxation techniques to manage stress. Ask your health care provider to suggest some techniques that may work well for you. These may include: ? Breathing exercises. ? Routines to release muscle tension. ? Visualizing peaceful scenes.  Make sure that you drive carefully. Avoid driving if you feel very sleepy.  Keep all follow-up visits as told by your health care provider. This is important. Contact a health care provider if:  You are tired throughout the day.  You have trouble in your daily routine due to sleepiness.  You continue to have sleep problems, or your sleep problems get worse. Get help right away if:  You have serious thoughts about hurting yourself or someone else. If you ever feel like you may hurt yourself or others, or have thoughts about taking your own life, get help right away. You can go to your nearest emergency department or call:  Your local emergency services (911 in the [place]  A suicide crisis helpline, such as the [place] at 425-574-2463. This is open 24 hours a day. Summary  Insomnia is a sleep disorder that makes it difficult to fall asleep or stay asleep.  Insomnia can be long-term (chronic) or short-term (acute).  Treatment for insomnia depends on the cause. Treatment may focus on treating an underlying condition that is causing insomnia.  Keep a sleep diary to help you and your health care provider figure out what could be  causing your insomnia. This information is not intended to replace advice given to you by your health care provider. Make sure you discuss any questions you have with your health care provider. Document Released: 12/12/2000 Document Revised: 11/27/2017 Document Reviewed: 09/24/2017 Elsevier Patient Education  2020 [employer].

## 2019-09-13 LAB — COMPLETE METABOLIC PANEL WITH GFR
AG Ratio: 1.8 (calc) (ref 1.0–2.5)
ALT: 11 U/L (ref 6–29)
AST: 16 U/L (ref 10–35)
Albumin: 4.5 g/dL (ref 3.6–5.1)
Alkaline phosphatase (APISO): 46 U/L (ref 37–153)
BUN: 10 mg/dL (ref 7–25)
CO2: 28 mmol/L (ref 20–32)
Calcium: 9.8 mg/dL (ref 8.6–10.4)
Chloride: 105 mmol/L (ref 98–110)
Creat: 0.71 mg/dL (ref 0.50–0.99)
GFR, Est African American: 102 mL/min/{1.73_m2} (ref >=60)
GFR, Est Non African American: 88 mL/min/{1.73_m2} (ref >=60)
Globulin: 2.5 g/dL (calc) (ref 1.9–3.7)
Glucose, Bld: 98 mg/dL (ref 65–139)
Potassium: 4.2 mmol/L (ref 3.5–5.3)
Sodium: 140 mmol/L (ref 135–146)
Total Bilirubin: 0.5 mg/dL (ref 0.2–1.2)
Total Protein: 7 g/dL (ref 6.1–8.1)

## 2019-09-13 LAB — LIPID PANEL
Cholesterol: 205 mg/dL — ABNORMAL HIGH (ref <200)
HDL: 71 mg/dL (ref >=50)
LDL Cholesterol (Calc): 117 mg/dL (calc) — ABNORMAL HIGH
Non-HDL Cholesterol (Calc): 134 mg/dL (calc) — ABNORMAL HIGH (ref <130)
Total CHOL/HDL Ratio: 2.9 (calc) (ref <5.0)
Triglycerides: 76 mg/dL (ref <150)

## 2019-09-13 LAB — CBC WITH DIFFERENTIAL/PLATELET
Absolute Monocytes: 287 cells/uL (ref 200–950)
Basophils Absolute: 19 cells/uL (ref 0–200)
Basophils Relative: 0.4 %
Eosinophils Absolute: 9 cells/uL — ABNORMAL LOW (ref 15–500)
Eosinophils Relative: 0.2 %
HCT: 40.4 % (ref 35.0–45.0)
Hemoglobin: 13.9 g/dL (ref 11.7–15.5)
Lymphs Abs: 606 cells/uL — ABNORMAL LOW (ref 850–3900)
MCH: 33.3 pg — ABNORMAL HIGH (ref 27.0–33.0)
MCHC: 34.4 g/dL (ref 32.0–36.0)
MCV: 96.7 fL (ref 80.0–100.0)
MPV: 12.6 fL — ABNORMAL HIGH (ref 7.5–12.5)
Monocytes Relative: 6.1 %
Neutro Abs: 3779 cells/uL (ref 1500–7800)
Neutrophils Relative %: 80.4 %
Platelets: 236 10*3/uL (ref 140–400)
RBC: 4.18 10*6/uL (ref 3.80–5.10)
RDW: 11 % (ref 11.0–15.0)
Total Lymphocyte: 12.9 %
WBC: 4.7 10*3/uL (ref 3.8–10.8)

## 2019-09-13 LAB — HEMOGLOBIN A1C
Hgb A1c MFr Bld: 4.9 % of total Hgb (ref <5.7)
Mean Plasma Glucose: 94 (calc)
eAG (mmol/L): 5.2 (calc)

## 2019-09-13 LAB — ABO

## 2019-09-13 LAB — TSH: TSH: 2.09 mIU/L (ref 0.40–4.50)

## 2019-09-13 NOTE — Addendum Note (Signed)
Addended by: Lauree Chandler on: 09/13/2019 01:21 PM   Modules accepted: Level of Service

## 2019-09-26 DIAGNOSIS — Z1231 Encounter for screening mammogram for malignant neoplasm of breast: Secondary | ICD-10-CM | POA: Diagnosis not present

## 2019-09-26 DIAGNOSIS — Z803 Family history of malignant neoplasm of breast: Secondary | ICD-10-CM | POA: Diagnosis not present

## 2019-09-26 LAB — HM MAMMOGRAPHY

## 2019-09-28 ENCOUNTER — Encounter: Payer: Self-pay | Admitting: *Deleted

## 2019-10-06 DIAGNOSIS — N6001 Solitary cyst of right breast: Secondary | ICD-10-CM | POA: Diagnosis not present

## 2019-10-06 DIAGNOSIS — N6315 Unspecified lump in the right breast, overlapping quadrants: Secondary | ICD-10-CM | POA: Diagnosis not present

## 2019-10-06 DIAGNOSIS — N6489 Other specified disorders of breast: Secondary | ICD-10-CM | POA: Diagnosis not present

## 2019-10-06 DIAGNOSIS — Z803 Family history of malignant neoplasm of breast: Secondary | ICD-10-CM | POA: Diagnosis not present

## 2019-10-06 DIAGNOSIS — N6310 Unspecified lump in the right breast, unspecified quadrant: Secondary | ICD-10-CM | POA: Diagnosis not present

## 2019-10-06 LAB — HM MAMMOGRAPHY

## 2019-10-07 ENCOUNTER — Encounter: Payer: Self-pay | Admitting: *Deleted

## 2019-10-13 ENCOUNTER — Encounter: Payer: Self-pay | Admitting: Internal Medicine

## 2019-11-02 ENCOUNTER — Other Ambulatory Visit: Payer: Self-pay | Admitting: *Deleted

## 2019-11-02 MED ORDER — CLONAZEPAM 1 MG PO TABS: ORAL_TABLET | ORAL | 3 refills | Status: DC

## 2019-11-02 NOTE — Telephone Encounter (Signed)
[facility]

## 2019-11-19 ENCOUNTER — Other Ambulatory Visit: Payer: Self-pay

## 2019-11-19 ENCOUNTER — Encounter (HOSPITAL_COMMUNITY): Payer: Self-pay | Admitting: Emergency Medicine

## 2019-11-19 ENCOUNTER — Ambulatory Visit (HOSPITAL_COMMUNITY)
Admission: EM | Admit: 2019-11-19 | Discharge: 2019-11-19 | Disposition: A | Discharge status: Home / Self Care | Payer: Medicare Other | Attending: Family Medicine | Admitting: Family Medicine

## 2019-11-19 DIAGNOSIS — N3090 Cystitis, unspecified without hematuria: Secondary | ICD-10-CM | POA: Diagnosis not present

## 2019-11-19 LAB — POCT URINALYSIS DIP (DEVICE)
Bilirubin Urine: NEGATIVE
Glucose, UA: 100 mg/dL — AB
Ketones, ur: NEGATIVE mg/dL
Nitrite: POSITIVE — AB
Protein, ur: 30 mg/dL — AB
Specific Gravity, Urine: 1.01 (ref 1.005–1.030)
Urobilinogen, UA: 1 mg/dL (ref 0.0–1.0)
pH: 7 (ref 5.0–8.0)

## 2019-11-19 MED ORDER — CEPHALEXIN 500 MG PO CAPS: 500.0000 mg | ORAL_CAPSULE | Freq: Two times a day (BID) | ORAL | 0 refills | Status: DC

## 2019-11-19 NOTE — ED Triage Notes (Signed)
Symptoms started last week.  Patient reports symptoms are burning with urination, urgency, frequency, strong urine odor, pain in right flank

## 2019-11-21 LAB — URINE CULTURE: Culture: >=100000 — AB

## 2019-11-21 NOTE — ED Provider Notes (Signed)
[facility]    ASSESSMENT & PLAN:  1. Cystitis      Meds ordered this encounter  Medications  . cephALEXin (KEFLEX) 500 MG capsule    Sig: Take 1 capsule (500 mg total) by mouth 2 (two) times daily.    Dispense:  10 capsule    Refill:  0    No signs of pyelonephritis. Discussed. Urine culture sent. Will notify patient when results available. Will follow up with her PCP or here if not showing improvement over the next 48 hours, sooner if needed.  Outlined signs and symptoms indicating need for more acute intervention. Patient verbalized understanding. After Visit Summary given.  SUBJECTIVE:  Donna Ware is a 68 y.o. female who complains of urinary frequency, urgency and dysuria for the past one week. Also strong urine odor. Without associated flank pain, fever, chills, vaginal discharge or bleeding. Gross hematuria: not present. No specific aggravating or alleviating factors reported. No LE edema. Normal PO intake without n/v/d. Without specific abdominal pain. Ambulatory without difficulty. OTC treatment: none. H/O UTI: rare.  LMP: Patient's last menstrual period was 12/30/1999.  ROS: As in HPI.  OBJECTIVE:  Vitals:   11/19/19 1801  BP: 122/81  Pulse: (!) 101  Resp: 18  Temp: 99 F (37.2 C)  TempSrc: Oral  SpO2: 97%   General appearance: alert; no distress HENT: oropharynx: moist Lungs: unlabored respirations Abdomen: soft, non-tender; bowel sounds normal; no masses or organomegaly; no guarding or rebound tenderness Back: no CVA tenderness Extremities: no edema; symmetrical with no gross deformities Skin: warm and dry Neurologic: normal gait Psychological: alert and cooperative; normal mood and affect  Labs Reviewed  POCT URINALYSIS DIP (DEVICE) - Abnormal; Notable for the following components:      Result Value   Glucose, UA 100 (*)    Hgb urine dipstick TRACE (*)    Protein, ur 30 (*)    Nitrite POSITIVE (*)    Leukocytes,Ua LARGE  (*)    All other components within normal limits  URINE CULTURE    Allergies  Allergen Reactions  . Adhesive [Tape]   . Lactose Intolerance (Gi)   . Sulfa Antibiotics     Past Medical History:  Diagnosis Date  . Anxiety state, unspecified   . Barrett's esophagus   . Cystocele, midline   . Depressive disorder, not elsewhere classified   . Encounter for long-term (current) use of other medications   . Glycosuria   . Insomnia, unspecified   . Obstructive sleep apnea (adult) (pediatric)   . Oral aphthae   . Other and unspecified hyperlipidemia   . Proteinuria   . Reflux esophagitis   . Rosacea   . Senile osteoporosis   . STD (sexually transmitted disease)    HSV2  . Unspecified essential hypertension   . Unspecified hypothyroidism   . Viral hepatitis A without mention of hepatic coma    Social History   Socioeconomic History  . Marital status: Legally Separated    Spouse name: Not on file  . Number of children: Not on file  . Years of education: Not on file  . Highest education level: Not on file  Occupational History  . Not on file  Social Needs  . Financial resource strain: Not hard at all  . Food insecurity    Worry: Never true    Inability: Never true  . Transportation needs    Medical: No    Non-medical: No  Tobacco Use  . Smoking status: Never  Smoker  . Smokeless tobacco: Never Used  Substance and Sexual Activity  . Alcohol use: Yes    Comment: couple drinks a month  . Drug use: No  . Sexual activity: Yes    Partners: Male    Birth control/protection: Post-menopausal  Lifestyle  . Physical activity    Days per week: 4 days    Minutes per session: 60 min  . Stress: Only a little  Relationships  . Social Herbalist on phone: Twice a week    Gets together: Once a week    Attends religious service: Never    Active member of club or organization: No    Attends meetings of clubs or organizations: Never    Relationship status: Separated   . Intimate partner violence    Fear of current or ex partner: No    Emotionally abused: No    Physically abused: No    Forced sexual activity: No  Other Topics Concern  . Not on file  Social History Narrative  . Not on file   Family History  Problem Relation Age of Onset  . Heart attack Father   . Heart attack Paternal Grandfather   . Heart disease Brother   . Breast cancer Mother        Vanessa Kick, MD 11/21/19 [phone]

## 2019-12-03 ENCOUNTER — Encounter (HOSPITAL_COMMUNITY): Payer: Self-pay | Admitting: *Deleted

## 2019-12-03 ENCOUNTER — Ambulatory Visit (HOSPITAL_COMMUNITY)
Admission: EM | Admit: 2019-12-03 | Discharge: 2019-12-03 | Disposition: A | Discharge status: Home / Self Care | Payer: Medicare Other | Attending: Family Medicine | Admitting: Family Medicine

## 2019-12-03 ENCOUNTER — Other Ambulatory Visit: Payer: Self-pay

## 2019-12-03 DIAGNOSIS — N3090 Cystitis, unspecified without hematuria: Secondary | ICD-10-CM

## 2019-12-03 LAB — POCT URINALYSIS DIP (DEVICE)
Glucose, UA: 100 mg/dL — AB
Nitrite: POSITIVE — AB
Protein, ur: 100 mg/dL — AB
Specific Gravity, Urine: 1.01 (ref 1.005–1.030)
Urobilinogen, UA: 2 mg/dL — ABNORMAL HIGH (ref 0.0–1.0)
pH: 6 (ref 5.0–8.0)

## 2019-12-03 MED ORDER — FLUCONAZOLE 150 MG PO TABS: ORAL_TABLET | ORAL | 0 refills | Status: DC

## 2019-12-03 MED ORDER — CIPROFLOXACIN HCL 500 MG PO TABS: 500.0000 mg | ORAL_TABLET | Freq: Two times a day (BID) | ORAL | 0 refills | Status: DC

## 2019-12-03 NOTE — ED Triage Notes (Signed)
C/O dysuria, urinary urgency, polyuria, low abd pressure, slight low back pain.  States completed abx course for UTI approx 2 wks ago; had improvement, then sxs started to return over past week.  Unsure if fevers.

## 2019-12-05 LAB — URINE CULTURE: Culture: >=100000 — AB

## 2019-12-05 NOTE — ED Provider Notes (Signed)
[place]    ASSESSMENT & PLAN:  1. Cystitis     Second UTI within the past couple of weeks.  To begin: Meds ordered this encounter  Medications  . ciprofloxacin (CIPRO) 500 MG tablet    Sig: Take 1 tablet (500 mg total) by mouth every 12 (twelve) hours.    Dispense:  14 tablet    Refill:  0  . fluconazole (DIFLUCAN) 150 MG tablet    Sig: Take one tablet by mouth as a single dose. May repeat in 3 days if symptoms persist.    Dispense:  2 tablet    Refill:  0    No signs of pyelonephritis. Discussed. Urine culture sent. Will notify patient with any significant results. Will follow up with her PCP or here if not showing improvement over the next 48 hours, sooner if needed.  Outlined signs and symptoms indicating need for more acute intervention. Patient verbalized understanding. After Visit Summary given.  SUBJECTIVE: Seen by me on 11/19/2019; same concerns.  Donna Ware is a 68 y.o. female who complains of urinary frequency, urgency and dysuria for the past several days. Without associated flank pain, fever, chills, vaginal discharge or bleeding. Gross hematuria: not present. No specific aggravating or alleviating factors reported. No LE edema. Normal PO intake without n/v/d. Without specific abdominal pain. Ambulatory without difficulty. OTC treatment: none reported. Finished previous Rx Keflex. Felt symptoms "kind of improved".   LMP: Patient's last menstrual period was 12/30/1999.  ROS: As in HPI. All other systems negative.   OBJECTIVE:  Vitals:   12/03/19 1741  BP: (!) 158/73  Pulse: (!) 107  Resp: 18  Temp: 98.8 F (37.1 C)  TempSrc: Oral  SpO2: 100%   General appearance: alert; no distress HENT: oropharynx: moist CV: slight tachycardia; regular Lungs: unlabored respirations Abdomen: soft, non-tender; bowel sounds normal; no masses or organomegaly; no guarding or rebound tenderness Back: no CVA tenderness Extremities: no edema;  symmetrical with no gross deformities Skin: warm and dry Neurologic: normal gait Psychological: alert and cooperative; normal mood and affect  Labs Reviewed  POCT URINALYSIS DIP (DEVICE) - Abnormal; Notable for the following components:   Glucose, UA 100 (*)    Bilirubin Urine SMALL (*)    Ketones, ur TRACE (*)    Hgb urine dipstick SMALL (*)    Protein, ur 100 (*)    Urobilinogen, UA 2.0 (*)    Nitrite POSITIVE (*)    Leukocytes,Ua LARGE (*)    All other components within normal limits    Allergies  Allergen Reactions  . Adhesive [Tape]   . Lactose Intolerance (Gi)   . Sulfa Antibiotics     Past Medical History:  Diagnosis Date  . Anxiety state, unspecified   . Barrett's esophagus   . Cystocele, midline   . Depressive disorder, not elsewhere classified   . Encounter for long-term (current) use of other medications   . Glycosuria   . Insomnia, unspecified   . Obstructive sleep apnea (adult) (pediatric)   . Oral aphthae   . Other and unspecified hyperlipidemia   . Proteinuria   . Reflux esophagitis   . Rosacea   . Senile osteoporosis   . STD (sexually transmitted disease)    HSV2  . Unspecified essential hypertension   . Unspecified hypothyroidism   . Viral hepatitis A without mention of hepatic coma    Social History   Socioeconomic History  . Marital status: Legally Separated    Spouse name: Not  on file  . Number of children: Not on file  . Years of education: Not on file  . Highest education level: Not on file  Occupational History  . Not on file  Social Needs  . Financial resource strain: Not hard at all  . Food insecurity    Worry: Never true    Inability: Never true  . Transportation needs    Medical: No    Non-medical: No  Tobacco Use  . Smoking status: Never Smoker  . Smokeless tobacco: Never Used  Substance and Sexual Activity  . Alcohol use: Yes    Comment: couple drinks a month  . Drug use: No  . Sexual activity: Not on file   Lifestyle  . Physical activity    Days per week: 4 days    Minutes per session: 60 min  . Stress: Only a little  Relationships  . Social Herbalist on phone: Twice a week    Gets together: Once a week    Attends religious service: Never    Active member of club or organization: No    Attends meetings of clubs or organizations: Never    Relationship status: Separated  . Intimate partner violence    Fear of current or ex partner: No    Emotionally abused: No    Physically abused: No    Forced sexual activity: No  Other Topics Concern  . Not on file  Social History Narrative  . Not on file   Family History  Problem Relation Age of Onset  . Heart attack Father   . Heart attack Paternal Grandfather   . Heart disease Brother   . Breast cancer Mother        Vanessa Kick, MD 12/05/19 [phone]

## 2020-02-26 ENCOUNTER — Other Ambulatory Visit: Payer: Self-pay | Admitting: Internal Medicine

## 2020-02-27 ENCOUNTER — Other Ambulatory Visit: Payer: Self-pay | Admitting: Internal Medicine

## 2020-02-27 NOTE — Telephone Encounter (Signed)
Couldn't sign this--something must be wrong with e-prescribe b/c it happened with the last one I did also.

## 2020-02-27 NOTE — Telephone Encounter (Signed)
rx sent to pharmacy by e-script  

## 2020-03-14 ENCOUNTER — Ambulatory Visit: Payer: Medicare Other | Admitting: Nurse Practitioner

## 2020-03-20 ENCOUNTER — Encounter: Payer: Self-pay | Admitting: Certified Nurse Midwife

## 2020-03-22 ENCOUNTER — Other Ambulatory Visit: Payer: Self-pay

## 2020-03-22 ENCOUNTER — Encounter: Payer: Self-pay | Admitting: Internal Medicine

## 2020-03-22 ENCOUNTER — Ambulatory Visit (INDEPENDENT_AMBULATORY_CARE_PROVIDER_SITE_OTHER): Payer: Medicare Other | Admitting: Internal Medicine

## 2020-03-22 VITALS — BP 122/82 | HR 73 | Temp 97.7°F | Ht 65.0 in | Wt 136.6 lb

## 2020-03-22 DIAGNOSIS — M810 Age-related osteoporosis without current pathological fracture: Secondary | ICD-10-CM

## 2020-03-22 DIAGNOSIS — R739 Hyperglycemia, unspecified: Secondary | ICD-10-CM | POA: Diagnosis not present

## 2020-03-22 DIAGNOSIS — I10 Essential (primary) hypertension: Secondary | ICD-10-CM | POA: Diagnosis not present

## 2020-03-22 DIAGNOSIS — E039 Hypothyroidism, unspecified: Secondary | ICD-10-CM | POA: Diagnosis not present

## 2020-03-22 DIAGNOSIS — E785 Hyperlipidemia, unspecified: Secondary | ICD-10-CM | POA: Diagnosis not present

## 2020-03-22 DIAGNOSIS — G4700 Insomnia, unspecified: Secondary | ICD-10-CM | POA: Diagnosis not present

## 2020-03-22 MED ORDER — DENOSUMAB 60 MG/ML ~~LOC~~ SOSY: 60.0000 mg | PREFILLED_SYRINGE | Freq: Once | SUBCUTANEOUS | 1 refills | Status: AC

## 2020-03-22 MED ORDER — DAYVIGO 5 MG PO TABS: 5.0000 mg | ORAL_TABLET | Freq: Every day | ORAL | 0 refills | Status: DC

## 2020-03-22 MED ORDER — DENOSUMAB 60 MG/ML ~~LOC~~ SOSY
60.0000 mg | PREFILLED_SYRINGE | Freq: Once | SUBCUTANEOUS | Status: AC
  Administered 2020-03-22: 60 mg via SUBCUTANEOUS

## 2020-03-22 NOTE — Progress Notes (Signed)
Location:  [facility] clinic Provider:  Drayden Lukas L. Mariea Clonts, D.O., C.M.D.  Goals of Care:  Advanced Directives 03/22/2020  Does Patient Have a Medical Advance Directive? Yes  Type of Paramedic of [place];Living will  Does patient want to make changes to medical advance directive? No - Guardian declined  Copy of [place] in Chart? Yes - validated most recent copy scanned in chart (See row information)   Chief Complaint  Patient presents with  . Medical Management of Chronic Issues    6 month follow up. Prolia shot and  questions about diabetes and dementia     HPI: Patient is a 69 y.o. female seen today for medical management of chronic diseases.    Donna Ware had her two covid vaccines 2/11 and 3/8 pfizer at [employer].  Just had sore arm for a day.    Pre-covid, they went to [place].  Donna Ware had a UA and had a UTI.  Her sugar was a little high there.  Donna Ware was reading that alzheimer's is the 3rd diabetes and her mom had alzheimer's and dad's side had diabetes, mom had med-induced diabetes.  Donna Ware exercises, eats low carb, high protein diet.  hba1c in sept was 4.9.   Donna Ware's been reading to stay away from anticholinergics.  Had been taking benadryl so Donna Ware switched off of it.  3mg  melatonin 30 mins before bed helps but needs something to turn off her brain.  ambien had quit working.  buspar didn't do anything.  If Donna Ware wakes up, Donna Ware takes a klonopin.  During the night Donna Ware wakes up 3 or more times to pee and that's when Donna Ware needs the klonopin.  Does use 2-3 klonopin in the daytime, too.    They eat late, has a soda at 9-10pm and then another sprite with stevia before bed (the little bottle). Then goes to be 12 to 3am.    Says Donna Ware has not been out as much but is still exercising.    Uses claritin D with tree palm and mold allergies only.  Reviewed UTI prevention and Donna Ware's doing what Donna Ware should.    Past Medical History:  Diagnosis Date  . Anxiety state, unspecified    . Barrett's esophagus   . Cystocele, midline   . Depressive disorder, not elsewhere classified   . Encounter for long-term (current) use of other medications   . Glycosuria   . Insomnia, unspecified   . Obstructive sleep apnea (adult) (pediatric)   . Oral aphthae   . Other and unspecified hyperlipidemia   . Proteinuria   . Reflux esophagitis   . Rosacea   . Senile osteoporosis   . STD (sexually transmitted disease)    HSV2  . Unspecified essential hypertension   . Unspecified hypothyroidism   . Viral hepatitis A without mention of hepatic coma     Past Surgical History:  Procedure Laterality Date  . BREAST ENHANCEMENT SURGERY  2010  . COSMETIC SURGERY  2009   for protruding ears  . GANGLION CYST EXCISION  2011   right palm  . teeth implants    . tooth implant  [id number]  . tummy tuck      Allergies  Allergen Reactions  . Adhesive [Tape]   . Lactose Intolerance (Gi)   . Sulfa Antibiotics     Outpatient Encounter Medications as of 03/22/2020  Medication Sig  . B Complex Vitamins (B COMPLEX PO) Take by mouth. With vitamin c  . CALCIUM PO Take by  mouth.  . Cholecalciferol (VITAMIN D PO) Take 2,000 Int'l Units by mouth.  . ciprofloxacin (CIPRO) 500 MG tablet Take 1 tablet (500 mg total) by mouth every 12 (twelve) hours.  . clonazePAM (KLONOPIN) 1 MG tablet TAKE 1 TABLET BY MOUTH UP TO FOUR TIMES DAILY AS NEEDED  . Collagen-Boron-Hyaluronic Acid ([employer] JOINT HEALTH TRIPLE ACTION) 10-5-3.3 MG TABS Take by mouth.  . doxylamine, Sleep, (UNISOM) 25 MG tablet Take 25 mg by mouth at bedtime as needed.  Marland Kitchen esomeprazole (NEXIUM) 20 MG capsule Take 20 mg by mouth daily at 12 noon.  Marland Kitchen estradiol (ESTRACE) 0.1 MG/GM vaginal cream PLACE 0.5(1/2) GRAM VAGINALLY 2 TIMES A WEEK AT BEDTIME  . fluconazole (DIFLUCAN) 150 MG tablet Take one tablet by mouth as a single dose. May repeat in 3 days if symptoms persist.  . Glucosamine-MSM-Hyaluronic Acd (JOINT HEALTH PO) Take by mouth.  .  Ibuprofen 200 MG CAPS Take by mouth.  . loratadine-pseudoephedrine (CLARITIN-D 12 HOUR) 5-120 MG tablet Take 1 tablet by mouth 2 (two) times daily.  Marland Kitchen MELATONIN PO Take by mouth.  . Multiple Vitamins-Minerals (MULTIVITAMIN PO) Take by mouth.  . Phenazopyridine HCl (AZO TABS PO) Take by mouth.  . Probiotic Product (PROBIOTIC DAILY PO) Take by mouth daily.  Marland Kitchen thyroid (ARMOUR THYROID) 60 MG tablet TAKE 1 TABLET BY MOUTH EVERY DAY FOR THYROID   No facility-administered encounter medications on file as of 03/22/2020.    Review of Systems:  Review of Systems  Constitutional: Negative for chills and fever.  HENT: Negative for congestion.   Eyes: Negative for blurred vision.  Respiratory: Negative for cough and shortness of breath.   Cardiovascular: Negative for chest pain, palpitations and leg swelling.  Gastrointestinal: Negative for abdominal pain, blood in stool, constipation and melena.  Genitourinary: Negative for dysuria.  Musculoskeletal: Negative for falls.  Skin: Negative for itching and rash.  Neurological: Negative for dizziness and loss of consciousness.  Endo/Heme/Allergies: Does not bruise/bleed easily.  Psychiatric/Behavioral: Negative for depression and memory loss. The patient is nervous/anxious and has insomnia.     Health Maintenance  Topic Date Due  . MAMMOGRAM  10/05/2021  . TETANUS/TDAP  11/24/2021  . COLONOSCOPY  08/24/2024  . INFLUENZA VACCINE  Completed  . DEXA SCAN  Completed  . Hepatitis C Screening  Completed  . PNA vac Low Risk Adult  Completed    Physical Exam: Vitals:   03/22/20 1510  BP: 122/82  Pulse: 73  Temp: 97.7 F (36.5 C)  TempSrc: Temporal  SpO2: 99%  Weight: 136 lb 9.6 oz (62 kg)  Height: 5\' 5"  (1.651 m)   Body mass index is 22.73 kg/m. Physical Exam Vitals reviewed.  Constitutional:      Appearance: Normal appearance.  Cardiovascular:     Rate and Rhythm: Normal rate and regular rhythm.     Pulses: Normal pulses.     Heart  sounds: Normal heart sounds.  Pulmonary:     Effort: Pulmonary effort is normal.     Breath sounds: Normal breath sounds.  Abdominal:     General: Bowel sounds are normal.  Musculoskeletal:        General: Normal range of motion.     Cervical back: Neck supple.  Skin:    General: Skin is warm and dry.     Capillary Refill: Capillary refill takes less than 2 seconds.  Neurological:     General: No focal deficit present.     Mental Status: Donna Ware is alert and oriented to  person, place, and time.     Cranial Nerves: No cranial nerve deficit.     Motor: No weakness.     Gait: Gait normal.  Psychiatric:        Mood and Affect: Mood normal.        Behavior: Behavior normal.        Thought Content: Thought content normal.        Judgment: Judgment normal.     Labs reviewed: Basic Metabolic Panel: Recent Labs    09/12/19 0330  NA 140  K 4.2  CL 105  CO2 28  GLUCOSE 98  BUN 10  CREATININE 0.71  CALCIUM 9.8  TSH 2.09   Liver Function Tests: Recent Labs    09/12/19 0330  AST 16  ALT 11  BILITOT 0.5  PROT 7.0   No results for input(s): LIPASE, AMYLASE in the last 8760 hours. No results for input(s): AMMONIA in the last 8760 hours. CBC: Recent Labs    09/12/19 0330  WBC 4.7  NEUTROABS 3,779  HGB 13.9  HCT 40.4  MCV 96.7  PLT 236   Lipid Panel: Recent Labs    09/12/19 0330  CHOL 205*  HDL 71  LDLCALC 117*  TRIG 76  CHOLHDL 2.9   Lab Results  Component Value Date   HGBA1C 4.9 09/12/2019    Procedures since last visit: No results found.  Assessment/Plan 1. Senile osteoporosis -cont weightbearing exercise, healthy diet and calcium with vitamin D and prolia -given her prolia today: denosumab (PROLIA) 60 MG/ML SOSY injection; Inject 60 mg into the skin once for 1 dose.  Dispense: 1 mL; Refill: 1 - denosumab (PROLIA) injection 60 mg -will get her bone density with her next mammogram  2. Insomnia, unspecified type - avoid otc sleep meds -I was ok with  melatonin but it was not working for her -try  Lemborexant (DAYVIGO) 5 MG TABS; Take 5 mg by mouth at bedtime.  Dispense: 10 tablet; Refill: 0--sample given -Donna Ware will call if effective to get prescription  3. Hyperlipidemia, unspecified hyperlipidemia type -f/u flp before next visit  4. Acquired hypothyroidism -f/u tsh before next visit, cont armour thyroid  5. Hyperglycemia -hba1c normal when checked last sept, recheck before next visit  6. Essential hypertension -bp well-controlled   Labs/tests ordered:   Lab Orders     CBC with Differential/Platelet     COMPLETE METABOLIC PANEL WITH GFR     Hemoglobin A1c     Lipid panel     TSH  Next appt:  6 mos for med mgt, prolia and fasting labs before  Keep AWV with NP around that time, as well  Anjelina Dung L. Omarie Parcell, D.O. [facility] [address], [place] [address] Cell Phone (Mon-Fri 8am-5pm):  [phone] On Call:  [phone] & follow prompts after 5pm & weekends Office Phone:  [phone] Office Fax:  [phone]

## 2020-03-22 NOTE — Patient Instructions (Addendum)
Try to cut out your second sprite so you can rest better through the night.    Avoid benadryl and other otc sleeping meds other than melatonin.  Try dayvigo 5mg  nightly.  Let me know how it's working after a week.  It also has other doses.  It may take 3-4 nights to be able to be sure if it works

## 2020-03-28 ENCOUNTER — Other Ambulatory Visit: Payer: Self-pay | Admitting: Internal Medicine

## 2020-03-28 NOTE — Telephone Encounter (Signed)
Last filled 02/28/2020

## 2020-04-02 ENCOUNTER — Encounter: Payer: Self-pay | Admitting: Internal Medicine

## 2020-04-03 NOTE — Telephone Encounter (Signed)
Message routed to Reed, Tiffany L, DO  

## 2020-04-18 DIAGNOSIS — H2513 Age-related nuclear cataract, bilateral: Secondary | ICD-10-CM | POA: Diagnosis not present

## 2020-04-25 ENCOUNTER — Other Ambulatory Visit: Payer: Self-pay | Admitting: Internal Medicine

## 2020-04-26 ENCOUNTER — Other Ambulatory Visit: Payer: Self-pay | Admitting: *Deleted

## 2020-04-26 MED ORDER — CLONAZEPAM 1 MG PO TABS: ORAL_TABLET | ORAL | 0 refills | Status: DC

## 2020-04-26 NOTE — Telephone Encounter (Signed)
Patient requested refill.  [place] Verified LR: 03/28/2020 Pended Rx and sent to Dr. Mariea Clonts for approval.

## 2020-04-26 NOTE — Telephone Encounter (Signed)
Refill to soon

## 2020-05-29 ENCOUNTER — Other Ambulatory Visit: Payer: Self-pay | Admitting: Internal Medicine

## 2020-07-06 ENCOUNTER — Ambulatory Visit (HOSPITAL_COMMUNITY)
Admission: EM | Admit: 2020-07-06 | Discharge: 2020-07-06 | Disposition: A | Discharge status: Home / Self Care | Payer: Medicare Other | Attending: Family Medicine | Admitting: Family Medicine

## 2020-07-06 ENCOUNTER — Encounter (HOSPITAL_COMMUNITY): Payer: Self-pay

## 2020-07-06 ENCOUNTER — Other Ambulatory Visit: Payer: Self-pay

## 2020-07-06 DIAGNOSIS — R350 Frequency of micturition: Secondary | ICD-10-CM

## 2020-07-06 DIAGNOSIS — N3000 Acute cystitis without hematuria: Secondary | ICD-10-CM | POA: Diagnosis not present

## 2020-07-06 LAB — POCT URINALYSIS DIP (DEVICE)
Bilirubin Urine: NEGATIVE
Glucose, UA: NEGATIVE mg/dL
Ketones, ur: NEGATIVE mg/dL
Nitrite: POSITIVE — AB
Protein, ur: NEGATIVE mg/dL
Specific Gravity, Urine: 1.01 (ref 1.005–1.030)
Urobilinogen, UA: 0.2 mg/dL (ref 0.0–1.0)
pH: 6 (ref 5.0–8.0)

## 2020-07-06 MED ORDER — NITROFURANTOIN MONOHYD MACRO 100 MG PO CAPS: 100.0000 mg | ORAL_CAPSULE | Freq: Two times a day (BID) | ORAL | 0 refills | Status: DC

## 2020-07-06 NOTE — ED Provider Notes (Signed)
[facility]    CSN: [id number] Arrival date & time: 07/06/20  1940      History   Chief Complaint Chief Complaint  Patient presents with   Urinary Frequency    HPI Donna Ware is a 69 y.o. female.   HPI  Patient states that she has urinary frequency and discomfort in her "urethra" for the last 2 to 3 days.  Pain and burning with urination especially at the end of urination.  Some lower abdominal crampy pain.  No nausea or vomiting.  No fever or chills.  No flank pain.  No history of kidney problems or kidney infection  Past Medical History:  Diagnosis Date   Anxiety state, unspecified    Barrett's esophagus    Cystocele, midline    Depressive disorder, not elsewhere classified    Encounter for long-term (current) use of other medications    Glycosuria    Insomnia, unspecified    Obstructive sleep apnea (adult) (pediatric)    Oral aphthae    Other and unspecified hyperlipidemia    Proteinuria    Reflux esophagitis    Rosacea    Senile osteoporosis    STD (sexually transmitted disease)    HSV2   Unspecified essential hypertension    Unspecified hypothyroidism    Viral hepatitis A without mention of hepatic coma     Patient Active Problem List   Diagnosis Date Noted   Hyperglycemia 06/18/2017   Encounter for hepatitis C virus screening test for high risk patient 06/18/2017   Rosacea 06/18/2017   Lactose intolerance in adult 08/01/2014    Class: History of   Possible exposure to STD 06/09/2013   Annual physical exam 06/09/2013   Senile osteoporosis    Hypothyroidism    Insomnia, unspecified    Essential hypertension    Depressive disorder, not elsewhere classified    Reflux esophagitis    Barrett's esophagus    Hyperlipidemia    Obstructive sleep apnea (adult) (pediatric)     Past Surgical History:  Procedure Laterality Date   BREAST ENHANCEMENT SURGERY  2010   COSMETIC SURGERY  2009   for protruding  ears   GANGLION CYST EXCISION  2011   right palm   teeth implants     tooth implant  [id number]   tummy tuck      OB History    Gravida  0   Para      Term      Preterm      AB      Living        SAB      TAB      Ectopic      Multiple      Live Births               Home Medications    Prior to Admission medications   Medication Sig Start Date End Date Taking? Authorizing Provider  B Complex Vitamins (B COMPLEX PO) Take by mouth. With vitamin c   Yes [provider]  CALCIUM PO Take by mouth.   Yes [provider]  Cholecalciferol (VITAMIN D PO) Take 2,000 Int'l Units by mouth.   Yes [provider]  clonazePAM (KLONOPIN) 1 MG tablet TAKE 1 TABLET BY MOUTH UP TO FOUR TIMES DAILY AS NEEDED 05/29/20  Yes Reed, Tiffany L, DO  esomeprazole (NEXIUM) 20 MG capsule Take 20 mg by mouth daily at 12 noon.   Yes [provider]  estradiol (  ESTRACE) 0.1 MG/GM vaginal cream PLACE 0.5(1/2) GRAM VAGINALLY 2 TIMES A WEEK AT BEDTIME 07/25/19  Yes Regina Eck, CNM  loratadine-pseudoephedrine (CLARITIN-D 12 HOUR) 5-120 MG tablet Take 1 tablet by mouth 2 (two) times daily.   Yes [provider]  MELATONIN PO Take by mouth.   Yes [provider]  Collagen-Boron-Hyaluronic Acid ([employer] JOINT HEALTH TRIPLE ACTION) 10-5-3.3 MG TABS Take by mouth.    [provider]  Glucosamine-MSM-Hyaluronic Acd (JOINT HEALTH PO) Take by mouth.    [provider]  Ibuprofen 200 MG CAPS Take by mouth.    [provider]  Lemborexant (DAYVIGO) 5 MG TABS Take 5 mg by mouth at bedtime. 03/22/20   Reed, Tiffany L, DO  Multiple Vitamins-Minerals (MULTIVITAMIN PO) Take by mouth.    [provider]  nitrofurantoin, macrocrystal-monohydrate, (MACROBID) 100 MG capsule Take 1 capsule (100 mg total) by mouth 2 (two) times daily. 07/06/20   Raylene Everts, MD  Phenazopyridine HCl (AZO TABS PO) Take by mouth.     [provider]  Probiotic Product (PROBIOTIC DAILY PO) Take by mouth daily.    [provider]  thyroid (ARMOUR THYROID) 60 MG tablet TAKE 1 TABLET BY MOUTH EVERY DAY FOR THYROID 02/27/20   Gayland Curry, DO    Family History Family History  Problem Relation Age of Onset   Heart attack Father    Heart attack Paternal Grandfather    Heart disease Brother    Breast cancer Mother     Social History Social History   Tobacco Use   Smoking status: Never Smoker   Smokeless tobacco: Never Used  Scientific laboratory technician Use: Never used  Substance Use Topics   Alcohol use: Yes    Comment: couple drinks a month   Drug use: No     Allergies   Adhesive [tape], Lactose intolerance (gi), and Sulfa antibiotics   Review of Systems Review of Systems  See HPI Physical Exam Triage Vital Signs ED Triage Vitals  Enc Vitals Group     BP 07/06/20 2011 (!) 112/56     Pulse Rate 07/06/20 2011 81     Resp 07/06/20 2011 16     Temp 07/06/20 2011 98.9 F (37.2 C)     Temp Source 07/06/20 2011 Oral     SpO2 07/06/20 2011 99 %     Weight --      Height --      Head Circumference --      Peak Flow --      Pain Score 07/06/20 2012 2     Pain Loc --      Pain Edu? --      Excl. in [place]? --    No data found.  Updated Vital Signs BP (!) 112/56 (BP Location: Right Arm)    Pulse 81    Temp 98.9 F (37.2 C) (Oral)    Resp 16    LMP 12/30/1999    SpO2 99%      Physical Exam Constitutional:      General: She is not in acute distress.    Appearance: She is well-developed.  HENT:     Head: Normocephalic and atraumatic.     Mouth/Throat:     Comments: Mask is in place Eyes:     Conjunctiva/sclera: Conjunctivae normal.     Pupils: Pupils are equal, round, and reactive to light.  Cardiovascular:     Rate and Rhythm: Normal rate.  Pulmonary:  Effort: Pulmonary effort is normal. No respiratory distress.  Abdominal:     General: There is no distension.      Palpations: Abdomen is soft.     Tenderness: There is no right CVA tenderness or left CVA tenderness.  Musculoskeletal:        General: Normal range of motion.     Cervical back: Normal range of motion.  Skin:    General: Skin is warm and dry.  Neurological:     Mental Status: She is alert.      UC Treatments / Results  Labs (all labs ordered are listed, but only abnormal results are displayed) Labs Reviewed  POCT URINALYSIS DIP (DEVICE) - Abnormal; Notable for the following components:      Result Value   Hgb urine dipstick TRACE (*)    Nitrite POSITIVE (*)    Leukocytes,Ua LARGE (*)    All other components within normal limits  URINE CULTURE    EKG   Radiology No results found.  Procedures Procedures (including critical care time)  Medications Ordered in UC Medications - No data to display  Initial Impression / Assessment and Plan / UC Course  I have reviewed the triage vital signs and the nursing notes.  Pertinent labs & imaging results that were available during my care of the patient were reviewed by me and considered in my medical decision making (see chart for details).      Final Clinical Impressions(s) / UC Diagnoses   Final diagnoses:  Acute cystitis without hematuria     Discharge Instructions     Take the antibiotic 2 x a day Drink plenty of water You will be called if the urine culture indicates a need for change in therapy   ED Prescriptions    Medication Sig Dispense Auth. Provider   nitrofurantoin, macrocrystal-monohydrate, (MACROBID) 100 MG capsule Take 1 capsule (100 mg total) by mouth 2 (two) times daily. 10 capsule Raylene Everts, MD     PDMP not reviewed this encounter.   Raylene Everts, MD 07/06/20 2033

## 2020-07-06 NOTE — ED Triage Notes (Signed)
Pt presents to UC for possible UTI after diarrhea several days ago. Pt endorsing urinary frequency, discomfort at urethra, pain and burning with peeing, and lower abdominal pain. Pt has been treating with azo with out relief. Pt denies n/v, or fevers. Pt denies flank pain.

## 2020-07-06 NOTE — Discharge Instructions (Signed)
Take the antibiotic 2 x a day Drink plenty of water You will be called if the urine culture indicates a need for change in therapy

## 2020-07-10 LAB — URINE CULTURE
Culture: >=100000 — AB
Special Requests: NORMAL

## 2020-07-28 ENCOUNTER — Other Ambulatory Visit: Payer: Self-pay | Admitting: Internal Medicine

## 2020-07-30 NOTE — Telephone Encounter (Signed)
Refill denied called patient and left a voicemail for her to schedule a TSH test. Last one was in September 2020

## 2020-08-23 ENCOUNTER — Ambulatory Visit: Payer: Self-pay

## 2020-08-29 ENCOUNTER — Other Ambulatory Visit: Payer: Self-pay | Admitting: Internal Medicine

## 2020-09-05 ENCOUNTER — Ambulatory Visit (HOSPITAL_COMMUNITY)
Admission: EM | Admit: 2020-09-05 | Discharge: 2020-09-05 | Disposition: A | Discharge status: Home / Self Care | Payer: Medicare Other | Attending: Family Medicine | Admitting: Family Medicine

## 2020-09-05 ENCOUNTER — Encounter (HOSPITAL_COMMUNITY): Payer: Self-pay | Admitting: Emergency Medicine

## 2020-09-05 ENCOUNTER — Other Ambulatory Visit: Payer: Self-pay

## 2020-09-05 DIAGNOSIS — N3090 Cystitis, unspecified without hematuria: Secondary | ICD-10-CM | POA: Diagnosis not present

## 2020-09-05 DIAGNOSIS — R300 Dysuria: Secondary | ICD-10-CM | POA: Diagnosis not present

## 2020-09-05 LAB — POCT URINALYSIS DIPSTICK, ED / UC
Bilirubin Urine: NEGATIVE
Glucose, UA: NEGATIVE mg/dL
Ketones, ur: NEGATIVE mg/dL
Nitrite: POSITIVE — AB
Protein, ur: NEGATIVE mg/dL
Specific Gravity, Urine: 1.015 (ref 1.005–1.030)
Urobilinogen, UA: 0.2 mg/dL (ref 0.0–1.0)
pH: 7 (ref 5.0–8.0)

## 2020-09-05 MED ORDER — CEPHALEXIN 500 MG PO CAPS: 500.0000 mg | ORAL_CAPSULE | Freq: Two times a day (BID) | ORAL | 0 refills | Status: DC

## 2020-09-05 NOTE — ED Triage Notes (Signed)
Pt presents with lower abdominal pain, low back pain, dysuria, urinary frequency xs 1 week.

## 2020-09-07 LAB — URINE CULTURE: Culture: >=100000 — AB

## 2020-09-10 NOTE — ED Provider Notes (Signed)
[facility]    ASSESSMENT & PLAN:  1. Cystitis     Begiin: Meds ordered this encounter  Medications  . cephALEXin (KEFLEX) 500 MG capsule    Sig: Take 1 capsule (500 mg total) by mouth 2 (two) times daily.    Dispense:  10 capsule    Refill:  0    No signs of pyelonephritis. Discussed. Urine culture sent. Will notify patient when results available. Will follow up with her PCP or here if not showing improvement over the next 48 hours, sooner if needed.  Outlined signs and symptoms indicating need for more acute intervention. Patient verbalized understanding. After Visit Summary given.  SUBJECTIVE:  Donna Ware is a 69 y.o. female who complains of lower abdominal discomfort, dysuria, urinary frequency for the past several days. Without associated flank pain, fever, chills, vaginal discharge or bleeding. Gross hematuria: not present. No specific aggravating or alleviating factors reported. No LE edema. Normal PO intake without n/v/d. Without specific abdominal pain. Ambulatory without difficulty. OTC treatment: none reported.   LMP: Patient's last menstrual period was 12/30/1999.    OBJECTIVE:  Vitals:   09/05/20 1936  BP: 125/66  Pulse: 88  Resp: 18  Temp: 98.4 F (36.9 C)  TempSrc: Oral  SpO2: 96%   General appearance: alert; no distress HENT: oropharynx: moist Lungs: unlabored respirations Abdomen: soft, non-tender; bowel sounds normal; no masses or organomegaly; no guarding or rebound tenderness Back: no CVA tenderness Extremities: no edema; symmetrical with no gross deformities Skin: warm and dry Neurologic: normal gait Psychological: alert and cooperative; normal mood and affect  Labs Reviewed  POCT URINALYSIS DIPSTICK, ED / UC - Abnormal; Notable for the following components:   Hgb urine dipstick TRACE (*)    Nitrite POSITIVE (*)    Leukocytes,Ua SMALL (*)    All other components within normal limits    Allergies  Allergen  Reactions  . Adhesive [Tape]   . Lactose Intolerance (Gi)   . Sulfa Antibiotics     Past Medical History:  Diagnosis Date  . Anxiety state, unspecified   . Barrett's esophagus   . Cystocele, midline   . Depressive disorder, not elsewhere classified   . Encounter for long-term (current) use of other medications   . Glycosuria   . Insomnia, unspecified   . Obstructive sleep apnea (adult) (pediatric)   . Oral aphthae   . Other and unspecified hyperlipidemia   . Proteinuria   . Reflux esophagitis   . Rosacea   . Senile osteoporosis   . STD (sexually transmitted disease)    HSV2  . Unspecified essential hypertension   . Unspecified hypothyroidism   . Viral hepatitis A without mention of hepatic coma    Social History   Socioeconomic History  . Marital status: Legally Separated    Spouse name: Not on file  . Number of children: Not on file  . Years of education: Not on file  . Highest education level: Not on file  Occupational History  . Not on file  Tobacco Use  . Smoking status: Never Smoker  . Smokeless tobacco: Never Used  Vaping Use  . Vaping Use: Never used  Substance and Sexual Activity  . Alcohol use: Yes    Comment: couple drinks a month  . Drug use: No  . Sexual activity: Not on file  Other Topics Concern  . Not on file  Social History Narrative  . Not on file   Social Determinants of Health  Financial Resource Strain:   . Difficulty of Paying Living Expenses: Not on file  Food Insecurity:   . Worried About Charity fundraiser in the Last Year: Not on file  . Ran Out of Food in the Last Year: Not on file  Transportation Needs:   . Lack of Transportation (Medical): Not on file  . Lack of Transportation (Non-Medical): Not on file  Physical Activity:   . Days of Exercise per Week: Not on file  . Minutes of Exercise per Session: Not on file  Stress:   . Feeling of Stress : Not on file  Social Connections:   . Frequency of Communication with  Friends and Family: Not on file  . Frequency of Social Gatherings with Friends and Family: Not on file  . Attends Religious Services: Not on file  . Active Member of Clubs or Organizations: Not on file  . Attends Archivist Meetings: Not on file  . Marital Status: Not on file  Intimate Partner Violence:   . Fear of Current or Ex-Partner: Not on file  . Emotionally Abused: Not on file  . Physically Abused: Not on file  . Sexually Abused: Not on file   Family History  Problem Relation Age of Onset  . Heart attack Father   . Heart attack Paternal Grandfather   . Heart disease Brother   . Breast cancer Mother        Vanessa Kick, MD 09/10/20 [phone]

## 2020-09-14 ENCOUNTER — Ambulatory Visit (INDEPENDENT_AMBULATORY_CARE_PROVIDER_SITE_OTHER): Payer: Medicare Other | Admitting: Family

## 2020-09-14 ENCOUNTER — Other Ambulatory Visit: Payer: Self-pay

## 2020-09-14 ENCOUNTER — Telehealth: Payer: Self-pay

## 2020-09-14 ENCOUNTER — Encounter: Payer: Self-pay | Admitting: Family

## 2020-09-14 DIAGNOSIS — Z0000 Encounter for general adult medical examination without abnormal findings: Secondary | ICD-10-CM | POA: Diagnosis not present

## 2020-09-14 NOTE — Progress Notes (Signed)
    This service is provided via telemedicine  No vital signs collected/recorded due to the encounter was a telemedicine visit.   Location of patient (ex: home, work): Home.  Patient consents to a telephone visit: Yes.  Location of the provider (ex: office, home):  [facility].  Name of any referring provider: N/A.  Names of all persons participating in the telemedicine service and their role in the encounter: Patient, Heriberto [place], [place], [place], Webb Silversmith, NP.    Time spent on call: 14 minutes spent on the phone with Medical Assistant.

## 2020-09-14 NOTE — Telephone Encounter (Signed)
Ms. zohal, reny are scheduled for a virtual visit with your provider today.    Just as we do with appointments in the office, we must obtain your consent to participate.  Your consent will be active for this visit and any virtual visit you may have with one of our providers in the next 365 days.    If you have a MyChart account, I can also send a copy of this consent to you electronically.  All virtual visits are billed to your insurance company just like a traditional visit in the office.  As this is a virtual visit, video technology does not allow for your provider to perform a traditional examination.  This may limit your provider's ability to fully assess your condition.  If your provider identifies any concerns that need to be evaluated in person or the need to arrange testing such as labs, EKG, etc, we will make arrangements to do so.    Although advances in technology are sophisticated, we cannot ensure that it will always work on either your end or our end.  If the connection with a video visit is poor, we may have to switch to a telephone visit.  With either a video or telephone visit, we are not always able to ensure that we have a secure connection.   I need to obtain your verbal consent now.   Are you willing to proceed with your visit today?   Samarrah Tranchina Kozar has provided verbal consent on 09/14/2020 for a virtual visit (video or telephone).   [place], [place] 09/14/2020  4:27 PM

## 2020-09-14 NOTE — Progress Notes (Signed)
Subjective:   Donna Ware is a 69 y.o. female who presents for Medicare Annual (Subsequent) preventive examination.  Review of Systems     Cardiac Risk Factors include: advanced age (>18men, >102 women)     Objective:    Today's Vitals   09/14/20 1544  PainSc: 0-No pain   There is no height or weight on file to calculate BMI.  Advanced Directives 09/14/2020 03/22/2020 09/12/2019 09/12/2019 07/05/2018 05/17/2018 06/18/2017  Does Patient Have a Medical Advance Directive? Yes Yes Yes Yes Yes Yes Yes  Type of Advance Directive Living will;Healthcare Power of [place];Living will [place];Living will [place];Living will Living will;Healthcare Power of Attorney Living will;Healthcare Power of [place];Living will  Does patient want to make changes to medical advance directive? No - Patient declined No - Guardian declined No - Patient declined No - Patient declined No - Patient declined No - Patient declined -  Copy of [place] in Chart? No - copy requested Yes - validated most recent copy scanned in chart (See row information) Yes - validated most recent copy scanned in chart (See row information) Yes - validated most recent copy scanned in chart (See row information) Yes Yes Yes    Current Medications (verified) Outpatient Encounter Medications as of 09/14/2020  Medication Sig  . B Complex Vitamins (B COMPLEX PO) Take by mouth. With vitamin c  . CALCIUM PO Take by mouth.  . cephALEXin (KEFLEX) 500 MG capsule Take 1 capsule (500 mg total) by mouth 2 (two) times daily.  . Cholecalciferol (VITAMIN D PO) Take 2,000 Int'l Units by mouth.  . clonazePAM (KLONOPIN) 1 MG tablet TAKE 1 TABLET BY MOUTH UP TO FOUR TIMES DAILY AS NEEDED  . Collagen-Boron-Hyaluronic Acid ([facility] JOINT HEALTH TRIPLE ACTION) 10-5-3.3 MG TABS Take by mouth.  . esomeprazole (NEXIUM) 20 MG capsule Take 20 mg  by mouth daily at 12 noon.  Marland Kitchen estradiol (ESTRACE) 0.1 MG/GM vaginal cream PLACE 0.5(1/2) GRAM VAGINALLY 2 TIMES A WEEK AT BEDTIME  . Glucosamine-MSM-Hyaluronic Acd (JOINT HEALTH PO) Take by mouth.  . Ibuprofen 200 MG CAPS Take by mouth.  . loratadine-pseudoephedrine (CLARITIN-D 12 HOUR) 5-120 MG tablet Take 1 tablet by mouth 2 (two) times daily.  Marland Kitchen MELATONIN PO Take 5 mg by mouth.   . Multiple Vitamins-Minerals (MULTIVITAMIN PO) Take by mouth.  . Probiotic Product (PROBIOTIC DAILY PO) Take by mouth daily.  Marland Kitchen thyroid (ARMOUR THYROID) 60 MG tablet TAKE 1 TABLET BY MOUTH EVERY DAY FOR THYROID  . [DISCONTINUED] Lemborexant (DAYVIGO) 5 MG TABS Take 5 mg by mouth at bedtime.  . [DISCONTINUED] nitrofurantoin, macrocrystal-monohydrate, (MACROBID) 100 MG capsule Take 1 capsule (100 mg total) by mouth 2 (two) times daily.  . [DISCONTINUED] Phenazopyridine HCl (AZO TABS PO) Take by mouth.   No facility-administered encounter medications on file as of 09/14/2020.    Allergies (verified) Adhesive [tape], Lactose intolerance (gi), and Sulfa antibiotics   History: Past Medical History:  Diagnosis Date  . Anxiety state, unspecified   . Barrett's esophagus   . Cystocele, midline   . Depressive disorder, not elsewhere classified   . Encounter for long-term (current) use of other medications   . Glycosuria   . Insomnia, unspecified   . Obstructive sleep apnea (adult) (pediatric)   . Oral aphthae   . Other and unspecified hyperlipidemia   . Proteinuria   . Reflux esophagitis   . Rosacea   .  Senile osteoporosis   . STD (sexually transmitted disease)    HSV2  . Unspecified essential hypertension   . Unspecified hypothyroidism   . Viral hepatitis A without mention of hepatic coma    Past Surgical History:  Procedure Laterality Date  . BREAST ENHANCEMENT SURGERY  2010  . COSMETIC SURGERY  2009   for protruding ears  . GANGLION CYST EXCISION  2011   right palm  . teeth implants    . tooth  implant  [id number]  . tummy tuck     Family History  Problem Relation Age of Onset  . Heart attack Father   . Heart attack Paternal Grandfather   . Heart disease Brother   . Breast cancer Mother    Social History   Socioeconomic History  . Marital status: Legally Separated    Spouse name: Not on file  . Number of children: Not on file  . Years of education: Not on file  . Highest education level: Not on file  Occupational History  . Not on file  Tobacco Use  . Smoking status: Never Smoker  . Smokeless tobacco: Never Used  Vaping Use  . Vaping Use: Never used  Substance and Sexual Activity  . Alcohol use: Yes    Comment: couple drinks a month  . Drug use: No  . Sexual activity: Not on file  Other Topics Concern  . Not on file  Social History Narrative  . Not on file   Social Determinants of Health   Financial Resource Strain:   . Difficulty of Paying Living Expenses: Not on file  Food Insecurity:   . Worried About Charity fundraiser in the Last Year: Not on file  . Ran Out of Food in the Last Year: Not on file  Transportation Needs:   . Lack of Transportation (Medical): Not on file  . Lack of Transportation (Non-Medical): Not on file  Physical Activity:   . Days of Exercise per Week: Not on file  . Minutes of Exercise per Session: Not on file  Stress:   . Feeling of Stress : Not on file  Social Connections:   . Frequency of Communication with Friends and Family: Not on file  . Frequency of Social Gatherings with Friends and Family: Not on file  . Attends Religious Services: Not on file  . Active Member of Clubs or Organizations: Not on file  . Attends Archivist Meetings: Not on file  . Marital Status: Not on file    Tobacco Counseling Counseling given: Not Answered   Clinical Intake:  Pre-visit preparation completed: No  Pain : No/denies pain Pain Score: 0-No pain     BMI - recorded: 22.73 Nutritional Status: BMI of 19-24   Normal Nutritional Risks: None Diabetes: No  How often do you need to have someone help you when you read instructions, pamphlets, or other written materials from your doctor or pharmacy?: 1 - Never What is the last grade level you completed in school?: dropped off last 4 th year of college  Diabetic? No   Interpreter Needed?: No  Information entered by :: Emil Weigold FNP-C   Activities of Daily Living In your present state of health, do you have any difficulty performing the following activities: 09/14/2020  Hearing? N  Vision? N  Difficulty concentrating or making decisions? N  Walking or climbing stairs? N  Dressing or bathing? N  Doing errands, shopping? N  Preparing Food and eating ? N  Using  the Toilet? N  In the past six months, have you accidently leaked urine? N  Do you have problems with loss of bowel control? N  Managing your Medications? N  Managing your Finances? N  Housekeeping or managing your Housekeeping? N  Some recent data might be hidden    Patient Care Team: Gayland Curry, DO as PCP - General (Geriatric Medicine) [place], Heather, OD (Optometry)  Indicate any recent Medical Services you may have received from other than [facility] providers in the past year (date may be approximate).     Assessment:   This is a routine wellness examination for Vicke.  Hearing/Vision screen  Hearing Screening   125Hz  250Hz  500Hz  1000Hz  2000Hz  3000Hz  4000Hz  6000Hz  8000Hz   Right ear:           Left ear:           Comments: No Hearing Concerns.   Vision Screening Comments: No Vision Concerns. Patient got prescription glasses in the beginning of the year.  Dietary issues and exercise activities discussed: Current Exercise Habits: Home exercise routine, Type of exercise: treadmill;walking;strength training/weights;stretching, Time (Minutes): 60, Frequency (Times/Week): 4, Weekly Exercise (Minutes/Week): 240, Intensity: Moderate, Exercise limited by: None  identified  Goals    . Gain weight     Keep my weight down around 130 lbs or less     . 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 0 0 0 0 No  Number falls in past yr: 0 0 0 0 -  Injury with Fall? 0 0 0 0 -    Any stairs in or around the home? Yes  If so, are there any without handrails? Yes  Home free of loose throw rugs in walkways, pet beds, electrical cords, etc? No  Adequate lighting in your home to reduce risk of falls? Yes   ASSISTIVE DEVICES UTILIZED TO PREVENT FALLS:  Life alert? No  Use of a cane, walker or w/c? No  Grab bars in the bathroom? Yes  Shower chair or bench in shower? Yes  Elevated toilet seat or a handicapped toilet? No   TIMED UP AND GO:  Was the test performed? No .  Length of time to ambulate 10 feet: N/A  sec.   Gait steady and fast without use of assistive device  Cognitive Function: MMSE - Mini Mental State Exam 09/12/2019 07/05/2018 06/18/2017  Orientation to time 5 5 5   Orientation to Place 5 5 5   Registration 3 3 3   Attention/ Calculation 5 5 5   Recall 3 3 3   Language- name 2 objects 2 2 2   Language- repeat 1 1 1   Language- follow 3 step command 3 3 3   Language- read & follow direction 1 1 1   Write a sentence 1 1 1   Copy design 1 1 1   Total score 30 30 30      6CIT Screen 09/14/2020  What Year? 0 points  What month? 0 points  What time? 0 points  Count back from 20 2 points  Months in reverse 0 points  Repeat phrase 0 points  Total Score 2    Immunizations Immunization History  Administered Date(s) Administered  .  Fluad Quad(high Dose 65+) 09/12/2019  . Influenza, High Dose Seasonal PF 09/15/2017, 09/20/2018  . Influenza,inj,Quad PF,6+ Mos 09/14/2015,  08/28/2016  . Influenza-Unspecified 09/21/2012  . PFIZER SARS-COV-2 Vaccination 02/09/2020, 03/05/2020  . Pneumococcal Conjugate-13 06/18/2017  . Pneumococcal Polysaccharide-23 12/29/1994, 07/05/2018  . Tdap 12/29/1994, 11/25/2011  . Zoster 03/15/2008  . Zoster Recombinat (Shingrix) 09/09/2017, 12/17/2017    TDAP status: Up to date Flu Vaccine status: Up to date Pneumococcal vaccine status: Up to date Covid-19 vaccine status: Completed vaccines  Qualifies for Shingles Vaccine? No   Zostavax completed Yes   Shingrix Completed?: Yes  Screening Tests Health Maintenance  Topic Date Due  . INFLUENZA VACCINE  07/29/2020  . MAMMOGRAM  10/05/2021  . TETANUS/TDAP  11/24/2021  . COLONOSCOPY  08/24/2024  . DEXA SCAN  Completed  . COVID-19 Vaccine  Completed  . Hepatitis C Screening  Completed  . PNA vac Low Risk Adult  Completed    Health Maintenance  Health Maintenance Due  Topic Date Due  . INFLUENZA VACCINE  07/29/2020    Colorectal cancer screening: Completed 08/28/2024. Repeat every 10 years Mammogram status: Completed 10/06/2019 . Repeat every year Bone Density status: Completed 08/23/2018 . Results reflect: Bone density results: OSTEOPOROSIS. Repeat every 2 years.  Lung Cancer Screening: (Low Dose CT Chest recommended if Age 13-80 years, 30 pack-year currently smoking OR have quit w/in 15years.) does not qualify.   Lung Cancer Screening Referral: No   Additional Screening:  Hepatitis C Screening: does not qualify; Completed Yes   Vision Screening: Recommended annual ophthalmology exams for early detection of glaucoma and other disorders of the eye. Is the patient up to date with their annual eye exam?  Yes  Who is the provider or what is the name of the office in which the patient attends annual eye exams? Dr.JOnes  If pt is not established with a provider, would they like to be referred to a provider to establish care? No .   Dental Screening: Recommended annual  dental exams for proper oral hygiene  Community Resource Referral / Chronic Care Management: CRR required this visit?  No   CCM required this visit?  No      Plan:     I have personally reviewed and noted the following in the patient's chart:   . Medical and social history . Use of alcohol, tobacco or illicit drugs  . Current medications and supplements . Functional ability and status . Nutritional status . Physical activity . Advanced directives . List of other physicians . Hospitalizations, surgeries, and ER visits in previous 12 months . Vitals . Screenings to include cognitive, depression, and falls . Referrals and appointments  In addition, I have reviewed and discussed with patient certain preventive protocols, quality metrics, and best practice recommendations. A written personalized care plan for preventive services as well as general preventive health recommendations were provided to patient.  Sandrea Hughs, NP   09/14/2020   Nurse Notes: Influenza vaccine will       Subjective:   Donna Ware is a 68 y.o. female who presents for Medicare Annual (Subsequent) preventive examination.  Review of Systems     Cardiac Risk Factors include: advanced age (>71men, >68 women)     Objective:    Today's Vitals   09/14/20 1544  PainSc: 0-No pain   There is no height or weight on file to calculate BMI.  Advanced Directives 09/14/2020 03/22/2020 09/12/2019 09/12/2019 07/05/2018 05/17/2018 06/18/2017  Does Patient Have a Medical Advance  Directive? Yes Yes Yes Yes Yes Yes Yes  Type of Advance Directive Living will;Healthcare Power of [place];Living will [place];Living will [place];Living will Living will;Healthcare Power of Attorney Living will;Healthcare Power of [place];Living will  Does patient want to make changes to medical advance directive? No - Patient declined No  - Guardian declined No - Patient declined No - Patient declined No - Patient declined No - Patient declined -  Copy of [place] in Chart? No - copy requested Yes - validated most recent copy scanned in chart (See row information) Yes - validated most recent copy scanned in chart (See row information) Yes - validated most recent copy scanned in chart (See row information) Yes Yes Yes    Current Medications (verified) Outpatient Encounter Medications as of 09/14/2020  Medication Sig  . B Complex Vitamins (B COMPLEX PO) Take by mouth. With vitamin c  . CALCIUM PO Take by mouth.  . cephALEXin (KEFLEX) 500 MG capsule Take 1 capsule (500 mg total) by mouth 2 (two) times daily.  . Cholecalciferol (VITAMIN D PO) Take 2,000 Int'l Units by mouth.  . clonazePAM (KLONOPIN) 1 MG tablet TAKE 1 TABLET BY MOUTH UP TO FOUR TIMES DAILY AS NEEDED  . Collagen-Boron-Hyaluronic Acid ([employer] JOINT HEALTH TRIPLE ACTION) 10-5-3.3 MG TABS Take by mouth.  . esomeprazole (NEXIUM) 20 MG capsule Take 20 mg by mouth daily at 12 noon.  Marland Kitchen estradiol (ESTRACE) 0.1 MG/GM vaginal cream PLACE 0.5(1/2) GRAM VAGINALLY 2 TIMES A WEEK AT BEDTIME  . Glucosamine-MSM-Hyaluronic Acd (JOINT HEALTH PO) Take by mouth.  . Ibuprofen 200 MG CAPS Take by mouth.  . loratadine-pseudoephedrine (CLARITIN-D 12 HOUR) 5-120 MG tablet Take 1 tablet by mouth 2 (two) times daily.  Marland Kitchen MELATONIN PO Take 5 mg by mouth.   . Multiple Vitamins-Minerals (MULTIVITAMIN PO) Take by mouth.  . Probiotic Product (PROBIOTIC DAILY PO) Take by mouth daily.  Marland Kitchen thyroid (ARMOUR THYROID) 60 MG tablet TAKE 1 TABLET BY MOUTH EVERY DAY FOR THYROID  . [DISCONTINUED] Lemborexant (DAYVIGO) 5 MG TABS Take 5 mg by mouth at bedtime.  . [DISCONTINUED] nitrofurantoin, macrocrystal-monohydrate, (MACROBID) 100 MG capsule Take 1 capsule (100 mg total) by mouth 2 (two) times daily.  . [DISCONTINUED] Phenazopyridine HCl (AZO TABS PO) Take by mouth.   No  facility-administered encounter medications on file as of 09/14/2020.    Allergies (verified) Adhesive [tape], Lactose intolerance (gi), and Sulfa antibiotics   History: Past Medical History:  Diagnosis Date  . Anxiety state, unspecified   . Barrett's esophagus   . Cystocele, midline   . Depressive disorder, not elsewhere classified   . Encounter for long-term (current) use of other medications   . Glycosuria   . Insomnia, unspecified   . Obstructive sleep apnea (adult) (pediatric)   . Oral aphthae   . Other and unspecified hyperlipidemia   . Proteinuria   . Reflux esophagitis   . Rosacea   . Senile osteoporosis   . STD (sexually transmitted disease)    HSV2  . Unspecified essential hypertension   . Unspecified hypothyroidism   . Viral hepatitis A without mention of hepatic coma    Past Surgical History:  Procedure Laterality Date  . BREAST ENHANCEMENT SURGERY  2010  . COSMETIC SURGERY  2009   for protruding ears  . GANGLION CYST EXCISION  2011   right palm  . teeth implants    . tooth implant  [id number]  . tummy tuck     Family History  Problem Relation Age of Onset  . Heart attack Father   . Heart attack Paternal Grandfather   . Heart disease Brother   . Breast cancer Mother    Social History   Socioeconomic History  . Marital status: Legally Separated    Spouse name: Not on file  . Number of children: Not on file  . Years of education: Not on file  . Highest education level: Not on file  Occupational History  . Not on file  Tobacco Use  . Smoking status: Never Smoker  . Smokeless tobacco: Never Used  Vaping Use  . Vaping Use: Never used  Substance and Sexual Activity  . Alcohol use: Yes    Comment: couple drinks a month  . Drug use: No  . Sexual activity: Not on file  Other Topics Concern  . Not on file  Social History Narrative  . Not on file   Social Determinants of Health   Financial Resource Strain:   . Difficulty of Paying Living  Expenses: Not on file  Food Insecurity:   . Worried About Charity fundraiser in the Last Year: Not on file  . Ran Out of Food in the Last Year: Not on file  Transportation Needs:   . Lack of Transportation (Medical): Not on file  . Lack of Transportation (Non-Medical): Not on file  Physical Activity:   . Days of Exercise per Week: Not on file  . Minutes of Exercise per Session: Not on file  Stress:   . Feeling of Stress : Not on file  Social Connections:   . Frequency of Communication with Friends and Family: Not on file  . Frequency of Social Gatherings with Friends and Family: Not on file  . Attends Religious Services: Not on file  . Active Member of Clubs or Organizations: Not on file  . Attends Archivist Meetings: Not on file  . Marital Status: Not on file    Tobacco Counseling Counseling given: Not Answered   Clinical Intake:  Pre-visit preparation completed: No  Pain : No/denies pain Pain Score: 0-No pain     BMI - recorded: 22.73 Nutritional Status: BMI of 19-24  Normal Nutritional Risks: None Diabetes: No  How often do you need to have someone help you when you read instructions, pamphlets, or other written materials from your doctor or pharmacy?: 1 - Never What is the last grade level you completed in school?: dropped off last 4 th year of college  Diabetic? No   Interpreter Needed?: No  Information entered by :: Jeremey Bascom FNP-C   Activities of Daily Living In your present state of health, do you have any difficulty performing the following activities: 09/14/2020  Hearing? N  Vision? N  Difficulty concentrating or making decisions? N  Walking or climbing stairs? N  Dressing or bathing? N  Doing errands, shopping? N  Preparing Food and eating ? N  Using the Toilet? N  In the past six months, have you accidently leaked urine? N  Do you have problems with loss of bowel control? N  Managing your Medications? N  Managing your Finances?  N  Housekeeping or managing your Housekeeping? N  Some recent data might be hidden    Patient Care Team: Gayland Curry, DO as PCP - General (Geriatric Medicine) [place], Heather, [place] (Optometry)  Indicate any recent Medical Services you may have received from other than [facility]  providers in the past year (date may be approximate).     Assessment:   This is a routine wellness examination for Arlana.  Hearing/Vision screen  Hearing Screening   125Hz  250Hz  500Hz  1000Hz  2000Hz  3000Hz  4000Hz  6000Hz  8000Hz   Right ear:           Left ear:           Comments: No Hearing Concerns.   Vision Screening Comments: No Vision Concerns. Patient got prescription glasses in the beginning of the year.  Dietary issues and exercise activities discussed: Current Exercise Habits: Home exercise routine, Type of exercise: treadmill;walking;strength training/weights;stretching, Time (Minutes): 60, Frequency (Times/Week): 4, Weekly Exercise (Minutes/Week): 240, Intensity: Moderate, Exercise limited by: None identified  Goals    . Gain weight     Keep my weight down around 130 lbs or less     . 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 0 0 0 0 No  Number falls in past yr: 0 0 0 0 -  Injury with Fall? 0 0 0 0 -    Any stairs in or around the home? Yes  If so, are there any without handrails? Yes  Home free of loose throw rugs in walkways, pet beds, electrical cords, etc? No  Adequate lighting in your home to reduce risk of falls? Yes   ASSISTIVE DEVICES UTILIZED TO PREVENT FALLS:  Life alert? No  Use of a cane, walker or w/c? No  Grab bars in the bathroom? Yes  Shower chair or bench in shower? Yes  Elevated toilet seat or a  handicapped toilet? No   TIMED UP AND GO:  Was the test performed? No .  Length of time to ambulate 10 feet:N/A sec.   Gait steady and fast without use of assistive device  Cognitive Function: MMSE - Mini Mental State Exam 09/12/2019 07/05/2018 06/18/2017  Orientation to time 5 5 5   Orientation to Place 5 5 5   Registration 3 3 3   Attention/ Calculation 5 5 5   Recall 3 3 3   Language- name 2 objects 2 2 2   Language- repeat 1 1 1   Language- follow 3 step command 3 3 3   Language- read & follow direction 1 1 1   Write a sentence 1 1 1   Copy design 1 1 1   Total score 30 30 30      6CIT Screen 09/14/2020  What Year? 0 points  What month? 0 points  What time? 0 points  Count back from 20 2 points  Months in reverse 0 points  Repeat phrase 0 points  Total Score 2    Immunizations Immunization History  Administered Date(s) Administered  . Fluad Quad(high Dose 65+) 09/12/2019  . Influenza, High Dose Seasonal PF 09/15/2017, 09/20/2018  . Influenza,inj,Quad PF,6+ Mos 09/14/2015, 08/28/2016  . Influenza-Unspecified 09/21/2012  . PFIZER SARS-COV-2 Vaccination 02/09/2020, 03/05/2020  . Pneumococcal Conjugate-13 06/18/2017  . Pneumococcal Polysaccharide-23 12/29/1994, 07/05/2018  . Tdap 12/29/1994, 11/25/2011  . Zoster 03/15/2008  . Zoster Recombinat (Shingrix) 09/09/2017, 12/17/2017    TDAP status: Up to date Flu Vaccine status: Up to date Pneumococcal vaccine status: Up to date Covid-19 vaccine status: Completed vaccines  Qualifies for Shingles Vaccine? Yes   Zostavax completed Yes   Shingrix Completed?: Yes  Screening Tests Health Maintenance  Topic Date Due  . INFLUENZA VACCINE  07/29/2020  . MAMMOGRAM  10/05/2021  . TETANUS/TDAP  11/24/2021  . COLONOSCOPY  08/24/2024  . DEXA SCAN  Completed  . COVID-19 Vaccine  Completed  . Hepatitis C Screening  Completed  . PNA vac Low Risk Adult  Completed    Health Maintenance  Health Maintenance Due  Topic Date Due  .  INFLUENZA VACCINE  07/29/2020    Colorectal cancer screening: Completed 08/28/2014 . Repeat every 10 years Mammogram status: Completed 08/23/2018. Repeat every year Bone Density status: Completed 08/23/2018 . Results reflect: Bone density results: OSTEOPOROSIS. Repeat every 2 years.  Lung Cancer Screening: (Low Dose CT Chest recommended if Age 47-80 years, 30 pack-year currently smoking OR have quit w/in 15years.) does not qualify.   Lung Cancer Screening Referral: No  Additional Screening:  Hepatitis C Screening: does not qualify; Completed yes   Vision Screening: Recommended annual ophthalmology exams for early detection of glaucoma and other disorders of the eye. Is the patient up to date with their annual eye exam?  Yes  Who is the provider or what is the name of the office in which the patient attends annual eye exams? Dr.Jones  If pt is not established with a provider, would they like to be referred to a provider to establish care? No .   Dental Screening: Recommended annual dental exams for proper oral hygiene  Community Resource Referral / Chronic Care Management: CRR required this visit?  Yes   CCM required this visit?  No      Plan:    - due for Influenza vaccine will come to the office next week for influenza vaccine   I have personally reviewed and noted the following in the patient's chart:   . Medical and social history . Use of alcohol, tobacco or illicit drugs  . Current medications and supplements . Functional ability and status . Nutritional status . Physical activity . Advanced directives . List of other physicians . Hospitalizations, surgeries, and ER visits in previous 12 months . Vitals . Screenings to include cognitive, depression, and falls . Referrals and appointments  In addition, I have reviewed and discussed with patient certain preventive protocols, quality metrics, and best practice recommendations. A written personalized care plan for  preventive services as well as general preventive health recommendations were provided to patient.    Sandrea Hughs, NP   09/14/2020   Nurse Notes:Has upcoming appointment for lab work.will get her Influenza vaccine too.

## 2020-09-14 NOTE — Patient Instructions (Signed)
Donna Ware , Thank you for taking time to come for your Medicare Wellness Visit. I appreciate your ongoing commitment to your health goals. Please review the following plan we discussed and let me know if I can assist you in the future.   Screening recommendations/referrals: Colonoscopy Up to date  Mammogram: due October,2021  Bone Density: due October,2021 Recommended yearly ophthalmology/optometry visit for glaucoma screening and checkup Recommended yearly dental visit for hygiene and checkup  Vaccinations: Influenza vaccine: please get your flu shot next week  Pneumococcal vaccine: Up to date  Tdap vaccine : Up to date Shingles vaccine : Up to date   Advanced directives: Yes   Conditions/risks identified: Advance age female > 25 yrs old   Next appointment: 1 year    Preventive Care 34 Years and Older, Female Preventive care refers to lifestyle choices and visits with your health care provider that can promote health and wellness. What does preventive care include?  A yearly physical exam. This is also called an annual well check.  Dental exams once or twice a year.  Routine eye exams. Ask your health care provider how often you should have your eyes checked.  Personal lifestyle choices, including:  Daily care of your teeth and gums.  Regular physical activity.  Eating a healthy diet.  Avoiding tobacco and drug use.  Limiting alcohol use.  Practicing safe sex.  Taking low-dose aspirin every day.  Taking vitamin and mineral supplements as recommended by your health care provider. What happens during an annual well check? The services and screenings done by your health care provider during your annual well check will depend on your age, overall health, lifestyle risk factors, and family history of disease. Counseling  Your health care provider may ask you questions about your:  Alcohol use.  Tobacco use.  Drug use.  Emotional well-being.  Home and  relationship well-being.  Sexual activity.  Eating habits.  History of falls.  Memory and ability to understand (cognition).  Work and work Statistician.  Reproductive health. Screening  You may have the following tests or measurements:  Height, weight, and BMI.  Blood pressure.  Lipid and cholesterol levels. These may be checked every 5 years, or more frequently if you are over 21 years old.  Skin check.  Lung cancer screening. You may have this screening every year starting at age 34 if you have a 30-pack-year history of smoking and currently smoke or have quit within the past 15 years.  Fecal occult blood test (FOBT) of the stool. You may have this test every year starting at age 31.  Flexible sigmoidoscopy or colonoscopy. You may have a sigmoidoscopy every 5 years or a colonoscopy every 10 years starting at age 80.  Hepatitis C blood test.  Hepatitis B blood test.  Sexually transmitted disease (STD) testing.  Diabetes screening. This is done by checking your blood sugar (glucose) after you have not eaten for a while (fasting). You may have this done every 1-3 years.  Bone density scan. This is done to screen for osteoporosis. You may have this done starting at age 69.  Mammogram. This may be done every 1-2 years. Talk to your health care provider about how often you should have regular mammograms. Talk with your health care provider about your test results, treatment options, and if necessary, the need for more tests. Vaccines  Your health care provider may recommend certain vaccines, such as:  Influenza vaccine. This is recommended every year.  Tetanus, diphtheria, and  acellular pertussis (Tdap, Td) vaccine. You may need a Td booster every 10 years.  Zoster vaccine. You may need this after age 55.  Pneumococcal 13-valent conjugate (PCV13) vaccine. One dose is recommended after age 62.  Pneumococcal polysaccharide (PPSV23) vaccine. One dose is recommended after  age 33. Talk to your health care provider about which screenings and vaccines you need and how often you need them. This information is not intended to replace advice given to you by your health care provider. Make sure you discuss any questions you have with your health care provider. Document Released: 01/11/2016 Document Revised: 09/03/2016 Document Reviewed: 10/16/2015 Elsevier Interactive Patient Education  2017 [place] Prevention in the Home Falls can cause injuries. They can happen to people of all ages. There are many things you can do to make your home safe and to help prevent falls. What can I do on the outside of my home?  Regularly fix the edges of walkways and driveways and fix any cracks.  Remove anything that might make you trip as you walk through a door, such as a raised step or threshold.  Trim any bushes or trees on the path to your home.  Use bright outdoor lighting.  Clear any walking paths of anything that might make someone trip, such as rocks or tools.  Regularly check to see if handrails are loose or broken. Make sure that both sides of any steps have handrails.  Any raised decks and porches should have guardrails on the edges.  Have any leaves, snow, or ice cleared regularly.  Use sand or salt on walking paths during winter.  Clean up any spills in your garage right away. This includes oil or grease spills. What can I do in the bathroom?  Use night lights.  Install grab bars by the toilet and in the tub and shower. Do not use towel bars as grab bars.  Use non-skid mats or decals in the tub or shower.  If you need to sit down in the shower, use a plastic, non-slip stool.  Keep the floor dry. Clean up any water that spills on the floor as soon as it happens.  Remove soap buildup in the tub or shower regularly.  Attach bath mats securely with double-sided non-slip rug tape.  Do not have throw rugs and other things on the floor that can  make you trip. What can I do in the bedroom?  Use night lights.  Make sure that you have a light by your bed that is easy to reach.  Do not use any sheets or blankets that are too big for your bed. They should not hang down onto the floor.  Have a firm chair that has side arms. You can use this for support while you get dressed.  Do not have throw rugs and other things on the floor that can make you trip. What can I do in the kitchen?  Clean up any spills right away.  Avoid walking on wet floors.  Keep items that you use a lot in easy-to-reach places.  If you need to reach something above you, use a strong step stool that has a grab bar.  Keep electrical cords out of the way.  Do not use floor polish or wax that makes floors slippery. If you must use wax, use non-skid floor wax.  Do not have throw rugs and other things on the floor that can make you trip. What can I do with my stairs?  Do not leave any items on the stairs.  Make sure that there are handrails on both sides of the stairs and use them. Fix handrails that are broken or loose. Make sure that handrails are as long as the stairways.  Check any carpeting to make sure that it is firmly attached to the stairs. Fix any carpet that is loose or worn.  Avoid having throw rugs at the top or bottom of the stairs. If you do have throw rugs, attach them to the floor with carpet tape.  Make sure that you have a light switch at the top of the stairs and the bottom of the stairs. If you do not have them, ask someone to add them for you. What else can I do to help prevent falls?  Wear shoes that:  Do not have high heels.  Have rubber bottoms.  Are comfortable and fit you well.  Are closed at the toe. Do not wear sandals.  If you use a stepladder:  Make sure that it is fully opened. Do not climb a closed stepladder.  Make sure that both sides of the stepladder are locked into place.  Ask someone to hold it for you,  if possible.  Clearly mark and make sure that you can see:  Any grab bars or handrails.  First and last steps.  Where the edge of each step is.  Use tools that help you move around (mobility aids) if they are needed. These include:  Canes.  Walkers.  Scooters.  Crutches.  Turn on the lights when you go into a dark area. Replace any light bulbs as soon as they burn out.  Set up your furniture so you have a clear path. Avoid moving your furniture around.  If any of your floors are uneven, fix them.  If there are any pets around you, be aware of where they are.  Review your medicines with your doctor. Some medicines can make you feel dizzy. This can increase your chance of falling. Ask your doctor what other things that you can do to help prevent falls. This information is not intended to replace advice given to you by your health care provider. Make sure you discuss any questions you have with your health care provider. Document Released: 10/11/2009 Document Revised: 05/22/2016 Document Reviewed: 01/19/2015 Elsevier Interactive Patient Education  2017 [employer].

## 2020-09-15 ENCOUNTER — Telehealth: Payer: Medicare Other | Admitting: Family

## 2020-09-15 DIAGNOSIS — N390 Urinary tract infection, site not specified: Secondary | ICD-10-CM | POA: Diagnosis not present

## 2020-09-15 MED ORDER — NITROFURANTOIN MONOHYD MACRO 100 MG PO CAPS: 100.0000 mg | ORAL_CAPSULE | Freq: Two times a day (BID) | ORAL | 0 refills | Status: DC

## 2020-09-15 NOTE — Addendum Note (Signed)
Addended by: Evelina Dun A on: 09/15/2020 07:47 PM   Modules accepted: Orders

## 2020-09-15 NOTE — Progress Notes (Signed)
We are sorry that you are not feeling well.  Here is how we plan to help!  Based on what you shared with me it looks like you most likely have a simple urinary tract infection.  A UTI (Urinary Tract Infection) is a bacterial infection of the bladder.  Most cases of urinary tract infections are simple to treat but a key part of your care is to encourage you to drink plenty of fluids and watch your symptoms carefully.  After reviewing your urgent care visit and your urine culture I will go ahead and treat. However given that you are having back pain and other symptoms I would suggest that within 24 hours of of taking this anabiotic and your symptoms have not improved you need to seek care face-to-face to be evaluate it for a more serious infection.  I have prescribed MacroBid 100 mg twice a day for 5 days.  Your symptoms should gradually improve. Call us if the burning in your urine worsens, you develop worsening fever, back pain or pelvic pain or if your symptoms do not resolve after completing the antibiotic.  Urinary tract infections can be prevented by drinking plenty of water to keep your body hydrated.  Also be sure when you wipe, wipe from front to back and don't hold it in!  If possible, empty your bladder every 4 hours.  Your e-visit answers were reviewed by a board certified advanced clinical practitioner to complete your personal care plan.  Depending on the condition, your plan could have included both over the counter or prescription medications.  If there is a problem please reply  once you have received a response from your provider.  Your safety is important to [place].  If you have drug allergies check your prescription carefully.    You can use MyChart to ask questions about today's visit, request a non-urgent call back, or ask for a work or school excuse for 24 hours related to this e-Visit. If it has been greater than 24 hours you will need to follow up with your provider, or enter a  new e-Visit to address those concerns.   You will get an e-mail in the next two days asking about your experience.  I hope that your e-visit has been valuable and will speed your recovery. Thank you for using e-visits.  Approximately 5 minutes was spent documenting and reviewing patient's chart.

## 2020-09-20 ENCOUNTER — Encounter: Payer: Medicare Other | Admitting: Nurse Practitioner

## 2020-09-20 ENCOUNTER — Other Ambulatory Visit: Payer: Self-pay

## 2020-09-20 ENCOUNTER — Other Ambulatory Visit: Payer: Medicare Other

## 2020-09-20 DIAGNOSIS — I10 Essential (primary) hypertension: Secondary | ICD-10-CM

## 2020-09-20 DIAGNOSIS — E039 Hypothyroidism, unspecified: Secondary | ICD-10-CM | POA: Diagnosis not present

## 2020-09-20 DIAGNOSIS — E785 Hyperlipidemia, unspecified: Secondary | ICD-10-CM | POA: Diagnosis not present

## 2020-09-20 DIAGNOSIS — R739 Hyperglycemia, unspecified: Secondary | ICD-10-CM | POA: Diagnosis not present

## 2020-09-21 ENCOUNTER — Encounter: Payer: Medicare Other | Admitting: Family

## 2020-09-21 LAB — CBC WITH DIFFERENTIAL/PLATELET
Absolute Monocytes: 429 cells/uL (ref 200–950)
Basophils Absolute: 30 cells/uL (ref 0–200)
Basophils Relative: 0.8 %
Eosinophils Absolute: 38 cells/uL (ref 15–500)
Eosinophils Relative: 1 %
HCT: 42.7 % (ref 35.0–45.0)
Hemoglobin: 14.6 g/dL (ref 11.7–15.5)
Lymphs Abs: 836 cells/uL — ABNORMAL LOW (ref 850–3900)
MCH: 34.4 pg — ABNORMAL HIGH (ref 27.0–33.0)
MCHC: 34.2 g/dL (ref 32.0–36.0)
MCV: 100.7 fL — ABNORMAL HIGH (ref 80.0–100.0)
MPV: 12.4 fL (ref 7.5–12.5)
Monocytes Relative: 11.3 %
Neutro Abs: 2466 cells/uL (ref 1500–7800)
Neutrophils Relative %: 64.9 %
Platelets: 275 10*3/uL (ref 140–400)
RBC: 4.24 10*6/uL (ref 3.80–5.10)
RDW: 10.7 % — ABNORMAL LOW (ref 11.0–15.0)
Total Lymphocyte: 22 %
WBC: 3.8 10*3/uL (ref 3.8–10.8)

## 2020-09-21 LAB — TSH: TSH: 2.49 mIU/L (ref 0.40–4.50)

## 2020-09-21 LAB — LIPID PANEL
Cholesterol: 224 mg/dL — ABNORMAL HIGH (ref <200)
HDL: 66 mg/dL (ref >=50)
LDL Cholesterol (Calc): 140 mg/dL (calc) — ABNORMAL HIGH
Non-HDL Cholesterol (Calc): 158 mg/dL (calc) — ABNORMAL HIGH (ref <130)
Total CHOL/HDL Ratio: 3.4 (calc) (ref <5.0)
Triglycerides: 84 mg/dL (ref <150)

## 2020-09-21 LAB — COMPLETE METABOLIC PANEL WITH GFR
AG Ratio: 1.8 (calc) (ref 1.0–2.5)
ALT: 10 U/L (ref 6–29)
AST: 15 U/L (ref 10–35)
Albumin: 4.6 g/dL (ref 3.6–5.1)
Alkaline phosphatase (APISO): 47 U/L (ref 37–153)
BUN: 23 mg/dL (ref 7–25)
CO2: 28 mmol/L (ref 20–32)
Calcium: 9.8 mg/dL (ref 8.6–10.4)
Chloride: 107 mmol/L (ref 98–110)
Creat: 0.73 mg/dL (ref 0.50–0.99)
GFR, Est African American: 98 mL/min/{1.73_m2} (ref >=60)
GFR, Est Non African American: 85 mL/min/{1.73_m2} (ref >=60)
Globulin: 2.5 g/dL (calc) (ref 1.9–3.7)
Glucose, Bld: 97 mg/dL (ref 65–139)
Potassium: 4.3 mmol/L (ref 3.5–5.3)
Sodium: 142 mmol/L (ref 135–146)
Total Bilirubin: 0.6 mg/dL (ref 0.2–1.2)
Total Protein: 7.1 g/dL (ref 6.1–8.1)

## 2020-09-21 LAB — HEMOGLOBIN A1C
Hgb A1c MFr Bld: 4.8 % of total Hgb (ref <5.7)
Mean Plasma Glucose: 91 (calc)
eAG (mmol/L): 5 (calc)

## 2020-09-21 NOTE — Progress Notes (Signed)
Labs are ok except cholesterol which has gone up considerably since last visit.  We'll discuss when I see her.

## 2020-09-27 ENCOUNTER — Encounter: Payer: Self-pay | Admitting: Internal Medicine

## 2020-09-27 ENCOUNTER — Ambulatory Visit (INDEPENDENT_AMBULATORY_CARE_PROVIDER_SITE_OTHER): Payer: Medicare Other | Admitting: Internal Medicine

## 2020-09-27 ENCOUNTER — Other Ambulatory Visit: Payer: Self-pay

## 2020-09-27 VITALS — BP 122/78 | HR 75 | Temp 97.1°F | Ht 65.0 in | Wt 130.0 lb

## 2020-09-27 DIAGNOSIS — M810 Age-related osteoporosis without current pathological fracture: Secondary | ICD-10-CM | POA: Diagnosis not present

## 2020-09-27 DIAGNOSIS — I10 Essential (primary) hypertension: Secondary | ICD-10-CM

## 2020-09-27 DIAGNOSIS — E039 Hypothyroidism, unspecified: Secondary | ICD-10-CM

## 2020-09-27 DIAGNOSIS — Z23 Encounter for immunization: Secondary | ICD-10-CM

## 2020-09-27 DIAGNOSIS — R739 Hyperglycemia, unspecified: Secondary | ICD-10-CM | POA: Diagnosis not present

## 2020-09-27 DIAGNOSIS — K2100 Gastro-esophageal reflux disease with esophagitis, without bleeding: Secondary | ICD-10-CM | POA: Diagnosis not present

## 2020-09-27 DIAGNOSIS — E785 Hyperlipidemia, unspecified: Secondary | ICD-10-CM | POA: Diagnosis not present

## 2020-09-27 MED ORDER — DENOSUMAB 60 MG/ML ~~LOC~~ SOSY
60.0000 mg | PREFILLED_SYRINGE | Freq: Once | SUBCUTANEOUS | Status: AC
  Administered 2020-09-27: 60 mg via SUBCUTANEOUS

## 2020-09-27 NOTE — Progress Notes (Signed)
Location:  [facility] clinic Provider:  Graycee Greeson L. Mariea Clonts, D.O., C.M.D.  Goals of Care:  Advanced Directives 09/27/2020  Does Patient Have a Medical Advance Directive? Yes  Type of Paramedic of [place];Living will;Out of facility DNR (pink MOST or yellow form)  Does patient want to make changes to medical advance directive? No - Patient declined  Copy of [place] in Chart? Yes - validated most recent copy scanned in chart (See row information)     Chief Complaint  Patient presents with  . Medical Management of Chronic Issues    6 month follow-up and discuss labs   . Injections    Prolia injection   . Immunizations    Flu vaccine today   . Medication Refill    Refill tyroid medication and clonazepam     HPI: Patient is a 69 y.o. female seen today for medical management of chronic diseases.    She eats so clean.  The night before her labs, they got takeout with wonton soup, white rice and she got steamed veggies and chicken.  She ate all of the rice.  She was also taking abx for UTI.  She fasted today in hopes we could recheck cholesterol today.  She really does not want to have to take statins.    She and her friend have been together 7 yrs.    She's still exercising on the treadmill, bike, yoga, light weights.    She stopped her esomeprazole at least since Jan.  If she has something real acidic, she uses tums.  Barrett's was resolved in 2015 w/ EGD.  Sees Dr. Earlean Shawl.  She'd had a ton of stress during that.  She's due next in 2025.    She's already gone for her covid booster to [facility].    Was upset that she missed a question on 6CIT (months backwards).    Doing fine with prolia injections--received today.  September will be bone density and mammogram.  Past Medical History:  Diagnosis Date  . Anxiety state, unspecified   . Barrett's esophagus   . Cystocele, midline   . Depressive disorder, not elsewhere classified   . Encounter  for long-term (current) use of other medications   . Glycosuria   . Insomnia, unspecified   . Obstructive sleep apnea (adult) (pediatric)   . Oral aphthae   . Other and unspecified hyperlipidemia   . Proteinuria   . Reflux esophagitis   . Rosacea   . Senile osteoporosis   . STD (sexually transmitted disease)    HSV2  . Unspecified essential hypertension   . Unspecified hypothyroidism   . Viral hepatitis A without mention of hepatic coma     Past Surgical History:  Procedure Laterality Date  . BREAST ENHANCEMENT SURGERY  2010  . COSMETIC SURGERY  2009   for protruding ears  . GANGLION CYST EXCISION  2011   right palm  . teeth implants    . tooth implant  [id number]  . tummy tuck      Allergies  Allergen Reactions  . Adhesive [Tape]   . Lactose Intolerance (Gi)   . Sulfa Antibiotics     Outpatient Encounter Medications as of 09/27/2020  Medication Sig  . B Complex Vitamins (B COMPLEX PO) Take by mouth. With vitamin c  . CALCIUM PO Take by mouth.  . cephALEXin (KEFLEX) 500 MG capsule Take 1 capsule (500 mg total) by mouth 2 (two) times daily.  . Cholecalciferol (VITAMIN D  PO) Take 2,000 Int'l Units by mouth.  . clonazePAM (KLONOPIN) 1 MG tablet TAKE 1 TABLET BY MOUTH UP TO FOUR TIMES DAILY AS NEEDED  . Collagen-Boron-Hyaluronic Acid ([facility] JOINT HEALTH TRIPLE ACTION) 10-5-3.3 MG TABS Take by mouth.  . esomeprazole (NEXIUM) 20 MG capsule Take 20 mg by mouth daily at 12 noon.  Marland Kitchen estradiol (ESTRACE) 0.1 MG/GM vaginal cream PLACE 0.5(1/2) GRAM VAGINALLY 2 TIMES A WEEK AT BEDTIME  . Glucosamine-MSM-Hyaluronic Acd (JOINT HEALTH PO) Take by mouth.  . Ibuprofen 200 MG CAPS Take by mouth as needed.   . loratadine-pseudoephedrine (CLARITIN-D 12 HOUR) 5-120 MG tablet Take 1 tablet by mouth 2 (two) times daily as needed.   Marland Kitchen MELATONIN PO Take 5 mg by mouth.   . Multiple Vitamins-Minerals (MULTIVITAMIN PO) Take by mouth.  . nitrofurantoin, macrocrystal-monohydrate, (MACROBID) 100 MG  capsule Take 1 capsule (100 mg total) by mouth 2 (two) times daily. 1 po BId  . Probiotic Product (PROBIOTIC DAILY PO) Take by mouth daily.  Marland Kitchen thyroid (ARMOUR THYROID) 60 MG tablet TAKE 1 TABLET BY MOUTH EVERY DAY FOR THYROID  . [EXPIRED] denosumab (PROLIA) injection 60 mg    No facility-administered encounter medications on file as of 09/27/2020.    Review of Systems:  Review of Systems  Constitutional: Negative for chills, fever and malaise/fatigue.  HENT: Negative for congestion and sore throat.   Eyes: Negative for blurred vision.  Respiratory: Negative for cough and shortness of breath.   Cardiovascular: Negative for chest pain, palpitations and leg swelling.  Gastrointestinal: Positive for heartburn. Negative for abdominal pain, blood in stool, constipation, diarrhea and melena.  Genitourinary: Negative for dysuria.  Musculoskeletal: Negative for joint pain.  Neurological: Negative for dizziness and loss of consciousness.  Endo/Heme/Allergies: Does not bruise/bleed easily.  Psychiatric/Behavioral: Negative for memory loss. The patient is nervous/anxious.     Health Maintenance  Topic Date Due  . MAMMOGRAM  10/05/2021  . TETANUS/TDAP  11/24/2021  . COLONOSCOPY  08/24/2024  . INFLUENZA VACCINE  Completed  . DEXA SCAN  Completed  . COVID-19 Vaccine  Completed  . Hepatitis C Screening  Completed  . PNA vac Low Risk Adult  Completed    Physical Exam: Vitals:   09/27/20 1521  BP: 122/78  Pulse: 75  Temp: (!) 97.1 F (36.2 C)  TempSrc: Temporal  SpO2: 98%  Weight: 130 lb (59 kg)  Height: 5\' 5"  (1.651 m)   Body mass index is 21.63 kg/m. Physical Exam Vitals reviewed.  Constitutional:      General: She is not in acute distress.    Appearance: Normal appearance. She is not toxic-appearing.  HENT:     Head: Normocephalic and atraumatic.  Cardiovascular:     Rate and Rhythm: Normal rate and regular rhythm.  Pulmonary:     Effort: Pulmonary effort is normal.      Breath sounds: Normal breath sounds. No wheezing, rhonchi or rales.  Abdominal:     General: Bowel sounds are normal.  Musculoskeletal:        General: Normal range of motion.     Right lower leg: No edema.     Left lower leg: No edema.  Skin:    General: Skin is warm and dry.  Neurological:     General: No focal deficit present.     Mental Status: She is alert and oriented to person, place, and time. Mental status is at baseline.  Psychiatric:     Comments: Anxious and jittery--gets to where she cannot  think of words     Labs reviewed: Basic Metabolic Panel: Recent Labs    09/20/20 1509  NA 142  K 4.3  CL 107  CO2 28  GLUCOSE 97  BUN 23  CREATININE 0.73  CALCIUM 9.8  TSH 2.49   Liver Function Tests: Recent Labs    09/20/20 1509  AST 15  ALT 10  BILITOT 0.6  PROT 7.1   No results for input(s): LIPASE, AMYLASE in the last 8760 hours. No results for input(s): AMMONIA in the last 8760 hours. CBC: Recent Labs    09/20/20 1509  WBC 3.8  NEUTROABS 2,466  HGB 14.6  HCT 42.7  MCV 100.7*  PLT 275   Lipid Panel: Recent Labs    09/20/20 1509  CHOL 224*  HDL 66  LDLCALC 140*  TRIG 84  CHOLHDL 3.4   Lab Results  Component Value Date   HGBA1C 4.8 09/20/2020    Procedures since last visit: No results found.  Assessment/Plan 1. Senile osteoporosis -cont ca, D3, weightbearing exercise, f/u bone density with next mammogram - denosumab (PROLIA) injection 60 mg  2. Hyperlipidemia, unspecified hyperlipidemia type -continue to work on healthy diet and exercise, but recheck today in case her chinese food binge affected her labs - Lipid panel  3. Need for influenza vaccination - Flu Vaccine QUAD High Dose(Fluad)  4. Acquired hypothyroidism -cont her armour thyroid, tsh at goal  5. Hyperglycemia -sugar stable  6. Gastroesophageal reflux disease with esophagitis without hemorrhage -off nexium, now using prn tums -has another 4 yrs until next  endoscopy  7. Essential hypertension -bp controlled without meds, cont to monitor  Labs/tests ordered:   Lab Orders     Lipid panel  Next appt:  6 mos for CPE, fasting labs before  Vickie Melnik L. Zale Marcotte, D.O. [facility] [address], [place] [address] Cell Phone (Mon-Fri 8am-5pm):  [phone] On Call:  [phone] & follow prompts after 5pm & weekends Office Phone:  [phone] Office Fax:  [phone]

## 2020-09-28 ENCOUNTER — Encounter: Payer: Self-pay | Admitting: Internal Medicine

## 2020-09-28 LAB — LIPID PANEL
Cholesterol: 184 mg/dL (ref <200)
HDL: 61 mg/dL (ref >=50)
LDL Cholesterol (Calc): 108 mg/dL (calc) — ABNORMAL HIGH
Non-HDL Cholesterol (Calc): 123 mg/dL (calc) (ref <130)
Total CHOL/HDL Ratio: 3 (calc) (ref <5.0)
Triglycerides: 66 mg/dL (ref <150)

## 2020-09-28 NOTE — Progress Notes (Signed)
Donna Ware will be pleased to know that her bad cholesterol turned out much better on recheck so it must have been affected by her intake the night before.

## 2020-10-01 ENCOUNTER — Other Ambulatory Visit: Payer: Self-pay | Admitting: Internal Medicine

## 2020-10-01 NOTE — Telephone Encounter (Signed)
Patient requested refill Pended Rx and sent to Dr. Mariea Clonts for approval.  Epic LR: 05/29/2020 No Contract on File.

## 2020-10-01 NOTE — Telephone Encounter (Signed)
Donna Ware was just here and we missed doing a contract for her.  Can we print one out and have her swing by to sign it?

## 2020-10-02 NOTE — Telephone Encounter (Signed)
Contract is ready for Koral to sign, I called and left a message for her to call us back and set up a good time for her to come by.

## 2020-10-02 NOTE — Telephone Encounter (Signed)
Che came in today and signed her contract.

## 2020-10-08 ENCOUNTER — Encounter: Payer: Self-pay | Admitting: Internal Medicine

## 2020-10-08 DIAGNOSIS — M810 Age-related osteoporosis without current pathological fracture: Secondary | ICD-10-CM | POA: Diagnosis not present

## 2020-10-08 DIAGNOSIS — Z1231 Encounter for screening mammogram for malignant neoplasm of breast: Secondary | ICD-10-CM | POA: Diagnosis not present

## 2020-10-08 DIAGNOSIS — M8588 Other specified disorders of bone density and structure, other site: Secondary | ICD-10-CM | POA: Diagnosis not present

## 2020-10-08 LAB — HM MAMMOGRAPHY

## 2020-10-15 ENCOUNTER — Telehealth: Payer: Self-pay

## 2020-10-15 NOTE — Telephone Encounter (Signed)
Called and left a message for her to call back for results.  Results from bone density Osteoporosis T-score 3.1 Good news is l hip and spine density has improved and Right hip is stable. Continue Prolia

## 2020-10-15 NOTE — Telephone Encounter (Signed)
Results were given topatient.

## 2020-10-16 ENCOUNTER — Encounter: Payer: Self-pay | Admitting: Internal Medicine

## 2020-10-31 ENCOUNTER — Other Ambulatory Visit: Payer: Self-pay

## 2020-10-31 ENCOUNTER — Other Ambulatory Visit: Payer: Self-pay | Admitting: Internal Medicine

## 2020-10-31 MED ORDER — CLONAZEPAM 1 MG PO TABS
ORAL_TABLET | ORAL | 0 refills | Status: DC
Start: 2020-10-31 — End: 2020-11-30

## 2020-11-30 ENCOUNTER — Other Ambulatory Visit: Payer: Self-pay | Admitting: Internal Medicine

## 2020-11-30 DIAGNOSIS — F419 Anxiety disorder, unspecified: Secondary | ICD-10-CM

## 2020-11-30 NOTE — Telephone Encounter (Signed)
Last refill 10/31/20 Last labs 09/27/20 Last OV 09/27/20  Contract 10/02/20

## 2020-12-15 ENCOUNTER — Encounter (HOSPITAL_COMMUNITY): Payer: Self-pay | Admitting: *Deleted

## 2020-12-15 ENCOUNTER — Ambulatory Visit (HOSPITAL_COMMUNITY)
Admission: EM | Admit: 2020-12-15 | Discharge: 2020-12-15 | Disposition: A | Discharge status: Home / Self Care | Payer: Medicare Other | Attending: Family Medicine | Admitting: Family Medicine

## 2020-12-15 ENCOUNTER — Other Ambulatory Visit: Payer: Self-pay

## 2020-12-15 DIAGNOSIS — N3090 Cystitis, unspecified without hematuria: Secondary | ICD-10-CM | POA: Insufficient documentation

## 2020-12-15 LAB — POCT URINALYSIS DIPSTICK, ED / UC
Bilirubin Urine: NEGATIVE
Glucose, UA: NEGATIVE mg/dL
Ketones, ur: NEGATIVE mg/dL
Nitrite: POSITIVE — AB
Protein, ur: NEGATIVE mg/dL
Specific Gravity, Urine: 1.01 (ref 1.005–1.030)
Urobilinogen, UA: 0.2 mg/dL (ref 0.0–1.0)
pH: 6 (ref 5.0–8.0)

## 2020-12-15 MED ORDER — NITROFURANTOIN MONOHYD MACRO 100 MG PO CAPS: 100.0000 mg | ORAL_CAPSULE | Freq: Two times a day (BID) | ORAL | 0 refills | Status: DC

## 2020-12-15 NOTE — ED Triage Notes (Signed)
Pt reports she has been taking AZo for dysuria,urgency and kidney.

## 2020-12-17 NOTE — ED Provider Notes (Signed)
Donna Ware    ASSESSMENT & PLAN:  1. Cystitis     She declines Keflex. Requests Macrobid. Past cultures are sensative. Begin: Meds ordered this encounter  Medications  . nitrofurantoin, macrocrystal-monohydrate, (MACROBID) 100 MG capsule    Sig: Take 1 capsule (100 mg total) by mouth 2 (two) times daily.    Dispense:  14 capsule    Refill:  0   No signs of pyelonephritis. Discussed. Urine culture sent. Will notify patient of any significant results. Will follow up with her PCP or here if not showing improvement over the next 48 hours, sooner if needed.  Outlined signs and symptoms indicating need for more acute intervention. Patient verbalized understanding. After Visit Summary given.  SUBJECTIVE:  Donna Ware is a 69 y.o. female who complains of urinary frequency, urgency and dysuria for the past few d. Without associated flank pain, fever, chills, vaginal discharge or bleeding. Gross hematuria: not present. No specific aggravating or alleviating factors reported. No LE edema. Normal PO intake without n/v/d. Without specific abdominal pain. Ambulatory without difficulty. OTC treatment: AZO with little relief.  LMP: Patient's last menstrual period was 12/30/1999.   OBJECTIVE:  Vitals:   12/15/20 1717 12/15/20 1721  BP: (!) 144/79   Pulse: 84   Resp: 16   Temp: 98.7 F (37.1 C)   TempSrc: Oral   Weight:  59 kg  Height:  5' 6.5" (1.689 m)   General appearance: alert; no distress HENT: oropharynx: moist Lungs: unlabored respirations Abdomen: soft, non-tender Back: no CVA tenderness Extremities: no edema; symmetrical with no gross deformities Skin: warm and dry Neurologic: normal gait Psychological: alert and cooperative; normal mood and affect  Labs Reviewed  POCT URINALYSIS DIPSTICK, ED / UC - Abnormal; Notable for the following components:   Hgb urine dipstick SMALL (*)    Nitrite POSITIVE (*)    Leukocytes,Ua LARGE (*)    All other  components within normal limits    Allergies  Allergen Reactions  . Adhesive [Tape]   . Lactose Intolerance (Gi)   . Sulfa Antibiotics     Past Medical History:  Diagnosis Date  . Anxiety state, unspecified   . Barrett's esophagus   . Cystocele, midline   . Depressive disorder, not elsewhere classified   . Encounter for long-term (current) use of other medications   . Glycosuria   . Insomnia, unspecified   . Obstructive sleep apnea (adult) (pediatric)   . Oral aphthae   . Other and unspecified hyperlipidemia   . Proteinuria   . Reflux esophagitis   . Rosacea   . Senile osteoporosis   . STD (sexually transmitted disease)    HSV2  . Unspecified essential hypertension   . Unspecified hypothyroidism   . Viral hepatitis A without mention of hepatic coma    Social History   Socioeconomic History  . Marital status: Legally Separated    Spouse name: Not on file  . Number of children: Not on file  . Years of education: Not on file  . Highest education level: Not on file  Occupational History  . Not on file  Tobacco Use  . Smoking status: Never Smoker  . Smokeless tobacco: Never Used  Vaping Use  . Vaping Use: Never used  Substance and Sexual Activity  . Alcohol use: Not Currently  . Drug use: No  . Sexual activity: Not on file  Other Topics Concern  . Not on file  Social History Narrative  . Not on file  Social Determinants of Health   Financial Resource Strain: Not on file  Food Insecurity: Not on file  Transportation Needs: Not on file  Physical Activity: Not on file  Stress: Not on file  Social Connections: Not on file  Intimate Partner Violence: Not on file   Family History  Problem Relation Age of Onset  . Heart attack Father   . Heart attack Paternal Grandfather   . Heart disease Brother   . Breast cancer Mother        Vanessa Kick, MD 12/17/20 1044

## 2020-12-18 LAB — URINE CULTURE: Culture: >=100000 — AB

## 2020-12-31 ENCOUNTER — Other Ambulatory Visit: Payer: Self-pay | Admitting: Internal Medicine

## 2020-12-31 DIAGNOSIS — F419 Anxiety disorder, unspecified: Secondary | ICD-10-CM

## 2020-12-31 NOTE — Telephone Encounter (Signed)
Sent to Dr. Reed for approval  

## 2021-02-18 ENCOUNTER — Encounter: Payer: Self-pay | Admitting: Internal Medicine

## 2021-03-06 ENCOUNTER — Other Ambulatory Visit: Payer: Self-pay | Admitting: Internal Medicine

## 2021-03-27 ENCOUNTER — Ambulatory Visit: Payer: Medicare Other | Admitting: Internal Medicine

## 2021-03-29 ENCOUNTER — Ambulatory Visit (INDEPENDENT_AMBULATORY_CARE_PROVIDER_SITE_OTHER): Payer: Medicare Other

## 2021-03-29 ENCOUNTER — Other Ambulatory Visit: Payer: Self-pay

## 2021-03-29 ENCOUNTER — Ambulatory Visit: Payer: Medicare Other | Admitting: Orthopedic Surgery

## 2021-03-29 ENCOUNTER — Ambulatory Visit: Payer: Medicare Other

## 2021-03-29 DIAGNOSIS — M810 Age-related osteoporosis without current pathological fracture: Secondary | ICD-10-CM | POA: Diagnosis not present

## 2021-03-29 MED ORDER — DENOSUMAB 60 MG/ML ~~LOC~~ SOSY
60.0000 mg | PREFILLED_SYRINGE | Freq: Once | SUBCUTANEOUS | Status: AC
  Administered 2021-03-29: 60 mg via SUBCUTANEOUS

## 2021-04-01 DIAGNOSIS — Z23 Encounter for immunization: Secondary | ICD-10-CM | POA: Diagnosis not present

## 2021-04-26 DIAGNOSIS — R300 Dysuria: Secondary | ICD-10-CM | POA: Diagnosis not present

## 2021-04-26 DIAGNOSIS — N390 Urinary tract infection, site not specified: Secondary | ICD-10-CM | POA: Diagnosis not present

## 2021-04-26 DIAGNOSIS — A499 Bacterial infection, unspecified: Secondary | ICD-10-CM | POA: Diagnosis not present

## 2021-05-03 ENCOUNTER — Other Ambulatory Visit: Payer: Self-pay

## 2021-05-03 DIAGNOSIS — F419 Anxiety disorder, unspecified: Secondary | ICD-10-CM

## 2021-05-03 MED ORDER — CLONAZEPAM 1 MG PO TABS: ORAL_TABLET | ORAL | 1 refills | Status: DC

## 2021-05-03 NOTE — Telephone Encounter (Signed)
RX last filled on 12/31/2020 for 120/3 refills, treatment agreement on file from 10/01/2020

## 2021-05-17 ENCOUNTER — Other Ambulatory Visit: Payer: Self-pay

## 2021-05-17 ENCOUNTER — Encounter (HOSPITAL_COMMUNITY): Payer: Self-pay | Admitting: *Deleted

## 2021-05-17 ENCOUNTER — Ambulatory Visit (HOSPITAL_COMMUNITY)
Admission: EM | Admit: 2021-05-17 | Discharge: 2021-05-17 | Disposition: A | Discharge status: Home / Self Care | Payer: Medicare Other | Attending: Internal Medicine | Admitting: Internal Medicine

## 2021-05-17 DIAGNOSIS — N390 Urinary tract infection, site not specified: Secondary | ICD-10-CM | POA: Insufficient documentation

## 2021-05-17 DIAGNOSIS — N3000 Acute cystitis without hematuria: Secondary | ICD-10-CM | POA: Diagnosis not present

## 2021-05-17 DIAGNOSIS — B9629 Other Escherichia coli [E. coli] as the cause of diseases classified elsewhere: Secondary | ICD-10-CM | POA: Diagnosis not present

## 2021-05-17 DIAGNOSIS — R300 Dysuria: Secondary | ICD-10-CM | POA: Diagnosis not present

## 2021-05-17 LAB — POCT URINALYSIS DIPSTICK, ED / UC
Bilirubin Urine: NEGATIVE
Glucose, UA: NEGATIVE mg/dL
Hgb urine dipstick: NEGATIVE
Ketones, ur: NEGATIVE mg/dL
Nitrite: POSITIVE — AB
Protein, ur: NEGATIVE mg/dL
Specific Gravity, Urine: 1.015 (ref 1.005–1.030)
Urobilinogen, UA: 0.2 mg/dL (ref 0.0–1.0)
pH: 7 (ref 5.0–8.0)

## 2021-05-17 MED ORDER — CIPROFLOXACIN HCL 500 MG PO TABS: 500.0000 mg | ORAL_TABLET | Freq: Two times a day (BID) | ORAL | 0 refills | Status: DC

## 2021-05-17 NOTE — ED Triage Notes (Signed)
Pt reports UTI Sx's. Pt has been treated with Keflex and Sx's have returned.

## 2021-05-17 NOTE — ED Provider Notes (Signed)
[place]    CSN: [id number] Arrival date & time: 05/17/21  1734      History   Chief Complaint Chief Complaint  Patient presents with  . UTI  . Urinary Frequency  . Dysuria    HPI Donna Ware is a 70 y.o. female.   Patient presents today with several day history of recurrent UTI symptoms.  Reports that she was seen at [facility] approximately 1 month ago and given Keflex only to have symptoms recur soon after completing course of medication.  She does have a history of recurrent UTI and states current symptoms are similar to previous episodes of this condition.  Reports dysuria, urinary frequency, urinary urgency.  Denies any abdominal pain, vaginal symptoms, pelvic pain, fever, nausea, vomiting.  She is not tried any over-the-counter medications for symptom management.  She has not seen a urologist in the past.  Denies history of nephrolithiasis, single kidney, self-catheterization, recent urogenital procedure.     Past Medical History:  Diagnosis Date  . Anxiety state, unspecified   . Barrett's esophagus   . Cystocele, midline   . Depressive disorder, not elsewhere classified   . Encounter for long-term (current) use of other medications   . Glycosuria   . Insomnia, unspecified   . Obstructive sleep apnea (adult) (pediatric)   . Oral aphthae   . Other and unspecified hyperlipidemia   . Proteinuria   . Reflux esophagitis   . Rosacea   . Senile osteoporosis   . STD (sexually transmitted disease)    HSV2  . Unspecified essential hypertension   . Unspecified hypothyroidism   . Viral hepatitis A without mention of hepatic coma     Patient Active Problem List   Diagnosis Date Noted  . Hyperglycemia 06/18/2017  . Encounter for hepatitis C virus screening test for high risk patient 06/18/2017  . Rosacea 06/18/2017  . Lactose intolerance in adult 08/01/2014    Class: History of  . Possible exposure to STD 06/09/2013  . Annual physical exam  06/09/2013  . Senile osteoporosis   . Hypothyroidism   . Insomnia, unspecified   . Essential hypertension   . Depressive disorder, not elsewhere classified   . Reflux esophagitis   . Hyperlipidemia   . Obstructive sleep apnea (adult) (pediatric)     Past Surgical History:  Procedure Laterality Date  . BREAST ENHANCEMENT SURGERY  2010  . COSMETIC SURGERY  2009   for protruding ears  . GANGLION CYST EXCISION  2011   right palm  . teeth implants    . tooth implant  [id number]  . tummy tuck      OB History    Gravida  0   Para      Term      Preterm      AB      Living        SAB      IAB      Ectopic      Multiple      Live Births               Home Medications    Prior to Admission medications   Medication Sig Start Date End Date Taking? Authorizing Provider  ciprofloxacin (CIPRO) 500 MG tablet Take 1 tablet (500 mg total) by mouth every 12 (twelve) hours. 05/17/21  Yes Aarti Mankowski K, PA-C  B Complex Vitamins (B COMPLEX PO) Take by mouth. With vitamin c    [provider]  CALCIUM PO Take by mouth.    [provider]  Cholecalciferol (VITAMIN D PO) Take 2,000 Int'l Units by mouth.    [provider]  clonazePAM (KLONOPIN) 1 MG tablet TAKE 1 TABLET BY MOUTH UP TO FOUR TIMES DAILY AS NEEDED 05/03/21   Fargo, Amy E, NP  Collagen-Boron-Hyaluronic Acid ([facility] JOINT HEALTH TRIPLE ACTION) 10-5-3.3 MG TABS Take by mouth.    [provider]  estradiol (ESTRACE) 0.1 MG/GM vaginal cream PLACE 0.5(1/2) GRAM VAGINALLY 2 TIMES A WEEK AT BEDTIME 07/25/19   Regina Eck, CNM  Glucosamine-MSM-Hyaluronic Acd (JOINT HEALTH PO) Take by mouth.    [provider]  Ibuprofen 200 MG CAPS Take 200 mg by mouth daily as needed.    [provider]  loratadine-pseudoephedrine (CLARITIN-D 12 HOUR) 5-120 MG tablet Take 1 tablet by mouth 2 (two) times daily as needed.     [provider]  MELATONIN PO Take 5 mg by mouth.      [provider]  Multiple Vitamins-Minerals (MULTIVITAMIN PO) Take by mouth.    [provider]  nitrofurantoin, macrocrystal-monohydrate, (MACROBID) 100 MG capsule Take 1 capsule (100 mg total) by mouth 2 (two) times daily. 12/15/20   Vanessa Kick, MD  Probiotic Product (PROBIOTIC DAILY PO) Take by mouth daily.    [provider]  thyroid (ARMOUR THYROID) 60 MG tablet TAKE 1 TABLET BY MOUTH EVERY DAY FOR THYROID 03/06/21   Gayland Curry, DO    Family History Family History  Problem Relation Age of Onset  . Heart attack Father   . Heart attack Paternal Grandfather   . Heart disease Brother   . Breast cancer Mother     Social History Social History   Tobacco Use  . Smoking status: Never Smoker  . Smokeless tobacco: Never Used  Vaping Use  . Vaping Use: Never used  Substance Use Topics  . Alcohol use: Not Currently  . Drug use: No     Allergies   Adhesive [tape], Lactose intolerance (gi), and Sulfa antibiotics   Review of Systems Review of Systems  Constitutional: Negative for activity change, appetite change, fatigue and fever.  Respiratory: Negative for cough and shortness of breath.   Cardiovascular: Negative for chest pain.  Gastrointestinal: Negative for abdominal pain, diarrhea, nausea and vomiting.  Genitourinary: Positive for dysuria, frequency and urgency. Negative for genital sores, hematuria, menstrual problem, vaginal bleeding, vaginal discharge and vaginal pain.  Musculoskeletal: Negative for arthralgias and myalgias.  Neurological: Negative for dizziness, light-headedness and headaches.     Physical Exam Triage Vital Signs ED Triage Vitals  Enc Vitals Group     BP 05/17/21 1754 126/71     Pulse Rate 05/17/21 1754 82     Resp 05/17/21 1754 18     Temp 05/17/21 1754 98.9 F (37.2 C)     Temp src --      SpO2 05/17/21 1754 96 %     Weight --      Height --      Head Circumference --      Peak Flow --      Pain Score  05/17/21 1752 0     Pain Loc --      Pain Edu? --      Excl. in [place]? --    No data found.  Updated Vital Signs BP 126/71   Pulse 82   Temp 98.9 F (37.2 C)   Resp 18   LMP 12/30/1999   SpO2  96%   Visual Acuity Right Eye Distance:   Left Eye Distance:   Bilateral Distance:    Right Eye Near:   Left Eye Near:    Bilateral Near:     Physical Exam Vitals reviewed.  Constitutional:      General: She is awake. She is not in acute distress.    Appearance: Normal appearance. She is not ill-appearing.     Comments: Very pleasant female appears stated age in no acute distress  HENT:     Head: Normocephalic and atraumatic.  Cardiovascular:     Rate and Rhythm: Normal rate and regular rhythm.     Heart sounds: No murmur heard.   Pulmonary:     Effort: Pulmonary effort is normal.     Breath sounds: Normal breath sounds. No wheezing, rhonchi or rales.     Comments: Clear to auscultation bilaterally Abdominal:     General: Bowel sounds are normal.     Palpations: Abdomen is soft.     Tenderness: There is no abdominal tenderness. There is no right CVA tenderness, left CVA tenderness, guarding or rebound.     Comments: Benign abdominal exam; nontender to palpation.  No CVA tenderness.  Psychiatric:        Behavior: Behavior is cooperative.      UC Treatments / Results  Labs (all labs ordered are listed, but only abnormal results are displayed) Labs Reviewed  POCT URINALYSIS DIPSTICK, ED / UC - Abnormal; Notable for the following components:      Result Value   Nitrite POSITIVE (*)    Leukocytes,Ua LARGE (*)    All other components within normal limits  URINE CULTURE    EKG   Radiology No results found.  Procedures Procedures (including critical care time)  Medications Ordered in UC Medications - No data to display  Initial Impression / Assessment and Plan / UC Course  I have reviewed the triage vital signs and the nursing notes.  Pertinent labs & imaging  results that were available during my care of the patient were reviewed by me and considered in my medical decision making (see chart for details).     UA positive for nitrates indicating UTI.  Urine culture obtained today-results pending.  Patient has failed Keflex so we will try ciprofloxacin given age and medication allergies.  She was encouraged to follow-up with urology given recurrent UTIs and was given contact information for local urology group.  Discussed potential need to change antibiotics based on susceptibilities identified on culture.  She is to drink plenty of fluid.  Discussed alarm symptoms that warrant emergent evaluation.  Strict return precautions given to which patient expressed understanding.  Final Clinical Impressions(s) / UC Diagnoses   Final diagnoses:  Lower urinary tract infectious disease  Dysuria  Acute cystitis without hematuria     Discharge Instructions     Take antibiotic twice daily for 5 days.  Make sure you are drinking plenty of fluid.  We will be in touch if we need to change her antibiotic based on your urine culture results.  If you have any worsening symptoms please return for reevaluation.  If you have any sudden muscle or joint pain please stop the medication and be reevaluated.    ED Prescriptions    Medication Sig Dispense Auth. Provider   ciprofloxacin (CIPRO) 500 MG tablet Take 1 tablet (500 mg total) by mouth every 12 (twelve) hours. 10 tablet Meagon Duskin, Derry Skill, PA-C     PDMP not reviewed  this encounter.   Terrilee Croak, PA-C 05/17/21 [id number]

## 2021-05-17 NOTE — Discharge Instructions (Signed)
Take antibiotic twice daily for 5 days.  Make sure you are drinking plenty of fluid.  We will be in touch if we need to change her antibiotic based on your urine culture results.  If you have any worsening symptoms please return for reevaluation.  If you have any sudden muscle or joint pain please stop the medication and be reevaluated.

## 2021-05-20 LAB — URINE CULTURE: Culture: 40000 — AB

## 2021-06-28 DIAGNOSIS — H2513 Age-related nuclear cataract, bilateral: Secondary | ICD-10-CM | POA: Diagnosis not present

## 2021-07-03 ENCOUNTER — Other Ambulatory Visit: Payer: Self-pay | Admitting: Orthopedic Surgery

## 2021-07-03 DIAGNOSIS — F419 Anxiety disorder, unspecified: Secondary | ICD-10-CM

## 2021-07-04 DIAGNOSIS — D225 Melanocytic nevi of trunk: Secondary | ICD-10-CM | POA: Diagnosis not present

## 2021-07-04 DIAGNOSIS — L718 Other rosacea: Secondary | ICD-10-CM | POA: Diagnosis not present

## 2021-07-04 DIAGNOSIS — L821 Other seborrheic keratosis: Secondary | ICD-10-CM | POA: Diagnosis not present

## 2021-07-04 DIAGNOSIS — L918 Other hypertrophic disorders of the skin: Secondary | ICD-10-CM | POA: Diagnosis not present

## 2021-08-01 ENCOUNTER — Other Ambulatory Visit: Payer: Self-pay | Admitting: Orthopedic Surgery

## 2021-08-01 DIAGNOSIS — F419 Anxiety disorder, unspecified: Secondary | ICD-10-CM

## 2021-08-01 NOTE — Telephone Encounter (Signed)
Narcotic contract will expire 10/04/2021

## 2021-08-05 ENCOUNTER — Ambulatory Visit (HOSPITAL_COMMUNITY)
Admission: EM | Admit: 2021-08-05 | Discharge: 2021-08-05 | Disposition: A | Discharge status: Home / Self Care | Payer: Medicare Other | Attending: Emergency Medicine | Admitting: Emergency Medicine

## 2021-08-05 ENCOUNTER — Other Ambulatory Visit: Payer: Self-pay

## 2021-08-05 ENCOUNTER — Encounter (HOSPITAL_COMMUNITY): Payer: Self-pay | Admitting: Emergency Medicine

## 2021-08-05 DIAGNOSIS — R300 Dysuria: Secondary | ICD-10-CM | POA: Diagnosis not present

## 2021-08-05 DIAGNOSIS — N3001 Acute cystitis with hematuria: Secondary | ICD-10-CM | POA: Insufficient documentation

## 2021-08-05 DIAGNOSIS — R350 Frequency of micturition: Secondary | ICD-10-CM | POA: Diagnosis not present

## 2021-08-05 DIAGNOSIS — N390 Urinary tract infection, site not specified: Secondary | ICD-10-CM | POA: Insufficient documentation

## 2021-08-05 LAB — POCT URINALYSIS DIPSTICK, ED / UC
Bilirubin Urine: NEGATIVE
Bilirubin Urine: NEGATIVE
Glucose, UA: NEGATIVE mg/dL
Glucose, UA: NEGATIVE mg/dL
Hgb urine dipstick: NEGATIVE
Ketones, ur: NEGATIVE mg/dL
Ketones, ur: NEGATIVE mg/dL
Nitrite: NEGATIVE
Nitrite: NEGATIVE
Protein, ur: NEGATIVE mg/dL
Protein, ur: NEGATIVE mg/dL
Specific Gravity, Urine: 1.01 (ref 1.005–1.030)
Specific Gravity, Urine: 1.01 (ref 1.005–1.030)
Urobilinogen, UA: 0.2 mg/dL (ref 0.0–1.0)
Urobilinogen, UA: 0.2 mg/dL (ref 0.0–1.0)
pH: 7 (ref 5.0–8.0)
pH: 7 (ref 5.0–8.0)

## 2021-08-05 MED ORDER — CIPROFLOXACIN HCL 500 MG PO TABS: 500.0000 mg | ORAL_TABLET | Freq: Two times a day (BID) | ORAL | 0 refills | Status: DC

## 2021-08-05 NOTE — ED Notes (Signed)
Patient in bathroom collecting specimen

## 2021-08-05 NOTE — Discharge Instructions (Addendum)
Please take ciprofloxacin twice daily for 5 days.  We will contact you if we need to change antibiotics based on your urine culture.  Make sure you are drinking plenty of fluid.  Please follow-up with urology as we discussed.  If you have any worsening symptoms including fever, increased abdominal pain, persistent urinary tract infection symptoms you need to be reevaluated.  I do recommend that you follow-up with your primary care provider within a few weeks to recheck your urine to make sure blood that was noticed today resolves following clearing of infection.

## 2021-08-05 NOTE — ED Provider Notes (Signed)
[place]    CSN: [id number] Arrival date & time: 08/05/21  1407      History   Chief Complaint Chief Complaint  Patient presents with   Recurrent UTI    HPI Donna Ware is a 70 y.o. female.   Patient presents today with a 1 week history of UTI symptoms.  She has a history of recurrent UTIs and states current symptoms are similar to previous episodes of this condition.  She was last seen and treated 05/17/2021 at which point urine culture grew E. coli that was sensitive to all antibiotics.  She was treated with ciprofloxacin and reports symptoms resolved following use of this medication.  She is previously been prescribed Keflex numerous times and is found this is no longer effective.  She has not seen a urologist but is open to seeing 1 given she continues to have urinary tract infections every few months.  She denies any history of nephrolithiasis, single kidney, recent urogenital procedure, self-catheterization.  Reports dysuria, urinary frequency, urinary urgency.  Denies any abdominal pain, pelvic discomfort, vaginal symptoms, fever, nausea, vomiting.  She does have a history of atrophic vaginitis but is prescribed Estrace vaginal cream which she has been using as prescribed.   Past Medical History:  Diagnosis Date   Anxiety state, unspecified    Barrett's esophagus    Cystocele, midline    Depressive disorder, not elsewhere classified    Encounter for long-term (current) use of other medications    Glycosuria    Insomnia, unspecified    Obstructive sleep apnea (adult) (pediatric)    Oral aphthae    Other and unspecified hyperlipidemia    Proteinuria    Reflux esophagitis    Rosacea    Senile osteoporosis    STD (sexually transmitted disease)    HSV2   Unspecified essential hypertension    Unspecified hypothyroidism    Viral hepatitis A without mention of hepatic coma     Patient Active Problem List   Diagnosis Date Noted   Hyperglycemia 06/18/2017    Encounter for hepatitis C virus screening test for high risk patient 06/18/2017   Rosacea 06/18/2017   Lactose intolerance in adult 08/01/2014    Class: History of   Possible exposure to STD 06/09/2013   Annual physical exam 06/09/2013   Senile osteoporosis    Hypothyroidism    Insomnia, unspecified    Essential hypertension    Depressive disorder, not elsewhere classified    Reflux esophagitis    Hyperlipidemia    Obstructive sleep apnea (adult) (pediatric)     Past Surgical History:  Procedure Laterality Date   BREAST ENHANCEMENT SURGERY  2010   COSMETIC SURGERY  2009   for protruding ears   GANGLION CYST EXCISION  2011   right palm   teeth implants     tooth implant  [id number]   tummy tuck      OB History     Gravida  0   Para      Term      Preterm      AB      Living         SAB      IAB      Ectopic      Multiple      Live Births               Home Medications    Prior to Admission medications   Medication Sig Start Date End Date  Taking? Authorizing Provider  estradiol (ESTRACE) 0.1 MG/GM vaginal cream PLACE 0.5(1/2) GRAM VAGINALLY 2 TIMES A WEEK AT BEDTIME 07/25/19  Yes Regina Eck, CNM  Multiple Vitamins-Minerals (MULTIVITAMIN PO) Take by mouth.   Yes [provider]  thyroid (ARMOUR THYROID) 60 MG tablet TAKE 1 TABLET BY MOUTH EVERY DAY FOR THYROID 03/06/21  Yes Reed, Tiffany L, DO  B Complex Vitamins (B COMPLEX PO) Take by mouth. With vitamin c    [provider]  CALCIUM PO Take by mouth.    [provider]  Cholecalciferol (VITAMIN D PO) Take 2,000 Int'l Units by mouth.    [provider]  ciprofloxacin (CIPRO) 500 MG tablet Take 1 tablet (500 mg total) by mouth every 12 (twelve) hours. 08/05/21   Tamika Shropshire, Junie Panning K, PA-C  clonazePAM (KLONOPIN) 1 MG tablet TAKE 1 TABLET BY MOUTH UP TO FOUR TIMES DAILY AS NEEDED 08/01/21   Fargo, Amy E, NP  Collagen-Boron-Hyaluronic Acid ([employer] JOINT HEALTH TRIPLE  ACTION) 10-5-3.3 MG TABS Take by mouth.    [provider]  Glucosamine-MSM-Hyaluronic Acd (JOINT HEALTH PO) Take by mouth.    [provider]  Ibuprofen 200 MG CAPS Take 200 mg by mouth daily as needed.    [provider]  loratadine-pseudoephedrine (CLARITIN-D 12 HOUR) 5-120 MG tablet Take 1 tablet by mouth 2 (two) times daily as needed.     [provider]  MELATONIN PO Take 5 mg by mouth.     [provider]  nitrofurantoin, macrocrystal-monohydrate, (MACROBID) 100 MG capsule Take 1 capsule (100 mg total) by mouth 2 (two) times daily. Patient not taking: Reported on 08/05/2021 12/15/20   Vanessa Kick, MD  Probiotic Product (PROBIOTIC DAILY PO) Take by mouth daily.    [provider]    Family History Family History  Problem Relation Age of Onset   Heart attack Father    Heart attack Paternal Grandfather    Heart disease Brother    Breast cancer Mother     Social History Social History   Tobacco Use   Smoking status: Never   Smokeless tobacco: Never  Vaping Use   Vaping Use: Never used  Substance Use Topics   Alcohol use: Not Currently   Drug use: No     Allergies   Adhesive [tape], Lactose intolerance (gi), and Sulfa antibiotics   Review of Systems Review of Systems  Constitutional:  Negative for activity change, appetite change, fatigue and fever.  Respiratory:  Negative for cough and shortness of breath.   Cardiovascular:  Negative for chest pain.  Gastrointestinal:  Negative for abdominal pain, diarrhea, nausea and vomiting.  Genitourinary:  Positive for dysuria, frequency and urgency. Negative for vaginal bleeding, vaginal discharge and vaginal pain.  Musculoskeletal:  Negative for arthralgias, back pain and myalgias.  Neurological:  Negative for dizziness, light-headedness and headaches.    Physical Exam Triage Vital Signs ED Triage Vitals  Enc Vitals Group     BP 08/05/21 1612 127/74     Pulse Rate  08/05/21 1612 75     Resp 08/05/21 1612 18     Temp 08/05/21 1612 98.1 F (36.7 C)     Temp Source 08/05/21 1612 Oral     SpO2 08/05/21 1612 100 %     Weight --      Height --      Head Circumference --      Peak Flow --      Pain Score 08/05/21 1607 5  Pain Loc --      Pain Edu? --      Excl. in [place]? --    No data found.  Updated Vital Signs BP 127/74 (BP Location: Right Arm)   Pulse 75   Temp 98.1 F (36.7 C) (Oral)   Resp 18   LMP 12/30/1999   SpO2 100%   Visual Acuity Right Eye Distance:   Left Eye Distance:   Bilateral Distance:    Right Eye Near:   Left Eye Near:    Bilateral Near:     Physical Exam Vitals reviewed.  Constitutional:      General: She is awake. She is not in acute distress.    Appearance: Normal appearance. She is normal weight. She is not ill-appearing.     Comments: Very pleasant female appears stated age in no acute distress sitting comfortably in exam room  HENT:     Head: Normocephalic and atraumatic.  Cardiovascular:     Rate and Rhythm: Normal rate and regular rhythm.     Heart sounds: Normal heart sounds, S1 normal and S2 normal. No murmur heard. Pulmonary:     Effort: Pulmonary effort is normal.     Breath sounds: Normal breath sounds. No wheezing, rhonchi or rales.     Comments: Clear to auscultation bilaterally Abdominal:     General: Bowel sounds are normal.     Palpations: Abdomen is soft.     Tenderness: There is no abdominal tenderness. There is no right CVA tenderness, left CVA tenderness, guarding or rebound.     Comments: Benign abdominal exam; no tenderness palpation.  No CVA tenderness.  Psychiatric:        Behavior: Behavior is cooperative.       UC Treatments / Results  Labs (all labs ordered are listed, but only abnormal results are displayed) Labs Reviewed  POCT URINALYSIS DIPSTICK, ED / UC - Abnormal; Notable for the following components:      Result Value   Leukocytes,Ua MODERATE (*)    All other  components within normal limits  POCT URINALYSIS DIPSTICK, ED / UC - Abnormal; Notable for the following components:   Hgb urine dipstick SMALL (*)    Leukocytes,Ua MODERATE (*)    All other components within normal limits  URINE CULTURE    EKG   Radiology No results found.  Procedures Procedures (including critical care time)  Medications Ordered in UC Medications - No data to display  Initial Impression / Assessment and Plan / UC Course  I have reviewed the triage vital signs and the nursing notes.  Pertinent labs & imaging results that were available during my care of the patient were reviewed by me and considered in my medical decision making (see chart for details).      Patient was restarted on ciprofloxacin as she is not taking this in several months and did well on this medication in the past.  We will send urine off for culture and discuss potentially change antibiotics based on susceptibilities identified on culture.  Encouraged her to rest and drink plenty of fluid.  Given her current symptoms we discussed potential utility of seeing urologist and patient was encouraged to follow-up with local group.  Discussed alarm symptoms that would warrant emergent evaluation.  Strict return precautions given to which patient expressed understanding.  Final Clinical Impressions(s) / UC Diagnoses   Final diagnoses:  Recurrent UTI  Acute cystitis with hematuria  Dysuria  Urinary frequency     Discharge Instructions  Please take ciprofloxacin twice daily for 5 days.  We will contact you if we need to change antibiotics based on your urine culture.  Make sure you are drinking plenty of fluid.  Please follow-up with urology as we discussed.  If you have any worsening symptoms including fever, increased abdominal pain, persistent urinary tract infection symptoms you need to be reevaluated.  I do recommend that you follow-up with your primary care provider within a few weeks to  recheck your urine to make sure blood that was noticed today resolves following clearing of infection.     ED Prescriptions     Medication Sig Dispense Auth. Provider   ciprofloxacin (CIPRO) 500 MG tablet Take 1 tablet (500 mg total) by mouth every 12 (twelve) hours. 10 tablet Grady Lucci, Derry Skill, PA-C      PDMP not reviewed this encounter.   Terrilee Croak, PA-C 08/05/21 1634

## 2021-08-05 NOTE — ED Triage Notes (Signed)
Patient has a history of uti's.  Patient thinks this is whats going on now.  Patient has urgency to urinate, has a tickle, stinging, burning feeling in urethra.  Urine has a distinct odor

## 2021-08-07 LAB — URINE CULTURE: Culture: >=100000 — AB

## 2021-09-01 ENCOUNTER — Other Ambulatory Visit: Payer: Self-pay | Admitting: Orthopedic Surgery

## 2021-09-01 DIAGNOSIS — F419 Anxiety disorder, unspecified: Secondary | ICD-10-CM

## 2021-09-03 ENCOUNTER — Other Ambulatory Visit: Payer: Self-pay | Admitting: *Deleted

## 2021-09-03 DIAGNOSIS — F419 Anxiety disorder, unspecified: Secondary | ICD-10-CM

## 2021-09-03 MED ORDER — CLONAZEPAM 1 MG PO TABS
ORAL_TABLET | ORAL | 0 refills | Status: DC
Start: 2021-09-03 — End: 2021-10-03

## 2021-09-03 NOTE — Telephone Encounter (Signed)
Patient requested refill.  Epic LR: 08/01/2021 Contract on File Upcoming appointment scheduled.  Pended Rx and sent to Amy for approval.

## 2021-09-24 ENCOUNTER — Encounter: Payer: Medicare Other | Admitting: Nurse Practitioner

## 2021-09-24 ENCOUNTER — Encounter: Payer: Medicare Other | Admitting: Family

## 2021-09-27 ENCOUNTER — Encounter: Payer: Medicare Other | Admitting: Orthopedic Surgery

## 2021-09-27 ENCOUNTER — Ambulatory Visit: Payer: Medicare Other | Admitting: Orthopedic Surgery

## 2021-10-02 ENCOUNTER — Other Ambulatory Visit: Payer: Self-pay | Admitting: Orthopedic Surgery

## 2021-10-02 DIAGNOSIS — F419 Anxiety disorder, unspecified: Secondary | ICD-10-CM

## 2021-10-03 NOTE — Telephone Encounter (Signed)
Patient has request refill on "Clonazepam". Patient last refill was 09/03/2021. Patient is due for refill. Medication pend and sent to PCP Yvonna Alanis, NP . Please Advise.

## 2021-10-10 ENCOUNTER — Ambulatory Visit (INDEPENDENT_AMBULATORY_CARE_PROVIDER_SITE_OTHER): Payer: Medicare Other | Admitting: Orthopedic Surgery

## 2021-10-10 ENCOUNTER — Encounter: Payer: Self-pay | Admitting: Orthopedic Surgery

## 2021-10-10 ENCOUNTER — Other Ambulatory Visit: Payer: Self-pay

## 2021-10-10 VITALS — BP 126/76 | HR 88 | Temp 97.8°F | Ht 66.5 in | Wt 137.5 lb

## 2021-10-10 DIAGNOSIS — E785 Hyperlipidemia, unspecified: Secondary | ICD-10-CM | POA: Diagnosis not present

## 2021-10-10 DIAGNOSIS — Z0000 Encounter for general adult medical examination without abnormal findings: Secondary | ICD-10-CM

## 2021-10-10 DIAGNOSIS — E039 Hypothyroidism, unspecified: Secondary | ICD-10-CM | POA: Diagnosis not present

## 2021-10-10 DIAGNOSIS — M810 Age-related osteoporosis without current pathological fracture: Secondary | ICD-10-CM | POA: Diagnosis not present

## 2021-10-10 DIAGNOSIS — K2100 Gastro-esophageal reflux disease with esophagitis, without bleeding: Secondary | ICD-10-CM

## 2021-10-10 DIAGNOSIS — N390 Urinary tract infection, site not specified: Secondary | ICD-10-CM | POA: Diagnosis not present

## 2021-10-10 DIAGNOSIS — I10 Essential (primary) hypertension: Secondary | ICD-10-CM

## 2021-10-10 DIAGNOSIS — F419 Anxiety disorder, unspecified: Secondary | ICD-10-CM

## 2021-10-10 DIAGNOSIS — Z780 Asymptomatic menopausal state: Secondary | ICD-10-CM

## 2021-10-10 MED ORDER — DENOSUMAB 60 MG/ML ~~LOC~~ SOSY
60.0000 mg | PREFILLED_SYRINGE | Freq: Once | SUBCUTANEOUS | Status: AC
  Administered 2021-10-10: 60 mg via SUBCUTANEOUS

## 2021-10-10 NOTE — Patient Instructions (Addendum)
Cranberry supplement 450 mg- take one tablet daily- may purchase over the counter.   Download a calorie tracker on phone- try to track calories for one week ( goal < 1500 calories daily for weight loss). App I like "My fitness pal"   Please schedule appointment for fasting labs.

## 2021-10-10 NOTE — Progress Notes (Signed)
Careteam: Patient Care Team: Yvonna Alanis, NP as PCP - General (Adult Health Nurse Practitioner) [place], [place], [place] (Optometry)  Seen by: Windell Moulding, AGNP-C  PLACE OF SERVICE:  [place]  Advanced Directive information    Allergies  Allergen Reactions   Adhesive [Tape]    Lactose Intolerance (Gi)    Sulfa Antibiotics     Chief Complaint  Patient presents with   Annual Exam    Patient presents today for a physical.    Quality Metric Gaps    COVID booster and flu vaccine     HPI: Patient is a 70 y.o. female seen today for annual physical exam.   She is not fasting for labs today. Discussed coming in for lab visit next week.   EKG done- NSR.   Upset about weight in office today. Reports she has gained 7 lbs since last visit. She admits to not exercising as much in the last couple weeks. Discussed BMI and how she is still in healthy range. Education given about calorie counting and exercise. Discussed calorie tracker app on phone as well.   She presented to the ED for UTI 07/2021. Given cipro. She completed antibiotics prescribed. Denies urinary symptoms at this time. Discussed prevention and trying cranberry supplement daily.   Breast implants tummy tuck a few years ago. Very happy with her surgeries. Denies pain at this time. Denies changes to breasts or stomach.   Prolia injection given today.   Grandmother and mother had breast cancer- estrognen sensitive. She is shceduled to have mammogram this month with [facility].   Continues to take klonopin prn for anxiety. Denies recent panic attacks.   Denies memory issues. Mother had Alzheimer's.   Continues to take thyroid medication daily.     Just received flu vaccine. Plans to get 3rd booster next month.     Review of Systems:  Review of Systems  Constitutional:  Negative for chills, fever, malaise/fatigue and weight loss.  HENT:  Negative for congestion and sore throat.   Eyes:  Negative for discharge and  redness.       Cataracts  Respiratory:  Negative for cough, shortness of breath and wheezing.   Cardiovascular:  Negative for chest pain and leg swelling.  Gastrointestinal:  Negative for abdominal pain, blood in stool, constipation, diarrhea, heartburn, nausea and vomiting.  Genitourinary:  Negative for dysuria, frequency and hematuria.  Musculoskeletal:  Negative for falls, joint pain and myalgias.  Skin: Negative.   Neurological:  Negative for dizziness, tingling, weakness and headaches.  Psychiatric/Behavioral:  Negative for depression. The patient is nervous/anxious. The patient does not have insomnia.    Past Medical History:  Diagnosis Date   Anxiety state, unspecified    Barrett's esophagus    Cystocele, midline    Depressive disorder, not elsewhere classified    Encounter for long-term (current) use of other medications    Glycosuria    Insomnia, unspecified    Obstructive sleep apnea (adult) (pediatric)    Oral aphthae    Other and unspecified hyperlipidemia    Proteinuria    Reflux esophagitis    Rosacea    Senile osteoporosis    STD (sexually transmitted disease)    HSV2   Unspecified essential hypertension    Unspecified hypothyroidism    Viral hepatitis A without mention of hepatic coma    Past Surgical History:  Procedure Laterality Date   BREAST ENHANCEMENT SURGERY  2010   COSMETIC SURGERY  2009   for protruding  ears   GANGLION CYST EXCISION  2011   right palm   teeth implants     tooth implant  [id number]   tummy tuck     Social History:   reports that she has never smoked. She has never used smokeless tobacco. She reports that she does not currently use alcohol. She reports that she does not use drugs.  Family History  Problem Relation Age of Onset   Heart attack Father    Heart attack Paternal Grandfather    Heart disease Brother    Breast cancer Mother     Medications: Patient's Medications  New Prescriptions   No medications on file   Previous Medications   CIPROFLOXACIN (CIPRO) 500 MG TABLET    Take 1 tablet (500 mg total) by mouth every 12 (twelve) hours.   CLONAZEPAM (KLONOPIN) 1 MG TABLET    TAKE 1 TABLET BY MOUTH UP TO FOUR TIMES DAILY AS NEEDED   ESTRADIOL (ESTRACE) 0.1 MG/GM VAGINAL CREAM    PLACE 0.5(1/2) GRAM VAGINALLY 2 TIMES A WEEK AT BEDTIME   IBUPROFEN 200 MG CAPS    Take 200 mg by mouth daily as needed.   LORATADINE-PSEUDOEPHEDRINE (CLARITIN-D 12 HOUR) 5-120 MG TABLET    Take 1 tablet by mouth 2 (two) times daily as needed.    MELATONIN PO    Take 5 mg by mouth.    PROBIOTIC PRODUCT (PROBIOTIC DAILY PO)    Take by mouth daily.   THYROID (ARMOUR THYROID) 60 MG TABLET    TAKE 1 TABLET BY MOUTH EVERY DAY FOR THYROID  Modified Medications   No medications on file  Discontinued Medications   B COMPLEX VITAMINS (B COMPLEX PO)    Take by mouth. With vitamin c   CALCIUM PO    Take by mouth.   CHOLECALCIFEROL (VITAMIN D PO)    Take 2,000 Int'l Units by mouth.   COLLAGEN-BORON-HYALURONIC ACID ([facility] TRIPLE ACTION) 10-5-3.3 MG TABS    Take by mouth.   GLUCOSAMINE-MSM-HYALURONIC ACD (JOINT HEALTH PO)    Take by mouth.   MULTIPLE VITAMINS-MINERALS (MULTIVITAMIN PO)    Take by mouth.   NITROFURANTOIN, MACROCRYSTAL-MONOHYDRATE, (MACROBID) 100 MG CAPSULE    Take 1 capsule (100 mg total) by mouth 2 (two) times daily.    Physical Exam:  Vitals:   10/10/21 1601  BP: 126/76  Pulse: 88  Temp: 97.8 F (36.6 C)  SpO2: 98%  Weight: 137 lb 8 oz (62.4 kg)  Height: 5' 6.5" (1.689 m)   Body mass index is 21.86 kg/m. Wt Readings from Last 3 Encounters:  10/10/21 137 lb 8 oz (62.4 kg)  12/15/20 130 lb (59 kg)  09/27/20 130 lb (59 kg)    Physical Exam Vitals reviewed.  Constitutional:      General: She is not in acute distress. HENT:     Head: Normocephalic.     Right Ear: There is no impacted cerumen.     Left Ear: There is no impacted cerumen.     Nose: Nose normal.     Mouth/Throat:     Mouth:  Mucous membranes are moist.  Eyes:     General:        Right eye: No discharge.        Left eye: No discharge.  Neck:     Vascular: No carotid bruit.  Cardiovascular:     Rate and Rhythm: Normal rate and regular rhythm.     Pulses: Normal pulses.  Heart sounds: Normal heart sounds. No murmur heard. Pulmonary:     Effort: Pulmonary effort is normal. No respiratory distress.     Breath sounds: Normal breath sounds. No wheezing.  Abdominal:     General: Abdomen is flat. Bowel sounds are normal. There is no distension.     Palpations: Abdomen is soft.     Tenderness: There is no abdominal tenderness.  Musculoskeletal:     Cervical back: Normal range of motion.     Right lower leg: No edema.     Left lower leg: No edema.  Lymphadenopathy:     Cervical: No cervical adenopathy.  Skin:    General: Skin is warm and dry.     Capillary Refill: Capillary refill takes less than 2 seconds.  Neurological:     General: No focal deficit present.     Mental Status: She is alert and oriented to person, place, and time.  Psychiatric:        Mood and Affect: Mood normal.        Behavior: Behavior normal.    Labs reviewed: Basic Metabolic Panel: No results for input(s): NA, K, CL, CO2, GLUCOSE, BUN, CREATININE, CALCIUM, MG, PHOS, TSH in the last 8760 hours. Liver Function Tests: No results for input(s): AST, ALT, ALKPHOS, BILITOT, PROT, ALBUMIN in the last 8760 hours. No results for input(s): LIPASE, AMYLASE in the last 8760 hours. No results for input(s): AMMONIA in the last 8760 hours. CBC: No results for input(s): WBC, NEUTROABS, HGB, HCT, MCV, PLT in the last 8760 hours. Lipid Panel: No results for input(s): CHOL, HDL, LDLCALC, TRIG, CHOLHDL, LDLDIRECT in the last 8760 hours. TSH: No results for input(s): TSH in the last 8760 hours. A1C: Lab Results  Component Value Date   HGBA1C 4.8 09/20/2020     Assessment/Plan 1. Essential hypertension - controlled without medication -  EKG 12-Lead- NSR - CBC with Differential/Platelets; Future - CMP; Future  2. Hyperlipidemia, unspecified hyperlipidemia type - LDL 108 09/27/2020 - not fasting today, discussed having lab visit in next week - Lipid Panel; Future  3. Acquired hypothyroidism - TSH 2.49 09/20/2020 - TSH; Future  4. Senile osteoporosis -DEXA 09/2020 t-score -3.1 - denosumab (PROLIA) injection 60 mg  5. Frequent UTI - 09/08 urine culture > 100,000 E.coli- treated with cipro - no urinary symptoms at this time - recommend starting cranberry 450 mg po daily for prevention  6. Gastroesophageal reflux disease with esophagitis without hemorrhage - denies today  7. Anxiety - denies panic attacks - refused to try SSRI - will try weaning trial and reduce to tid   - start clonazepam 1 mg po tid prn- start 11/03/2021 - needs controlled substance contract signed  - urine drug screen  Labs/tests: cbc/diff, cmp, lipid panel, TSH, urine drug screen, MMSE  Total time: 35 minutes. Greater than 50% of total time spent doing patient education on preventative care including vaccinations, screening tests, and symptom and medication management.    Next appt: Visit date not found  [place], [facility] [phone]

## 2021-10-11 ENCOUNTER — Ambulatory Visit: Payer: Medicare Other | Admitting: Orthopedic Surgery

## 2021-10-11 MED ORDER — CLONAZEPAM 1 MG PO TABS: 1.0000 mg | ORAL_TABLET | Freq: Three times a day (TID) | ORAL | 1 refills | Status: DC | PRN

## 2021-10-14 DIAGNOSIS — Z1231 Encounter for screening mammogram for malignant neoplasm of breast: Secondary | ICD-10-CM | POA: Diagnosis not present

## 2021-10-16 ENCOUNTER — Other Ambulatory Visit: Payer: Self-pay

## 2021-10-16 ENCOUNTER — Other Ambulatory Visit: Payer: Medicare Other

## 2021-10-16 DIAGNOSIS — E039 Hypothyroidism, unspecified: Secondary | ICD-10-CM | POA: Diagnosis not present

## 2021-10-16 DIAGNOSIS — E785 Hyperlipidemia, unspecified: Secondary | ICD-10-CM | POA: Diagnosis not present

## 2021-10-16 DIAGNOSIS — I10 Essential (primary) hypertension: Secondary | ICD-10-CM

## 2021-10-16 DIAGNOSIS — F419 Anxiety disorder, unspecified: Secondary | ICD-10-CM

## 2021-10-18 ENCOUNTER — Telehealth: Payer: Self-pay

## 2021-10-18 NOTE — Telephone Encounter (Signed)
-----   Message from Yvonna Alanis, NP sent at 10/17/2021 10:36 PM EDT ----- Mammogram negative for malignancy. Repeat follow up screening mammogram in 1 year.

## 2021-10-18 NOTE — Telephone Encounter (Signed)
Called patient. No answer. LMOM to return call.

## 2021-10-20 LAB — DRUG MONITOR, PANEL 1, W/CONF, URINE
Alphahydroxyalprazolam: NEGATIVE ng/mL (ref <25)
Alphahydroxymidazolam: NEGATIVE ng/mL (ref <50)
Alphahydroxytriazolam: NEGATIVE ng/mL (ref <50)
Aminoclonazepam: 394 ng/mL — ABNORMAL HIGH (ref <25)
Amphetamines: NEGATIVE ng/mL (ref <500)
Barbiturates: NEGATIVE ng/mL (ref <300)
Benzodiazepines: POSITIVE ng/mL — AB (ref <100)
Cocaine Metabolite: NEGATIVE ng/mL (ref <150)
Creatinine: 82.1 mg/dL (ref >=20.0)
Hydroxyethylflurazepam: NEGATIVE ng/mL (ref <50)
Lorazepam: NEGATIVE ng/mL (ref <50)
Marijuana Metabolite: NEGATIVE ng/mL (ref <20)
Methadone Metabolite: NEGATIVE ng/mL (ref <100)
Nordiazepam: NEGATIVE ng/mL (ref <50)
Opiates: NEGATIVE ng/mL (ref <100)
Oxazepam: NEGATIVE ng/mL (ref <50)
Oxidant: NEGATIVE ug/mL (ref <200)
Oxycodone: NEGATIVE ng/mL (ref <100)
Phencyclidine: NEGATIVE ng/mL (ref <25)
Temazepam: NEGATIVE ng/mL (ref <50)
pH: 7.2 (ref 4.5–9.0)

## 2021-10-20 LAB — CBC WITH DIFFERENTIAL/PLATELET
Absolute Monocytes: 383 cells/uL (ref 200–950)
Basophils Absolute: 20 cells/uL (ref 0–200)
Basophils Relative: 0.6 %
Eosinophils Absolute: 69 cells/uL (ref 15–500)
Eosinophils Relative: 2.1 %
HCT: 41.9 % (ref 35.0–45.0)
Hemoglobin: 14.4 g/dL (ref 11.7–15.5)
Lymphs Abs: 792 cells/uL — ABNORMAL LOW (ref 850–3900)
MCH: 34.5 pg — ABNORMAL HIGH (ref 27.0–33.0)
MCHC: 34.4 g/dL (ref 32.0–36.0)
MCV: 100.5 fL — ABNORMAL HIGH (ref 80.0–100.0)
MPV: 13.2 fL — ABNORMAL HIGH (ref 7.5–12.5)
Monocytes Relative: 11.6 %
Neutro Abs: 2036 cells/uL (ref 1500–7800)
Neutrophils Relative %: 61.7 %
Platelets: 235 10*3/uL (ref 140–400)
RBC: 4.17 10*6/uL (ref 3.80–5.10)
RDW: 11 % (ref 11.0–15.0)
Total Lymphocyte: 24 %
WBC: 3.3 10*3/uL — ABNORMAL LOW (ref 3.8–10.8)

## 2021-10-20 LAB — COMPREHENSIVE METABOLIC PANEL
AG Ratio: 1.6 (calc) (ref 1.0–2.5)
ALT: 13 U/L (ref 6–29)
AST: 17 U/L (ref 10–35)
Albumin: 4.4 g/dL (ref 3.6–5.1)
Alkaline phosphatase (APISO): 50 U/L (ref 37–153)
BUN: 11 mg/dL (ref 7–25)
CO2: 28 mmol/L (ref 20–32)
Calcium: 9.9 mg/dL (ref 8.6–10.4)
Chloride: 104 mmol/L (ref 98–110)
Creat: 0.85 mg/dL (ref 0.50–1.05)
Globulin: 2.8 g/dL (calc) (ref 1.9–3.7)
Glucose, Bld: 95 mg/dL (ref 65–99)
Potassium: 3.9 mmol/L (ref 3.5–5.3)
Sodium: 139 mmol/L (ref 135–146)
Total Bilirubin: 0.6 mg/dL (ref 0.2–1.2)
Total Protein: 7.2 g/dL (ref 6.1–8.1)

## 2021-10-20 LAB — TSH: TSH: 2.76 mIU/L (ref 0.40–4.50)

## 2021-10-20 LAB — DM TEMPLATE

## 2021-10-20 LAB — LIPID PANEL
Cholesterol: 205 mg/dL — ABNORMAL HIGH (ref <200)
HDL: 70 mg/dL (ref >=50)
LDL Cholesterol (Calc): 119 mg/dL (calc) — ABNORMAL HIGH
Non-HDL Cholesterol (Calc): 135 mg/dL (calc) — ABNORMAL HIGH (ref <130)
Total CHOL/HDL Ratio: 2.9 (calc) (ref <5.0)
Triglycerides: 65 mg/dL (ref <150)

## 2021-10-22 ENCOUNTER — Telehealth: Payer: Self-pay

## 2021-10-22 NOTE — Telephone Encounter (Signed)
DWP.  °

## 2021-10-22 NOTE — Telephone Encounter (Signed)
Patient states her cholesterol has a history of being high. Her parents also had issues with their cholesterol. Patient is aware she is to have her levels checked again before her recheck but wants to be sure she shouldn't go ahead and start now. To Amy

## 2021-10-23 NOTE — Telephone Encounter (Signed)
Called patient. LMOM to return call.

## 2021-10-23 NOTE — Telephone Encounter (Signed)
Recommend dietary changes to help lower cholesterol and recheck cholesterol in 3 months. If cholesterol remains high after dietary changes, it is probably genetic and would recommend trying statin medication.

## 2021-10-25 NOTE — Telephone Encounter (Signed)
Called patient. DWP. Lab recheck scheduled.

## 2021-11-25 ENCOUNTER — Telehealth: Payer: Medicare Other | Admitting: Emergency Medicine

## 2021-11-25 DIAGNOSIS — R300 Dysuria: Secondary | ICD-10-CM

## 2021-11-25 NOTE — Progress Notes (Signed)
[place] to treat due to licensing.

## 2021-12-13 ENCOUNTER — Telehealth: Payer: Self-pay

## 2021-12-13 NOTE — Telephone Encounter (Signed)
-----   Message from Yvonna Alanis, NP sent at 10/21/2021  9:21 AM EDT ----- Regarding: Can we chaeck on this? I ordered the day she came for yearly visit. Needs to be done since she get lorazepam prescription. Please call her if not completed and schedule lab visit. I am pretty sure contract was signed.  Thanks ----- Message ----- From: SYSTEM Sent: 10/20/2021  12:17 AM EDT To: Yvonna Alanis, NP

## 2021-12-13 NOTE — Telephone Encounter (Signed)
Patient scheduled for lab appointment 01/24/2022.

## 2022-01-03 ENCOUNTER — Other Ambulatory Visit: Payer: Self-pay | Admitting: Orthopedic Surgery

## 2022-01-03 DIAGNOSIS — F419 Anxiety disorder, unspecified: Secondary | ICD-10-CM

## 2022-01-03 MED ORDER — CLONAZEPAM 1 MG PO TABS: 1.0000 mg | ORAL_TABLET | Freq: Three times a day (TID) | ORAL | 1 refills | Status: DC | PRN

## 2022-01-03 NOTE — Addendum Note (Signed)
Addended by: Rafael Bihari A on: 01/03/2022 03:34 PM   Modules accepted: Orders

## 2022-01-03 NOTE — Telephone Encounter (Signed)
Patient called and wanted to know why her Rx was Denied, stated that it was due.   Epic LR: 11/03/2021 with one RF  I verified with the pharmacist at [facility] the refill dates: 11/04/21 12/05/21  Gave Verbal to pharmacist.

## 2022-01-24 ENCOUNTER — Other Ambulatory Visit: Payer: Medicare Other

## 2022-01-24 ENCOUNTER — Other Ambulatory Visit: Payer: Self-pay

## 2022-01-24 ENCOUNTER — Other Ambulatory Visit: Payer: Self-pay | Admitting: Orthopedic Surgery

## 2022-01-24 DIAGNOSIS — I10 Essential (primary) hypertension: Secondary | ICD-10-CM

## 2022-01-24 DIAGNOSIS — E039 Hypothyroidism, unspecified: Secondary | ICD-10-CM | POA: Diagnosis not present

## 2022-01-24 DIAGNOSIS — E785 Hyperlipidemia, unspecified: Secondary | ICD-10-CM | POA: Diagnosis not present

## 2022-01-25 LAB — LIPID PANEL
Cholesterol: 200 mg/dL — ABNORMAL HIGH (ref <200)
HDL: 63 mg/dL (ref >=50)
LDL Cholesterol (Calc): 118 mg/dL (calc) — ABNORMAL HIGH
Non-HDL Cholesterol (Calc): 137 mg/dL (calc) — ABNORMAL HIGH (ref <130)
Total CHOL/HDL Ratio: 3.2 (calc) (ref <5.0)
Triglycerides: 89 mg/dL (ref <150)

## 2022-01-25 LAB — CBC WITH DIFFERENTIAL/PLATELET
Absolute Monocytes: 345 cells/uL (ref 200–950)
Basophils Absolute: 20 cells/uL (ref 0–200)
Basophils Relative: 0.7 %
Eosinophils Absolute: 41 cells/uL (ref 15–500)
Eosinophils Relative: 1.4 %
HCT: 45.3 % — ABNORMAL HIGH (ref 35.0–45.0)
Hemoglobin: 15.2 g/dL (ref 11.7–15.5)
Lymphs Abs: 603 cells/uL — ABNORMAL LOW (ref 850–3900)
MCH: 33.3 pg — ABNORMAL HIGH (ref 27.0–33.0)
MCHC: 33.6 g/dL (ref 32.0–36.0)
MCV: 99.3 fL (ref 80.0–100.0)
MPV: 12.5 fL (ref 7.5–12.5)
Monocytes Relative: 11.9 %
Neutro Abs: 1891 cells/uL (ref 1500–7800)
Neutrophils Relative %: 65.2 %
Platelets: 230 10*3/uL (ref 140–400)
RBC: 4.56 10*6/uL (ref 3.80–5.10)
RDW: 10.7 % — ABNORMAL LOW (ref 11.0–15.0)
Total Lymphocyte: 20.8 %
WBC: 2.9 10*3/uL — ABNORMAL LOW (ref 3.8–10.8)

## 2022-01-25 LAB — COMPREHENSIVE METABOLIC PANEL
AG Ratio: 1.7 (calc) (ref 1.0–2.5)
ALT: 13 U/L (ref 6–29)
AST: 17 U/L (ref 10–35)
Albumin: 4.5 g/dL (ref 3.6–5.1)
Alkaline phosphatase (APISO): 42 U/L (ref 37–153)
BUN: 15 mg/dL (ref 7–25)
CO2: 25 mmol/L (ref 20–32)
Calcium: 9.5 mg/dL (ref 8.6–10.4)
Chloride: 104 mmol/L (ref 98–110)
Creat: 0.66 mg/dL (ref 0.60–1.00)
Globulin: 2.6 g/dL (calc) (ref 1.9–3.7)
Glucose, Bld: 96 mg/dL (ref 65–99)
Potassium: 4.6 mmol/L (ref 3.5–5.3)
Sodium: 136 mmol/L (ref 135–146)
Total Bilirubin: 0.6 mg/dL (ref 0.2–1.2)
Total Protein: 7.1 g/dL (ref 6.1–8.1)

## 2022-01-25 LAB — TSH: TSH: 0.21 mIU/L — ABNORMAL LOW (ref 0.40–4.50)

## 2022-01-28 ENCOUNTER — Other Ambulatory Visit: Payer: Self-pay | Admitting: Orthopedic Surgery

## 2022-01-28 ENCOUNTER — Other Ambulatory Visit: Payer: Self-pay

## 2022-01-28 DIAGNOSIS — D709 Neutropenia, unspecified: Secondary | ICD-10-CM

## 2022-01-28 DIAGNOSIS — E039 Hypothyroidism, unspecified: Secondary | ICD-10-CM

## 2022-01-28 MED ORDER — THYROID 30 MG PO TABS: ORAL_TABLET | ORAL | 5 refills | Status: DC

## 2022-01-28 MED ORDER — THYROID 60 MG PO TABS: ORAL_TABLET | ORAL | 30 refills | Status: DC

## 2022-01-28 NOTE — Progress Notes (Signed)
Thank you  for update

## 2022-02-06 ENCOUNTER — Other Ambulatory Visit: Payer: Self-pay | Admitting: Orthopedic Surgery

## 2022-02-07 ENCOUNTER — Other Ambulatory Visit: Payer: Self-pay

## 2022-02-07 ENCOUNTER — Other Ambulatory Visit: Payer: Medicare Other

## 2022-02-07 DIAGNOSIS — D709 Neutropenia, unspecified: Secondary | ICD-10-CM | POA: Diagnosis not present

## 2022-02-08 LAB — CBC WITH DIFFERENTIAL/PLATELET
Absolute Monocytes: 362 cells/uL (ref 200–950)
Basophils Absolute: 19 cells/uL (ref 0–200)
Basophils Relative: 0.6 %
Eosinophils Absolute: 61 cells/uL (ref 15–500)
Eosinophils Relative: 1.9 %
HCT: 44.7 % (ref 35.0–45.0)
Hemoglobin: 15.1 g/dL (ref 11.7–15.5)
Lymphs Abs: 746 cells/uL — ABNORMAL LOW (ref 850–3900)
MCH: 33.6 pg — ABNORMAL HIGH (ref 27.0–33.0)
MCHC: 33.8 g/dL (ref 32.0–36.0)
MCV: 99.3 fL (ref 80.0–100.0)
MPV: 12.6 fL — ABNORMAL HIGH (ref 7.5–12.5)
Monocytes Relative: 11.3 %
Neutro Abs: 2013 cells/uL (ref 1500–7800)
Neutrophils Relative %: 62.9 %
Platelets: 242 10*3/uL (ref 140–400)
RBC: 4.5 10*6/uL (ref 3.80–5.10)
RDW: 10.7 % — ABNORMAL LOW (ref 11.0–15.0)
Total Lymphocyte: 23.3 %
WBC: 3.2 10*3/uL — ABNORMAL LOW (ref 3.8–10.8)

## 2022-02-08 LAB — VITAMIN B12: Vitamin B-12: 664 pg/mL (ref 200–1100)

## 2022-02-08 LAB — FOLATE: Folate: 22.4 ng/mL

## 2022-02-10 ENCOUNTER — Other Ambulatory Visit: Payer: Self-pay | Admitting: Orthopedic Surgery

## 2022-02-10 DIAGNOSIS — D72819 Decreased white blood cell count, unspecified: Secondary | ICD-10-CM

## 2022-02-11 ENCOUNTER — Telehealth: Payer: Self-pay | Admitting: Hematology and Oncology

## 2022-02-11 NOTE — Telephone Encounter (Signed)
Scheduled  appt per 2/13 referral. Pt is aware of appt date and time. Pt is aware to arrive 15 mins prior to appt time.

## 2022-03-03 ENCOUNTER — Encounter: Payer: Self-pay | Admitting: Hematology and Oncology

## 2022-03-03 ENCOUNTER — Inpatient Hospital Stay: Payer: Medicare Other | Attending: Hematology and Oncology | Admitting: Hematology and Oncology

## 2022-03-03 ENCOUNTER — Telehealth: Payer: Self-pay

## 2022-03-03 ENCOUNTER — Other Ambulatory Visit: Payer: Self-pay | Admitting: Orthopedic Surgery

## 2022-03-03 ENCOUNTER — Other Ambulatory Visit: Payer: Self-pay

## 2022-03-03 DIAGNOSIS — D72819 Decreased white blood cell count, unspecified: Secondary | ICD-10-CM

## 2022-03-03 DIAGNOSIS — E039 Hypothyroidism, unspecified: Secondary | ICD-10-CM | POA: Insufficient documentation

## 2022-03-03 DIAGNOSIS — F419 Anxiety disorder, unspecified: Secondary | ICD-10-CM

## 2022-03-03 DIAGNOSIS — Z79899 Other long term (current) drug therapy: Secondary | ICD-10-CM | POA: Diagnosis not present

## 2022-03-03 NOTE — Telephone Encounter (Signed)
Patient has request refill on medication "Clonazepam '1mg'$ ". Patient last refill on medication dated 01/03/2022. Patient has Non Opioid Contract on file dated 10/15/2021. Medication pend and sent to PCP Yvonna Alanis, NP for approval.  ?

## 2022-03-03 NOTE — Assessment & Plan Note (Signed)
She has chronic intermittent leukopenia for many years, at least since 2014 ?The pattern is not consistent with malignancy ?Given her history of hypothyroidism, it could be related to autoimmune phenomenon ?It could also be related to her recent alcohol intake ?I recommend delaying her blood draw until Friday, approximately 10 days from her last alcohol use ?I will see her back with repeat blood work and evaluation ?I do not believe her history of recurrent UTIs related to this ?

## 2022-03-03 NOTE — Telephone Encounter (Signed)
Returned her call and left a message asking her to call the office back. She said that she has a question. Reminded that she has appt at 2 pm today with Dr. Alvy Bimler. ?

## 2022-03-03 NOTE — Progress Notes (Signed)
[facility] NOTE  Patient Care Team: Yvonna Alanis, NP as PCP - General (Adult Health Nurse Practitioner) [place], Heather, OD (Optometry)  CHIEF COMPLAINTS/PURPOSE OF CONSULTATION:  Chronic leukopenia  HISTORY OF PRESENTING ILLNESS:  Donna Ware 71 y.o. female is here because of chronic intermittent leukopenia. 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 The last prescription antibiotics was more than 3 months ago There is not reported symptoms of sinus congestion, cough, urinary frequency/urgency or dysuria, diarrhea, joint swelling/pain or abnormal skin rash.  She had no prior history or diagnosis of cancer. Her age appropriate screening programs are up-to-date. The patient has no prior diagnosis of autoimmune disease and was not prescribed corticosteroids related products. The patient drink 2 to 3 units of alcohol once a week usually on Tuesdays, her last drink was 6 days ago  MEDICAL HISTORY:  Past Medical History:  Diagnosis Date   Anxiety state, unspecified    Barrett's esophagus    Cystocele, midline    Depressive disorder, not elsewhere classified    Encounter for long-term (current) use of other medications    Glycosuria    Insomnia, unspecified    Obstructive sleep apnea (adult) (pediatric)    Oral aphthae    Other and unspecified hyperlipidemia    Proteinuria    Reflux esophagitis    Rosacea    Senile osteoporosis    STD (sexually transmitted disease)    HSV2   Unspecified  essential hypertension    Unspecified hypothyroidism    Viral hepatitis A without mention of hepatic coma     SURGICAL HISTORY: Past Surgical History:  Procedure Laterality Date   BREAST ENHANCEMENT SURGERY  2010   COSMETIC SURGERY  2009   for protruding ears   GANGLION CYST EXCISION  2011   right palm   teeth implants     tooth implant  [id number]   tummy tuck      SOCIAL HISTORY: Social History   Socioeconomic History   Marital status: Legally Separated    Spouse name: Not on file   Number of children: Not on file   Years of education: Not on file   Highest education level: Not on file  Occupational History   Not on file  Tobacco Use   Smoking status: Never   Smokeless tobacco: Never  Vaping Use   Vaping Use: Never used  Substance and Sexual Activity   Alcohol use: Not Currently   Drug use: No   Sexual activity: Not on file  Other Topics Concern   Not on file  Social History Narrative   Not on file   Social Determinants of Health   Financial Resource Strain: Not on file  Food Insecurity: Not on file  Transportation Needs: Not on file  Physical Activity: Not on file  Stress: Not on file  Social Connections: Not on file  Intimate Partner Violence: Not on file    FAMILY HISTORY: Family History  Problem Relation Age of Onset   Heart attack  Father    Heart attack Paternal Grandfather    Heart disease Brother    Breast cancer Mother     ALLERGIES:  is allergic to adhesive [tape], lactose intolerance (gi), and sulfa antibiotics.  MEDICATIONS:  Current Outpatient Medications  Medication Sig Dispense Refill   b complex vitamins capsule Take 1 capsule by mouth daily.     omeprazole (PRILOSEC) 20 MG capsule Take 20 mg by mouth daily.     clonazePAM (KLONOPIN) 1 MG tablet TAKE 1 TABLET BY MOUTH THREE TIMES DAILY AS NEEDED FOR ANXIETY 90 tablet 1   estradiol (ESTRACE) 0.1 MG/GM vaginal cream PLACE 0.5(1/2) GRAM VAGINALLY 2 TIMES A WEEK AT BEDTIME 42.5 g 1    Ibuprofen 200 MG CAPS Take 200 mg by mouth daily as needed.     loratadine-pseudoephedrine (CLARITIN-D 12 HOUR) 5-120 MG tablet Take 1 tablet by mouth 2 (two) times daily as needed.      MELATONIN PO Take 10 mg by mouth every evening.     Multiple Vitamins-Minerals (CENTRUM ADULTS) TABS Take 1 tablet by mouth daily.     Probiotic Product (PROBIOTIC DAILY PO) Take by mouth daily.     thyroid (ARMOUR THYROID) 60 MG tablet TAKE 1 TABLET BY MOUTH EVERY DAY FOR THYROID 30 tablet 30   No current facility-administered medications for this visit.    REVIEW OF SYSTEMS:   Constitutional: Denies fevers, chills or abnormal night sweats Eyes: Denies blurriness of vision, double vision or watery eyes Ears, nose, mouth, throat, and face: Denies mucositis or sore throat Respiratory: Denies cough, dyspnea or wheezes Cardiovascular: Denies palpitation, chest discomfort or lower extremity swelling Gastrointestinal:  Denies nausea, heartburn or change in bowel habits Skin: Denies abnormal skin rashes Lymphatics: Denies new lymphadenopathy or easy bruising Neurological:Denies numbness, tingling or new weaknesses Behavioral/Psych: Mood is stable, no new changes  All other systems were reviewed with the patient and are negative.  PHYSICAL EXAMINATION: ECOG PERFORMANCE STATUS: 0 - Asymptomatic  Vitals:   03/03/22 1415  BP: 127/72  Pulse: 92  Resp: 18  Temp: 98.8 F (37.1 C)  SpO2: 100%   Filed Weights   03/03/22 1415  Weight: 139 lb 6.4 oz (63.2 kg)    GENERAL:alert, no distress and comfortable SKIN: skin color, texture, turgor are normal, no rashes or significant lesions EYES: normal, conjunctiva are pink and non-injected, sclera clear OROPHARYNX:no exudate, no erythema and lips, buccal mucosa, and tongue normal  NECK: supple, thyroid normal size, non-tender, without nodularity LYMPH:  no palpable lymphadenopathy in the cervical, axillary or inguinal LUNGS: clear to auscultation and percussion  with normal breathing effort HEART: regular rate & rhythm and no murmurs and no lower extremity edema ABDOMEN:abdomen soft, non-tender and normal bowel sounds Musculoskeletal:no cyanosis of digits and no clubbing  PSYCH: alert & oriented x 3 with fluent speech NEURO: no focal motor/sensory deficits  LABORATORY DATA:  I have reviewed the data as listed Recent Results (from the past 2160 hour(s))  CBC with Differential/Platelet     Status: Abnormal   Collection Time: 01/24/22  2:34 PM  Result Value Ref Range   WBC 2.9 (L) 3.8 - 10.8 Thousand/uL   RBC 4.56 3.80 - 5.10 Million/uL   Hemoglobin 15.2 11.7 - 15.5 g/dL   HCT 45.3 (H) 35.0 - 45.0 %   MCV 99.3 80.0 - 100.0 fL   MCH 33.3 (H) 27.0 - 33.0 pg   MCHC 33.6 32.0 - 36.0 g/dL   RDW 10.7 (L) 11.0 - 15.0 %  Platelets 230 140 - 400 Thousand/uL   MPV 12.5 7.5 - 12.5 fL   Neutro Abs 1,891 1,500 - 7,800 cells/uL   Lymphs Abs 603 (L) 850 - 3,900 cells/uL   Absolute Monocytes 345 200 - 950 cells/uL   Eosinophils Absolute 41 15 - 500 cells/uL   Basophils Absolute 20 0 - 200 cells/uL   Neutrophils Relative % 65.2 %   Total Lymphocyte 20.8 %   Monocytes Relative 11.9 %   Eosinophils Relative 1.4 %   Basophils Relative 0.7 %  CMP     Status: None   Collection Time: 01/24/22  2:34 PM  Result Value Ref Range   Glucose, Bld 96 65 - 99 mg/dL    Comment: .            Fasting reference interval .    BUN 15 7 - 25 mg/dL   Creat 0.66 0.60 - 1.00 mg/dL   BUN/Creatinine Ratio NOT APPLICABLE 6 - 22 (calc)   Sodium 136 135 - 146 mmol/L   Potassium 4.6 3.5 - 5.3 mmol/L   Chloride 104 98 - 110 mmol/L   CO2 25 20 - 32 mmol/L   Calcium 9.5 8.6 - 10.4 mg/dL   Total Protein 7.1 6.1 - 8.1 g/dL   Albumin 4.5 3.6 - 5.1 g/dL   Globulin 2.6 1.9 - 3.7 g/dL (calc)   AG Ratio 1.7 1.0 - 2.5 (calc)   Total Bilirubin 0.6 0.2 - 1.2 mg/dL   Alkaline phosphatase (APISO) 42 37 - 153 U/L   AST 17 10 - 35 U/L   ALT 13 6 - 29 U/L  TSH     Status: Abnormal    Collection Time: 01/24/22  2:34 PM  Result Value Ref Range   TSH 0.21 (L) 0.40 - 4.50 mIU/L  Lipid Panel     Status: Abnormal   Collection Time: 01/24/22  2:34 PM  Result Value Ref Range   Cholesterol 200 (H) <200 mg/dL   HDL 63 > OR = 50 mg/dL   Triglycerides 89 <150 mg/dL   LDL Cholesterol (Calc) 118 (H) mg/dL (calc)    Comment: Reference range: <100 . Desirable range <100 mg/dL for primary prevention;   <70 mg/dL for patients with CHD or diabetic patients  with > or = 2 CHD risk factors. Marland Kitchen LDL-C is now calculated using the Martin-Hopkins  calculation, which is a validated novel method providing  better accuracy than the Friedewald equation in the  estimation of LDL-C.  Cresenciano Genre et al. Annamaria Helling. 6045;409(81): 2061-2068  (http://education.[employer].com/faq/FAQ164)    Total CHOL/HDL Ratio 3.2 <5.0 (calc)   Non-HDL Cholesterol (Calc) 137 (H) <130 mg/dL (calc)    Comment: For patients with diabetes plus 1 major ASCVD risk  factor, treating to a non-HDL-C goal of <100 mg/dL  (LDL-C of <70 mg/dL) is considered a therapeutic  option.   CBC with Differential/Platelet     Status: Abnormal   Collection Time: 02/07/22  2:41 PM  Result Value Ref Range   WBC 3.2 (L) 3.8 - 10.8 Thousand/uL   RBC 4.50 3.80 - 5.10 Million/uL   Hemoglobin 15.1 11.7 - 15.5 g/dL   HCT 44.7 35.0 - 45.0 %   MCV 99.3 80.0 - 100.0 fL   MCH 33.6 (H) 27.0 - 33.0 pg   MCHC 33.8 32.0 - 36.0 g/dL   RDW 10.7 (L) 11.0 - 15.0 %   Platelets 242 140 - 400 Thousand/uL   MPV 12.6 (H) 7.5 - 12.5 fL  Neutro Abs 2,013 1,500 - 7,800 cells/uL   Lymphs Abs 746 (L) 850 - 3,900 cells/uL   Absolute Monocytes 362 200 - 950 cells/uL   Eosinophils Absolute 61 15 - 500 cells/uL   Basophils Absolute 19 0 - 200 cells/uL   Neutrophils Relative % 62.9 %   Total Lymphocyte 23.3 %   Monocytes Relative 11.3 %   Eosinophils Relative 1.9 %   Basophils Relative 0.6 %  Folate     Status: None   Collection Time: 02/07/22  2:41 PM   Result Value Ref Range   Folate 22.4 ng/mL    Comment:                            Reference Range                            Low:           <3.4                            Borderline:    3.4-5.4                            Normal:        >5.4 .   Vitamin B12     Status: None   Collection Time: 02/07/22  2:41 PM  Result Value Ref Range   Vitamin B-12 664 200 - 1,100 pg/mL    ASSESSMENT & PLAN  Chronic leukopenia She has chronic intermittent leukopenia for many years, at least since 2014 The pattern is not consistent with malignancy Given her history of hypothyroidism, it could be related to autoimmune phenomenon It could also be related to her recent alcohol intake I recommend delaying her blood draw until Friday, approximately 10 days from her last alcohol use I will see her back with repeat blood work and evaluation I do not believe her history of recurrent UTIs related to this  Orders Placed This Encounter  Procedures   CBC with Differential ([facility] Only)    Standing Status:   Future    Standing Expiration Date:   03/04/2023   Sedimentation rate    Standing Status:   Future    Standing Expiration Date:   03/04/2023   Save Smear (SSMR)    Standing Status:   Future    Standing Expiration Date:   03/04/2023   ANA, IFA (with reflex)    Standing Status:   Future    Standing Expiration Date:   03/03/2023    All questions were answered. The patient knows to call the clinic with any problems, questions or concerns. I spent 55 minutes counseling the patient face to face. The total time spent in the appointment was 55 minutes and more than 50% was on counseling.     Heath Lark, MD 03/03/2022 3:46 PM

## 2022-03-07 ENCOUNTER — Inpatient Hospital Stay: Payer: Medicare Other

## 2022-03-07 ENCOUNTER — Other Ambulatory Visit: Payer: Medicare Other

## 2022-03-07 ENCOUNTER — Encounter: Payer: Self-pay | Admitting: Hematology and Oncology

## 2022-03-07 ENCOUNTER — Other Ambulatory Visit: Payer: Self-pay

## 2022-03-07 ENCOUNTER — Inpatient Hospital Stay (HOSPITAL_BASED_OUTPATIENT_CLINIC_OR_DEPARTMENT_OTHER): Payer: Medicare Other | Admitting: Hematology and Oncology

## 2022-03-07 DIAGNOSIS — D72819 Decreased white blood cell count, unspecified: Secondary | ICD-10-CM

## 2022-03-07 DIAGNOSIS — E039 Hypothyroidism, unspecified: Secondary | ICD-10-CM

## 2022-03-07 DIAGNOSIS — Z79899 Other long term (current) drug therapy: Secondary | ICD-10-CM | POA: Diagnosis not present

## 2022-03-07 LAB — CBC WITH DIFFERENTIAL (CANCER CENTER ONLY)
Abs Immature Granulocytes: 0 10*3/uL (ref 0.00–0.07)
Basophils Absolute: 0 10*3/uL (ref 0.0–0.1)
Basophils Relative: 1 %
Eosinophils Absolute: 0.1 10*3/uL (ref 0.0–0.5)
Eosinophils Relative: 3 %
HCT: 44.7 % (ref 36.0–46.0)
Hemoglobin: 15.3 g/dL — ABNORMAL HIGH (ref 12.0–15.0)
Immature Granulocytes: 0 %
Lymphocytes Relative: 43 %
Lymphs Abs: 1.6 10*3/uL (ref 0.7–4.0)
MCH: 33.6 pg (ref 26.0–34.0)
MCHC: 34.2 g/dL (ref 30.0–36.0)
MCV: 98 fL (ref 80.0–100.0)
Monocytes Absolute: 0.4 10*3/uL (ref 0.1–1.0)
Monocytes Relative: 11 %
Neutro Abs: 1.5 10*3/uL — ABNORMAL LOW (ref 1.7–7.7)
Neutrophils Relative %: 42 %
Platelet Count: 239 10*3/uL (ref 150–400)
RBC: 4.56 MIL/uL (ref 3.87–5.11)
RDW: 11.6 % (ref 11.5–15.5)
WBC Count: 3.6 10*3/uL — ABNORMAL LOW (ref 4.0–10.5)
nRBC: 0 % (ref 0.0–0.2)

## 2022-03-07 LAB — SAVE SMEAR(SSMR), FOR PROVIDER SLIDE REVIEW

## 2022-03-07 LAB — SEDIMENTATION RATE: Sed Rate: 1 mm/hr (ref 0–22)

## 2022-03-07 NOTE — Assessment & Plan Note (Signed)
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 ?

## 2022-03-07 NOTE — Progress Notes (Signed)
? ?[facility] ?OFFICE PROGRESS NOTE ? ?Donna Alanis, NP ? 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 ? ?No orders of the defined types were placed in this encounter. ? ? ?The total time spent in the appointment was 20 minutes encounter with patients including review of chart and various tests results, discussions about plan of care and coordination of care plan ? ? All questions were answered. The patient knows to call the clinic with any problems, questions or concerns. No barriers to learning was detected. ? ? ? Donna Lark, MD ?3/10/20231:37 PM ? ?INTERVAL HISTORY: ?Donna Ware 71 y.o. female returns for follow-up and review of test results for leukopenia ?She brought a copy of her blood test from 2004 and her white blood cell count was 4.1 then ?She has not drink any alcohol for 10 days ? ?SUMMARY OF HEMATOLOGIC HISTORY: ?Donna Ware 71 y.o. female is here because of chronic intermittent leukopenia. 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 ?She was found to have abnormal CBC from blood work ?I have the opportunity to review her CBC dated back to June 09, 2013 ?The trend of her white blood cell count ranged from 2.9-5.1 ?Her most recent evaluation on February 10 included vitamin B12 and folate level which were within normal range ?She is noted to have mild intermittent abnormal TSH due to hypothyroidism ?She have history of recurrent UTI in the past ? ?She denies  recent infection. The last prescription antibiotics was more than 3 months ago ?There is not reported symptoms of sinus congestion, cough, urinary frequency/urgency or dysuria, diarrhea, joint swelling/pain or abnormal skin rash.  ?She had no prior history or diagnosis of cancer. Her age appropriate screening programs are up-to-date. ?The patient has no prior diagnosis of autoimmune disease and was not prescribed corticosteroids related products. ?The patient drink 2 to 3 units of alcohol once a week usually on Tuesdays ? ?I have reviewed the past medical history, past surgical history, social history and family history with the patient and they are unchanged from previous note. ? ?ALLERGIES:  is allergic to adhesive [tape], lactose intolerance (gi), and sulfa antibiotics. ? ?MEDICATIONS:  ?Current Outpatient Medications  ?Medication Sig Dispense Refill  ? Cranberry 450 MG CAPS Take 450 mg by mouth in the morning.    ? Denosumab (PROLIA [place]) Inject into the skin once. Every 2 years , not sure of the dosage.    ? docusate sodium (COLACE) 100 MG capsule Take 100 mg by mouth daily as needed for mild constipation. 1 to 4 capsules prn constipation    ? doxylamine, Sleep, (UNISOM) 25 MG tablet Take 50 mg by mouth at bedtime as needed.    ? UNABLE TO FIND Take 18,000 capsules by mouth as needed. Med Name: Lactace enzyme 2 by mouth prn    ? b complex vitamins capsule Take 1 capsule by mouth daily.    ? clonazePAM (KLONOPIN) 1 MG tablet TAKE 1 TABLET BY MOUTH THREE TIMES DAILY AS  NEEDED FOR ANXIETY 90 tablet 1  ? estradiol (ESTRACE) 0.1 MG/GM vaginal cream PLACE 0.5(1/2) GRAM VAGINALLY 2 TIMES A WEEK AT BEDTIME 42.5 g 1  ? Ibuprofen 200 MG CAPS Take 200 mg by mouth daily as needed.    ? loratadine-pseudoephedrine (CLARITIN-D 12 HOUR) 5-120 MG tablet Take 1 tablet by mouth 2 (two) times daily as needed.     ? Multiple Vitamins-Minerals (CENTRUM ADULTS) TABS Take 1 tablet by mouth daily.    ? omeprazole (PRILOSEC) 20 MG capsule  Take 20 mg by mouth daily.    ? Probiotic Product (PROBIOTIC DAILY PO) Take by mouth daily.    ? thyroid (ARMOUR THYROID) 60 MG tablet TAKE 1 TABLET BY MOUTH EVERY DAY FOR THYROID 30 tablet 30  ? ?No current facility-administered medications for this visit.  ?  ? ?REVIEW OF SYSTEMS:   ?Constitutional: Denies fevers, chills or night sweats ?Eyes: Denies blurriness of vision ?Ears, nose, mouth, throat, and face: Denies mucositis or sore throat ?Respiratory: Denies cough, dyspnea or wheezes ?Cardiovascular: Denies palpitation, chest discomfort or lower extremity swelling ?Gastrointestinal:  Denies nausea, heartburn or change in bowel habits ?Skin: Denies abnormal skin rashes ?Lymphatics: Denies new lymphadenopathy or easy bruising ?Neurological:Denies numbness, tingling or new weaknesses ?Behavioral/Psych: Mood is stable, no new changes  ?All other systems were reviewed with the patient and are negative. ? ?PHYSICAL EXAMINATION: ?ECOG PERFORMANCE STATUS: 0 - Asymptomatic ? ?Vitals:  ? 03/07/22 0954  ?BP: 139/84  ?Pulse: 67  ?Resp: 18  ?Temp: (!) 97.4 ?F (36.3 ?C)  ?SpO2: 100%  ? ?Filed Weights  ? 03/07/22 0954  ?Weight: 136 lb 3.2 oz (61.8 kg)  ? ? ?GENERAL:alert, no distress and comfortable ?LABORATORY DATA:  ?I have reviewed the data as listed ? ?   ?Component Value Date/Time  ? NA 136 01/24/2022 1434  ? NA 141 06/13/2016 1122  ? K 4.6 01/24/2022 1434  ? CL 104 01/24/2022 1434  ? CO2 25 01/24/2022 1434  ? GLUCOSE 96 01/24/2022 1434  ? BUN 15 01/24/2022 1434  ? BUN 19 06/13/2016 1122  ? CREATININE 0.66 01/24/2022 1434  ? CALCIUM 9.5 01/24/2022 1434  ? PROT 7.1 01/24/2022 1434  ? PROT 6.8 06/13/2016 1122  ? ALBUMIN 4.4 06/18/2017 1500  ? ALBUMIN 4.6 06/13/2016 1122  ? AST 17 01/24/2022 1434  ? ALT 13 01/24/2022 1434  ? ALKPHOS 42 06/18/2017 1500  ? BILITOT 0.6 01/24/2022 1434  ? BILITOT 0.8 06/13/2016 1122  ? GFRNONAA 85 09/20/2020 1509  ? GFRAA 98 09/20/2020 1509  ? ? ?No results found for: SPEP, UPEP ? ?Lab Results   ?Component Value Date  ? WBC 3.6 (L) 03/07/2022  ? NEUTROABS 1.5 (L) 03/07/2022  ? HGB 15.3 (H) 03/07/2022  ? HCT 44.7 03/07/2022  ? MCV 98.0 03/07/2022  ? PLT 239 03/07/2022  ? ? ?  Chemistry   ?   ?Component Value Date/Time  ? NA 136 01/24/2022 1434  ? NA 141 06/13/2016 1122  ? K 4.6 01/24/2022 1434  ? CL 104 01/24/2022 1434  ? CO2 25 01/24/2022 1434  ? BUN 15 01/24/2022 1434  ? BUN 19 06/13/2016 1122  ? CREATININE 0.66 01/24/2022 1434  ?    ?Component Value Date/Time  ? CALCIUM 9.5 01/24/2022 1434  ? ALKPHOS 42 06/18/2017 1500  ? AST 17 01/24/2022 1434  ? ALT 13 01/24/2022 1434  ? BILITOT 0.6 01/24/2022 1434  ? BILITOT 0.8 06/13/2016 1122  ?  ? ? ?

## 2022-03-13 LAB — FANA STAINING PATTERNS: Speckled Pattern: 24529

## 2022-03-13 LAB — ANTINUCLEAR ANTIBODIES, IFA: ANA Ab, IFA: POSITIVE — AB

## 2022-03-17 ENCOUNTER — Encounter: Payer: Self-pay | Admitting: Hematology and Oncology

## 2022-03-19 ENCOUNTER — Telehealth: Payer: Self-pay | Admitting: Hematology and Oncology

## 2022-03-19 NOTE — Telephone Encounter (Signed)
I was able to get hold of the patient and review the meaning of positive ANA screen ?We discussed risk and benefits of rheumatology referral versus observation in 6 months ?She would like to discuss this recommendation with family and will get back to me ?I have addressed all her questions ?

## 2022-03-22 ENCOUNTER — Encounter: Payer: Self-pay | Admitting: Hematology and Oncology

## 2022-03-24 ENCOUNTER — Telehealth: Payer: Self-pay

## 2022-03-24 ENCOUNTER — Other Ambulatory Visit: Payer: Self-pay

## 2022-03-24 ENCOUNTER — Other Ambulatory Visit: Payer: Self-pay | Admitting: Hematology and Oncology

## 2022-03-24 DIAGNOSIS — R768 Other specified abnormal immunological findings in serum: Secondary | ICD-10-CM | POA: Insufficient documentation

## 2022-03-24 NOTE — Telephone Encounter (Signed)
Faxed referral to Dr. Amil Amen, rheumatology at [phone]. Received confirmation. ?

## 2022-04-03 ENCOUNTER — Encounter: Payer: Self-pay | Admitting: Physician Assistant

## 2022-04-09 ENCOUNTER — Telehealth: Payer: Self-pay | Admitting: Medical Oncology

## 2022-04-09 NOTE — Telephone Encounter (Signed)
Referral declined-Letter received from [facility] that "..based on the medical information provided , our provider has declined the referral  for Donna Ware". ? ?Letter faxed to Dr. Calton Dach staff. ?

## 2022-04-11 ENCOUNTER — Telehealth: Payer: Self-pay

## 2022-04-11 NOTE — Telephone Encounter (Addendum)
Called and left a message asking her to call the office back. [facility] declined the referral due to a high volume of referrals. ?Dr. Alvy Bimler is happy to send a referral to another provider. ?

## 2022-04-11 NOTE — Telephone Encounter (Signed)
She called back and left a message. She would like a referral to [facility]. She appreciated the call. ?

## 2022-04-14 ENCOUNTER — Ambulatory Visit (INDEPENDENT_AMBULATORY_CARE_PROVIDER_SITE_OTHER): Payer: Medicare Other | Admitting: *Deleted

## 2022-04-14 DIAGNOSIS — M810 Age-related osteoporosis without current pathological fracture: Secondary | ICD-10-CM

## 2022-04-14 MED ORDER — DENOSUMAB 60 MG/ML ~~LOC~~ SOSY
60.0000 mg | PREFILLED_SYRINGE | Freq: Once | SUBCUTANEOUS | Status: AC
  Administered 2022-04-14: 60 mg via SUBCUTANEOUS

## 2022-04-14 NOTE — Telephone Encounter (Signed)
Please send referrals ?

## 2022-04-14 NOTE — Telephone Encounter (Signed)
Faxed referral to [facility] at [phone], received confirmation. ?

## 2022-04-17 ENCOUNTER — Ambulatory Visit: Payer: Medicare Other | Admitting: Physician Assistant

## 2022-05-02 ENCOUNTER — Other Ambulatory Visit: Payer: Self-pay | Admitting: Orthopedic Surgery

## 2022-05-02 DIAGNOSIS — F419 Anxiety disorder, unspecified: Secondary | ICD-10-CM

## 2022-05-02 NOTE — Telephone Encounter (Signed)
Rx last refill (03/03/22), 90/1 refill. Treatment agreement on file 10/10/21. Upcoming appointment on (10/16/22).  ? ?Please advise.  ? ?

## 2022-05-22 DIAGNOSIS — Z1159 Encounter for screening for other viral diseases: Secondary | ICD-10-CM | POA: Diagnosis not present

## 2022-05-22 DIAGNOSIS — F32A Depression, unspecified: Secondary | ICD-10-CM | POA: Insufficient documentation

## 2022-05-22 DIAGNOSIS — D759 Disease of blood and blood-forming organs, unspecified: Secondary | ICD-10-CM | POA: Diagnosis not present

## 2022-05-22 DIAGNOSIS — E038 Other specified hypothyroidism: Secondary | ICD-10-CM | POA: Diagnosis not present

## 2022-05-22 DIAGNOSIS — R768 Other specified abnormal immunological findings in serum: Secondary | ICD-10-CM | POA: Diagnosis not present

## 2022-05-22 DIAGNOSIS — M1990 Unspecified osteoarthritis, unspecified site: Secondary | ICD-10-CM | POA: Diagnosis not present

## 2022-05-22 HISTORY — DX: Depression, unspecified: F32.A

## 2022-06-02 ENCOUNTER — Other Ambulatory Visit: Payer: Self-pay | Admitting: Orthopedic Surgery

## 2022-06-02 DIAGNOSIS — F419 Anxiety disorder, unspecified: Secondary | ICD-10-CM

## 2022-06-02 NOTE — Telephone Encounter (Signed)
Patient has request refill on medication Clonazepam. Patient medication last refilled 05/02/2022. Patient has Non Opioid Contract signed 10/15/2021. Patient medication pend and sent to PCP Yvonna Alanis, NP for approval. Please Advise.

## 2022-07-02 ENCOUNTER — Other Ambulatory Visit: Payer: Self-pay | Admitting: Orthopedic Surgery

## 2022-07-02 DIAGNOSIS — F419 Anxiety disorder, unspecified: Secondary | ICD-10-CM

## 2022-07-02 NOTE — Telephone Encounter (Signed)
Rx last refill (06/02/22), 90/0 refills. No treatment agreement on file. Updated upcoming appointment note to sign contract. Upcoming appointment on (10/16/22).   Please advise.

## 2022-07-16 DIAGNOSIS — L570 Actinic keratosis: Secondary | ICD-10-CM | POA: Diagnosis not present

## 2022-07-16 DIAGNOSIS — L738 Other specified follicular disorders: Secondary | ICD-10-CM | POA: Diagnosis not present

## 2022-07-16 DIAGNOSIS — D1801 Hemangioma of skin and subcutaneous tissue: Secondary | ICD-10-CM | POA: Diagnosis not present

## 2022-07-16 DIAGNOSIS — L821 Other seborrheic keratosis: Secondary | ICD-10-CM | POA: Diagnosis not present

## 2022-08-01 ENCOUNTER — Other Ambulatory Visit: Payer: Self-pay | Admitting: Orthopedic Surgery

## 2022-08-01 DIAGNOSIS — F419 Anxiety disorder, unspecified: Secondary | ICD-10-CM

## 2022-08-01 NOTE — Telephone Encounter (Signed)
Patient has request refill on medication Clonazepam '1mg'$ . Patient medication last refilled 07/02/2022. Patient has Non Opioid Contract dated 10/15/2021. Patient medication pend and sent to PCP Yvonna Alanis, NP for approval.

## 2022-08-28 DIAGNOSIS — E039 Hypothyroidism, unspecified: Secondary | ICD-10-CM | POA: Diagnosis not present

## 2022-09-02 ENCOUNTER — Other Ambulatory Visit: Payer: Self-pay | Admitting: Orthopedic Surgery

## 2022-09-02 DIAGNOSIS — F419 Anxiety disorder, unspecified: Secondary | ICD-10-CM

## 2022-09-02 NOTE — Telephone Encounter (Signed)
Last refilled on 08/01/2022  Treatment agreement on file from October 2022

## 2022-09-04 DIAGNOSIS — E039 Hypothyroidism, unspecified: Secondary | ICD-10-CM | POA: Diagnosis not present

## 2022-10-02 ENCOUNTER — Other Ambulatory Visit: Payer: Self-pay | Admitting: *Deleted

## 2022-10-02 DIAGNOSIS — F419 Anxiety disorder, unspecified: Secondary | ICD-10-CM

## 2022-10-02 MED ORDER — CLONAZEPAM 1 MG PO TABS: 1.0000 mg | ORAL_TABLET | Freq: Three times a day (TID) | ORAL | 0 refills | Status: DC | PRN

## 2022-10-02 NOTE — Telephone Encounter (Signed)
Patient requested refill.  Epic LR: 09/02/2022. Contract Date: 10/10/2021  Pended Rx and sent to Amy for approval.

## 2022-10-03 DIAGNOSIS — Z23 Encounter for immunization: Secondary | ICD-10-CM | POA: Diagnosis not present

## 2022-10-16 ENCOUNTER — Ambulatory Visit (INDEPENDENT_AMBULATORY_CARE_PROVIDER_SITE_OTHER): Payer: Medicare Other | Admitting: Orthopedic Surgery

## 2022-10-16 ENCOUNTER — Encounter: Payer: Self-pay | Admitting: Orthopedic Surgery

## 2022-10-16 DIAGNOSIS — E039 Hypothyroidism, unspecified: Secondary | ICD-10-CM | POA: Diagnosis not present

## 2022-10-16 DIAGNOSIS — I10 Essential (primary) hypertension: Secondary | ICD-10-CM

## 2022-10-16 DIAGNOSIS — M810 Age-related osteoporosis without current pathological fracture: Secondary | ICD-10-CM | POA: Diagnosis not present

## 2022-10-16 DIAGNOSIS — Z1231 Encounter for screening mammogram for malignant neoplasm of breast: Secondary | ICD-10-CM

## 2022-10-16 DIAGNOSIS — E785 Hyperlipidemia, unspecified: Secondary | ICD-10-CM | POA: Diagnosis not present

## 2022-10-16 DIAGNOSIS — F419 Anxiety disorder, unspecified: Secondary | ICD-10-CM

## 2022-10-16 MED ORDER — DENOSUMAB 60 MG/ML ~~LOC~~ SOSY
60.0000 mg | PREFILLED_SYRINGE | Freq: Once | SUBCUTANEOUS | Status: AC
  Administered 2022-10-16: 60 mg via SUBCUTANEOUS

## 2022-10-16 MED ORDER — CLONAZEPAM 1 MG PO TABS
1.0000 mg | ORAL_TABLET | Freq: Two times a day (BID) | ORAL | 0 refills | Status: DC
Start: 2022-11-02 — End: 2022-10-20

## 2022-10-16 NOTE — Progress Notes (Signed)
Careteam: Patient Care Team: Yvonna Alanis, NP as PCP - General (Adult Health Nurse Practitioner) [place], [place], [place] (Optometry)  Seen by: Windell Moulding, AGNP-C  PLACE OF SERVICE:  [place] Directive information Does Patient Have a Medical Advance Directive?: Yes, Type of Advance Directive: [place];Living will, Does patient want to make changes to medical advance directive?: No - Patient declined  Allergies  Allergen Reactions   Adhesive [Tape]    Lactose Intolerance (Gi)    Sulfa Antibiotics     Chief Complaint  Patient presents with   Annual Exam    Update contract/Prolia injection No P/A, no co-pay     HPI: Patient is a 71 y.o. female seen today for medical management of chronic conditions.   Scheduled to have mammogram and bone density next week.   Prolia vaccine given today. No recent falls or injuries. DEXA 09/2020.   She had flu vaccine [facility]/23/[facility]22 at [facility].   Anxiety had improved. She was started on Klonopin while caring for loved one with Alzheimer's dementia. She also lost husband during that time. She reports mild PTSD from that time in her life. No recent panic attacks. She is doing well. About to travel out of the country for 1 month on vacation. Discussed weaning down klonopin to BID, she is agreeable.   Rheumatology referred her to endocrinology. H/o taking Armour thyroid in past. She was started on  levothyroxine. TSH to be rechecked soon.   Weights stable. Blood pressure at goal.    Review of Systems:  Review of Systems  Constitutional:  Negative for chills, fever, malaise/fatigue and weight loss.  HENT:  Negative for congestion.   Eyes:  Negative for blurred vision and double vision.  Respiratory:  Negative for cough, shortness of breath and wheezing.   Cardiovascular:  Negative for chest pain and leg swelling.  Gastrointestinal:  Negative for abdominal pain, constipation and diarrhea.  Genitourinary:  Negative for  dysuria.  Musculoskeletal:  Negative for falls and joint pain.  Skin:  Negative for rash.  Neurological:  Negative for dizziness, weakness and headaches.  Psychiatric/Behavioral:  Negative for depression and memory loss. The patient is nervous/anxious. The patient does not have insomnia.     Past Medical History:  Diagnosis Date   Anxiety state, unspecified    Barrett's esophagus    Cystocele, midline    Depressive disorder, not elsewhere classified    Encounter for long-term (current) use of other medications    Glycosuria    Insomnia, unspecified    Obstructive sleep apnea (adult) (pediatric)    Oral aphthae    Other and unspecified hyperlipidemia    Proteinuria    Reflux esophagitis    Rosacea    Senile osteoporosis    STD (sexually transmitted disease)    HSV2   Unspecified essential hypertension    Unspecified hypothyroidism    Viral hepatitis A without mention of hepatic coma    Past Surgical History:  Procedure Laterality Date   BREAST ENHANCEMENT SURGERY  2010   COSMETIC SURGERY  2009   for protruding ears   GANGLION CYST EXCISION  2011   right palm   teeth implants     tooth implant  [id number]   tummy tuck     Social History:   reports that she has never smoked. She has never used smokeless tobacco. She reports that she does not currently use alcohol. She reports that she does not use drugs.  Family History  Problem Relation Age of Onset   Heart attack Father    Heart attack Paternal Grandfather    Heart disease Brother    Breast cancer Mother     Medications: Patient's Medications  New Prescriptions   No medications on file  Previous Medications   B COMPLEX VITAMINS CAPSULE    Take 1 capsule by mouth daily.   CLONAZEPAM (KLONOPIN) 1 MG TABLET    Take 1 tablet (1 mg total) by mouth 3 (three) times daily as needed. for anxiety   CRANBERRY 450 MG CAPS    Take 450 mg by mouth in the morning.   DENOSUMAB (PROLIA) 60 MG/ML SOSY INJECTION    Inject 60 mg  into the skin every 6 (six) months.   DOCUSATE SODIUM (COLACE) 100 MG CAPSULE    Take 100 mg by mouth daily as needed for mild constipation. 1 to 4 capsules prn constipation   DOXYLAMINE, SLEEP, (UNISOM) 25 MG TABLET    Take 50 mg by mouth at bedtime as needed.   ESTRADIOL (ESTRACE) 0.1 MG/GM VAGINAL CREAM    PLACE 0.5(1/2) GRAM VAGINALLY 2 TIMES A WEEK AT BEDTIME   IBUPROFEN 200 MG CAPS    Take 200 mg by mouth daily as needed.   LEVOTHYROXINE (SYNTHROID) 100 MCG TABLET    Take 100 mcg by mouth daily before breakfast.   LORATADINE-PSEUDOEPHEDRINE (CLARITIN-D 12 HOUR) 5-120 MG TABLET    Take 1 tablet by mouth 2 (two) times daily as needed.    MULTIPLE VITAMINS-MINERALS (CENTRUM ADULTS) TABS    Take 1 tablet by mouth daily.   OMEPRAZOLE (PRILOSEC) 20 MG CAPSULE    Take 20 mg by mouth daily.   PROBIOTIC PRODUCT (PROBIOTIC DAILY PO)    Take by mouth daily.   UNABLE TO FIND    Take 18,000 capsules by mouth as needed. Med Name: Lactace enzyme 2 by mouth prn  Modified Medications   No medications on file  Discontinued Medications   DENOSUMAB (PROLIA [place])    Inject into the skin once. Every 2 years , not sure of the dosage.   THYROID (ARMOUR THYROID) 60 MG TABLET    TAKE 1 TABLET BY MOUTH EVERY DAY FOR THYROID    Physical Exam:  Vitals:   10/16/22 1506  BP: 139/88  Pulse: 66  Temp: (!) 96.8 F (36 C)  SpO2: 98%  Weight: 136 lb 3.2 oz (61.8 kg)  Height: 5' 6.5" (1.689 m)   Body mass index is 21.65 kg/m. Wt Readings from Last 3 Encounters:  10/16/22 136 lb 3.2 oz (61.8 kg)  03/07/22 136 lb 3.2 oz (61.8 kg)  03/03/22 139 lb 6.4 oz (63.2 kg)    Physical Exam Vitals reviewed.  Constitutional:      General: She is not in acute distress. HENT:     Head: Normocephalic.  Eyes:     General:        Right eye: No discharge.        Left eye: No discharge.  Neck:     Thyroid: No thyroid mass or thyromegaly.     Vascular: No carotid bruit.  Cardiovascular:     Rate and Rhythm: Normal rate  and regular rhythm.     Pulses: Normal pulses.     Heart sounds: Normal heart sounds.  Pulmonary:     Effort: Pulmonary effort is normal. No respiratory distress.     Breath sounds: Normal breath sounds. No wheezing.  Abdominal:     General: Bowel sounds  are normal. There is no distension.     Palpations: Abdomen is soft.     Tenderness: There is no abdominal tenderness.  Musculoskeletal:     Cervical back: Neck supple.     Right lower leg: No edema.     Left lower leg: No edema.  Lymphadenopathy:     Cervical: No cervical adenopathy.  Skin:    General: Skin is warm and dry.     Capillary Refill: Capillary refill takes less than 2 seconds.  Neurological:     General: No focal deficit present.     Mental Status: She is alert and oriented to person, place, and time.  Psychiatric:        Mood and Affect: Mood normal.        Behavior: Behavior normal.     Labs reviewed: Basic Metabolic Panel: Recent Labs    01/24/22 1434  NA 136  K 4.6  CL 104  CO2 25  GLUCOSE 96  BUN 15  CREATININE 0.66  CALCIUM 9.5  TSH 0.21*   Liver Function Tests: Recent Labs    01/24/22 1434  AST 17  ALT 13  BILITOT 0.6  PROT 7.1   No results for input(s): "LIPASE", "AMYLASE" in the last 8760 hours. No results for input(s): "AMMONIA" in the last 8760 hours. CBC: Recent Labs    01/24/22 1434 02/07/22 1441 03/07/22 0929  WBC 2.9* 3.2* 3.6*  NEUTROABS 1,891 2,013 1.5*  HGB 15.2 15.1 15.3*  HCT 45.3* 44.7 44.7  MCV 99.3 99.3 98.0  PLT 230 242 239   Lipid Panel: Recent Labs    01/24/22 1434  CHOL 200*  HDL 63  LDLCALC 118*  TRIG 89  CHOLHDL 3.2   TSH: Recent Labs    01/24/22 1434  TSH 0.21*   A1C: Lab Results  Component Value Date   HGBA1C 4.8 09/20/2020     Assessment/Plan 1. Encounter for screening mammogram for malignant neoplasm of breast  2. Senile osteoporosis - DEXA 2021, t score -3.1 - cont Prolia - scheduled DEXA scan next week - cont weight bearing  exercise - denosumab (PROLIA) injection 60 mg  3. Anxiety - no recent panic attacks - discussed reducing Klonopin since stressors in her life have reduced - will start klonopin 1 mg po BID 11/02/2022 - clonazePAM (KLONOPIN) 1 MG tablet; Take 1 tablet (1 mg total) by mouth 2 (two) times daily. for anxiety  Dispense: 60 tablet; Refill: 0  4. Essential hypertension - controlled without medication - CBC with Differential/Platelet; Future - CMP with eGFR([facility]); Future  5. Acquired hypothyroidism - followed by endocrinology - now on levothyroxine, was Armour thyroid - TSH recheck in 3 months, may consider Cytomel due to osteoporosis per Endo note  6. Hyperlipidemia, unspecified hyperlipidemia type - LDL 118 01/24/2022 - Lipid Panel; Future  Future labs/tests: cbc/diff, cmp, lipid panel within 3 weeks, MMSE next encounter  Total time: 35 minutes. Greater than 50% of total time spent doing patient education regarding health maintenance, hypothyroidism, osteoporosis and anxiety including symptom/medication education.   Next appt: Visit date not found  [place], [facility] [phone]

## 2022-10-16 NOTE — Patient Instructions (Addendum)
Please get Tdap (tetanus) vaccine at local pharmacy  Continue cranberry for UTI prevention. May also purchase Cranberry with d-mannos, or try AZO

## 2022-10-20 ENCOUNTER — Encounter: Payer: Self-pay | Admitting: Orthopedic Surgery

## 2022-10-20 ENCOUNTER — Other Ambulatory Visit: Payer: Self-pay | Admitting: Orthopedic Surgery

## 2022-10-20 DIAGNOSIS — Z780 Asymptomatic menopausal state: Secondary | ICD-10-CM

## 2022-10-20 DIAGNOSIS — F419 Anxiety disorder, unspecified: Secondary | ICD-10-CM

## 2022-10-20 MED ORDER — CLONAZEPAM 1 MG PO TABS: 1.0000 mg | ORAL_TABLET | Freq: Three times a day (TID) | ORAL | 0 refills | Status: DC

## 2022-10-20 NOTE — Progress Notes (Signed)
Referral made to gynecology per patient request. Klonopin adjusted back to TID. Will plan to trial decrease 12/2022.

## 2022-10-22 ENCOUNTER — Other Ambulatory Visit: Payer: Self-pay | Admitting: Orthopedic Surgery

## 2022-10-22 DIAGNOSIS — Z780 Asymptomatic menopausal state: Secondary | ICD-10-CM

## 2022-10-22 NOTE — Progress Notes (Signed)
Gynecology referral to [facility] unable to be made due to insurance. New referral made.

## 2022-10-23 ENCOUNTER — Other Ambulatory Visit: Payer: Medicare Other

## 2022-10-23 DIAGNOSIS — I10 Essential (primary) hypertension: Secondary | ICD-10-CM

## 2022-10-23 DIAGNOSIS — M8588 Other specified disorders of bone density and structure, other site: Secondary | ICD-10-CM | POA: Diagnosis not present

## 2022-10-23 DIAGNOSIS — M810 Age-related osteoporosis without current pathological fracture: Secondary | ICD-10-CM | POA: Diagnosis not present

## 2022-10-23 DIAGNOSIS — Z1231 Encounter for screening mammogram for malignant neoplasm of breast: Secondary | ICD-10-CM | POA: Diagnosis not present

## 2022-10-23 DIAGNOSIS — E785 Hyperlipidemia, unspecified: Secondary | ICD-10-CM

## 2022-10-23 DIAGNOSIS — Z780 Asymptomatic menopausal state: Secondary | ICD-10-CM | POA: Diagnosis not present

## 2022-10-23 LAB — COMPLETE METABOLIC PANEL WITH GFR
AG Ratio: 1.9 (calc) (ref 1.0–2.5)
ALT: 10 U/L (ref 6–29)
AST: 16 U/L (ref 10–35)
Albumin: 4.8 g/dL (ref 3.6–5.1)
Alkaline phosphatase (APISO): 49 U/L (ref 37–153)
BUN: 16 mg/dL (ref 7–25)
CO2: 25 mmol/L (ref 20–32)
Calcium: 9.4 mg/dL (ref 8.6–10.4)
Chloride: 105 mmol/L (ref 98–110)
Creat: 0.7 mg/dL (ref 0.60–1.00)
Globulin: 2.5 g/dL (calc) (ref 1.9–3.7)
Glucose, Bld: 103 mg/dL — ABNORMAL HIGH (ref 65–99)
Potassium: 4.8 mmol/L (ref 3.5–5.3)
Sodium: 137 mmol/L (ref 135–146)
Total Bilirubin: 0.5 mg/dL (ref 0.2–1.2)
Total Protein: 7.3 g/dL (ref 6.1–8.1)
eGFR: 93 mL/min/{1.73_m2} (ref >=60)

## 2022-10-23 LAB — HM DEXA SCAN

## 2022-10-23 LAB — CBC WITH DIFFERENTIAL/PLATELET
Absolute Monocytes: 327 cells/uL (ref 200–950)
Basophils Absolute: 20 cells/uL (ref 0–200)
Basophils Relative: 0.6 %
Eosinophils Absolute: 79 cells/uL (ref 15–500)
Eosinophils Relative: 2.4 %
HCT: 42.8 % (ref 35.0–45.0)
Hemoglobin: 14.8 g/dL (ref 11.7–15.5)
Lymphs Abs: 977 cells/uL (ref 850–3900)
MCH: 34.7 pg — ABNORMAL HIGH (ref 27.0–33.0)
MCHC: 34.6 g/dL (ref 32.0–36.0)
MCV: 100.2 fL — ABNORMAL HIGH (ref 80.0–100.0)
MPV: 11.8 fL (ref 7.5–12.5)
Monocytes Relative: 9.9 %
Neutro Abs: 1898 cells/uL (ref 1500–7800)
Neutrophils Relative %: 57.5 %
Platelets: 277 10*3/uL (ref 140–400)
RBC: 4.27 10*6/uL (ref 3.80–5.10)
RDW: 10.5 % — ABNORMAL LOW (ref 11.0–15.0)
Total Lymphocyte: 29.6 %
WBC: 3.3 10*3/uL — ABNORMAL LOW (ref 3.8–10.8)

## 2022-10-23 LAB — LIPID PANEL
Cholesterol: 206 mg/dL — ABNORMAL HIGH (ref <200)
HDL: 70 mg/dL (ref >=50)
LDL Cholesterol (Calc): 119 mg/dL (calc) — ABNORMAL HIGH
Non-HDL Cholesterol (Calc): 136 mg/dL (calc) — ABNORMAL HIGH (ref <130)
Total CHOL/HDL Ratio: 2.9 (calc) (ref <5.0)
Triglycerides: 80 mg/dL (ref <150)

## 2022-10-23 LAB — HM MAMMOGRAPHY

## 2022-10-24 ENCOUNTER — Encounter: Payer: Self-pay | Admitting: Orthopedic Surgery

## 2022-10-30 DIAGNOSIS — R768 Other specified abnormal immunological findings in serum: Secondary | ICD-10-CM | POA: Diagnosis not present

## 2022-10-30 DIAGNOSIS — M159 Polyosteoarthritis, unspecified: Secondary | ICD-10-CM | POA: Diagnosis not present

## 2022-10-30 DIAGNOSIS — Z882 Allergy status to sulfonamides status: Secondary | ICD-10-CM | POA: Diagnosis not present

## 2022-10-31 DIAGNOSIS — E039 Hypothyroidism, unspecified: Secondary | ICD-10-CM | POA: Diagnosis not present

## 2022-11-03 ENCOUNTER — Other Ambulatory Visit: Payer: Self-pay

## 2022-11-03 DIAGNOSIS — F419 Anxiety disorder, unspecified: Secondary | ICD-10-CM

## 2022-11-03 MED ORDER — CLONAZEPAM 1 MG PO TABS: 1.0000 mg | ORAL_TABLET | Freq: Three times a day (TID) | ORAL | 0 refills | Status: DC

## 2022-11-03 NOTE — Telephone Encounter (Signed)
Patient called requesting refill on clonazepam '1mg'$  tablet. Patient needs to update contract. Not sure when prescription was last filled.  Medication pended and sent to Windell Moulding, NP for approval

## 2022-11-04 MED ORDER — CLONAZEPAM 1 MG PO TABS: 1.0000 mg | ORAL_TABLET | Freq: Three times a day (TID) | ORAL | 0 refills | Status: DC

## 2022-12-01 ENCOUNTER — Encounter: Payer: Self-pay | Admitting: Orthopedic Surgery

## 2022-12-01 DIAGNOSIS — F419 Anxiety disorder, unspecified: Secondary | ICD-10-CM

## 2022-12-01 MED ORDER — CLONAZEPAM 1 MG PO TABS: 1.0000 mg | ORAL_TABLET | Freq: Three times a day (TID) | ORAL | 5 refills | Status: DC

## 2022-12-01 NOTE — Telephone Encounter (Signed)
Patient is requesting a refill of the following medications: Requested Prescriptions   Pending Prescriptions Disp Refills   clonazePAM (KLONOPIN) 1 MG tablet 90 tablet 5    Sig: Take 1 tablet (1 mg total) by mouth 3 (three) times daily. for anxiety    Date of last refill: 11/04/2022   Refill amount: 90  Treatment agreement date: 10/10/2021, notation made on pending appointment for 04/23/2023

## 2023-01-06 DIAGNOSIS — E039 Hypothyroidism, unspecified: Secondary | ICD-10-CM | POA: Diagnosis not present

## 2023-02-12 ENCOUNTER — Encounter: Payer: Medicare Other | Admitting: Obstetrics and Gynecology

## 2023-03-03 ENCOUNTER — Encounter: Payer: Self-pay | Admitting: Nurse Practitioner

## 2023-03-03 ENCOUNTER — Ambulatory Visit (INDEPENDENT_AMBULATORY_CARE_PROVIDER_SITE_OTHER): Payer: Medicare Other | Admitting: Nurse Practitioner

## 2023-03-03 VITALS — BP 132/80 | HR 98 | Ht 65.0 in | Wt 135.8 lb

## 2023-03-03 DIAGNOSIS — E039 Hypothyroidism, unspecified: Secondary | ICD-10-CM | POA: Diagnosis not present

## 2023-03-03 DIAGNOSIS — M810 Age-related osteoporosis without current pathological fracture: Secondary | ICD-10-CM | POA: Diagnosis not present

## 2023-03-03 DIAGNOSIS — N952 Postmenopausal atrophic vaginitis: Secondary | ICD-10-CM

## 2023-03-03 DIAGNOSIS — F419 Anxiety disorder, unspecified: Secondary | ICD-10-CM

## 2023-03-03 NOTE — Progress Notes (Unsigned)
Donna Render, DNP, AGNP-c [facility] [address] [place], [place] [address] [place] [phone]   New patient visit   Patient: Donna Ware   DOB: May 12, 1951   72 y.o. Female  MRN: [id number] Visit Date: 03/03/2023  Patient Care Team: Donna Alanis, NP as PCP - General (Adult Health Nurse Practitioner) [place], Donna Ware, [place] (Optometry)  Today's Vitals   03/03/23 1559  BP: 132/80  Pulse: 98  SpO2: 98%  Weight: 135 lb 12.8 oz (61.6 kg)  Height: '5\' 5"'$  (1.651 m)   Body mass index is 22.6 kg/m.   Today's healthcare provider: Orma Render, NP   Chief Complaint  Patient presents with   other    New pt. Est. No blood work today,    Subjective    Donna Ware is a 72 y.o. female who presents today as a new patient to establish care.    Patient endorses the following concerns presently: Prolia due 4/25  History reviewed and reveals the following: Past Medical History:  Diagnosis Date   Anxiety state, unspecified    Barrett's esophagus    Cystocele, midline    Depressive disorder, not elsewhere classified    Encounter for long-term (current) use of other medications    Glycosuria    Insomnia, unspecified    Obstructive sleep apnea (adult) (pediatric)    Oral aphthae    Other and unspecified hyperlipidemia    Proteinuria    Reflux esophagitis    Rosacea    Senile osteoporosis    STD (sexually transmitted disease)    HSV2   Unspecified essential hypertension    Unspecified hypothyroidism    Viral hepatitis A without mention of hepatic coma    Past Surgical History:  Procedure Laterality Date   BREAST ENHANCEMENT SURGERY  2010   COSMETIC SURGERY  2009   for protruding ears   GANGLION CYST EXCISION  2011   right palm   teeth implants     tooth implant  [id number]   tummy tuck     Family Status  Relation Name Status   Father  Deceased   PGM  Deceased   PGF  Deceased   Brother Jeneen Rinks Deceased   Mother  Deceased    Brother Broadus John Deceased   MGM  Deceased   MGF  Deceased   Family History  Problem Relation Age of Onset   Heart attack Father    Heart attack Paternal Grandfather    Heart disease Brother    Breast cancer Mother    Social History   Socioeconomic History   Marital status: Legally Separated    Spouse name: Not on file   Number of children: Not on file   Years of education: Not on file   Highest education level: Not on file  Occupational History   Not on file  Tobacco Use   Smoking status: Never   Smokeless tobacco: Never  Vaping Use   Vaping Use: Never used  Substance and Sexual Activity   Alcohol use: Not Currently   Drug use: No   Sexual activity: Not on file  Other Topics Concern   Not on file  Social History Narrative   Separated   Significant other for 10 years    Social Determinants of Health   Financial Resource Strain: Low Risk  (07/05/2018)   Overall Financial Resource Strain (CARDIA)    Difficulty of Paying Living Expenses: Not hard at all  Food Insecurity: No Food  Insecurity (07/05/2018)   Hunger Vital Sign    Worried About Running Out of Food in the Last Year: Never true    Ran Out of Food in the Last Year: Never true  Transportation Needs: No Transportation Needs (07/05/2018)   PRAPARE - Hydrologist (Medical): No    Lack of Transportation (Non-Medical): No  Physical Activity: Sufficiently Active (07/05/2018)   Exercise Vital Sign    Days of Exercise per Week: 4 days    Minutes of Exercise per Session: 60 min  Stress: No Stress Concern Present (07/05/2018)   [place]    Feeling of Stress : Only a little  Social Connections: Moderately Isolated (07/05/2018)   Social Connection and Isolation Panel [NHANES]    Frequency of Communication with Friends and Family: Twice a week    Frequency of Social Gatherings with Friends and Family: Once a week    Attends Religious  Services: Never    Marine scientist or Organizations: No    Attends Archivist Meetings: Never    Marital Status: Separated   Outpatient Medications Prior to Visit  Medication Sig   b complex vitamins capsule Take 1 capsule by mouth daily.   clonazePAM (KLONOPIN) 1 MG tablet Take 1 tablet (1 mg total) by mouth 3 (three) times daily. for anxiety   Cranberry 450 MG CAPS Take 450 mg by mouth in the morning.   denosumab (PROLIA) 60 MG/ML SOSY injection Inject 60 mg into the skin every 6 (six) months.   doxylamine, Sleep, (UNISOM) 25 MG tablet Take 50 mg by mouth at bedtime as needed.   estradiol (ESTRACE) 0.1 MG/GM vaginal cream PLACE 0.5(1/2) GRAM VAGINALLY 2 TIMES A WEEK AT BEDTIME   Ibuprofen 200 MG CAPS Take 200 mg by mouth daily as needed.   levothyroxine (SYNTHROID) 100 MCG tablet Take 100 mcg by mouth daily before breakfast.   Multiple Vitamins-Minerals (CENTRUM ADULTS) TABS Take 1 tablet by mouth daily.   omeprazole (PRILOSEC) 20 MG capsule Take 20 mg by mouth daily.   Probiotic Product (PROBIOTIC DAILY PO) Take by mouth daily.   UNABLE TO FIND Take 18,000 capsules by mouth as needed. Med Name: Lactace enzyme 2 by mouth prn   docusate sodium (COLACE) 100 MG capsule Take 100 mg by mouth daily as needed for mild constipation. 1 to 4 capsules prn constipation (Patient not taking: Reported on 03/03/2023)   loratadine-pseudoephedrine (CLARITIN-D 12 HOUR) 5-120 MG tablet Take 1 tablet by mouth 2 (two) times daily as needed.  (Patient not taking: Reported on 03/03/2023)   No facility-administered medications prior to visit.   Allergies  Allergen Reactions   Adhesive [Tape]    Codeine Itching   Lactose Intolerance (Gi)    Sulfa Antibiotics    Immunization History  Administered Date(s) Administered   Fluad Quad(high Dose 65+) 09/12/2019, 09/27/2020, 09/23/2022   Influenza, High Dose Seasonal PF 09/15/2017, 09/20/2018   Influenza,inj,Quad PF,6+ Mos 09/14/2015, 08/28/2016    Influenza-Unspecified 09/21/2012   PFIZER(Purple Top)SARS-COV-2 Vaccination 02/09/2020, 03/05/2020, 09/21/2020   Pfizer Covid-19 Vaccine Bivalent Booster 71yr & up 10/28/2021, 04/29/2022, 10/03/2022   Pneumococcal Conjugate-13 06/18/2017   Pneumococcal Polysaccharide-23 12/29/1994, 07/05/2018   Respiratory Syncytial Virus Vaccine,Recomb Aduvanted(Arexvy) 09/09/2022   Td 04/08/2021   Tdap 12/29/1994, 11/25/2011   Zoster Recombinat (Shingrix) 09/09/2017, 12/17/2017   Zoster, Live 03/15/2008    Health Maintenance Due Health Maintenance Topics with due status: Overdue  Topic Date Due   Medicare Annual Wellness (AWV) 09/14/2021   COVID-19 Vaccine 11/28/2022    Review of Systems All review of systems negative except what is listed in the HPI   Objective    BP 132/80   Pulse 98   Ht '5\' 5"'$  (1.651 m)   Wt 135 lb 12.8 oz (61.6 kg)   LMP 12/30/1999   SpO2 98%   BMI 22.60 kg/m  Physical Exam  No results found for any visits on 03/03/23.  Assessment & Plan      Problem List Items Addressed This Visit   None    No follow-ups on file.      Taiven Greenley, Coralee Pesa, NP, DNP, AGNP-C [facility]

## 2023-03-03 NOTE — Patient Instructions (Signed)
It was a pleasure seeing you today! Thank you for trusting me with your care.   Due to recent changes in healthcare laws, you may see the results of your imaging and laboratory studies on MyChart before I have had a chance to review them. I understand that in some cases there may be results that are confusing or concerning to you. Results typically do not come back at the same time and I often need to wait for multiple results in order to interpret others or have a complete understanding of the situation. Please be assured that I will review your labs and send you comments and recommendations as soon as I have the necessary information to make an informed decision. If you have specific concerns, we can set up an appointment (virtual or in person) to go over details and come up with a plan together.   If you have received any referrals today, the office where the referral was made will be in contact with you to set up your appointment. This may take up to two weeks for some referrals. You may also contact the referral office yourself to see if you can schedule. The information of where the referral was placed is typically on this handout.   If you have received orders for imaging today, the imaging office will contact you to schedule this. Please note that most imaging requires a prior authorization from insurance to ensure insurance will cover their portion. This process can take several days. If insurance declines to cover the imaging, we will be in contact with you to determine next steps.   The only exception to this is x-rays that are sent to [facility]. These are walk-in and do not require an appointment.   If you take regular prescription medications, please contact your pharmacy for routine refill requests. They will send this directly to [place].  If you were ordered new medication as a part of your examination and treatment today, please contact your pharmacy to determine the status. Many  prescriptions require a prior authorization and this process may take several days. If the medication is denied, we will work with you to try alternative medications.  If you are a new patient or are with a new provider, you may need to contact the office the first time refills are required to ensure that the request is received by this office and not another office or provider.   If you have any questions or concerns, please do not hesitate to contact the office via telephone or [place].  MyChart messages are received by the [facility] staff during regular business hours Monday through Friday and we do our best to respond in a timely manner. Please do not use MyChart for urgent messages as there can be a delay of up to 2 business days before your provider can respond.  If your request requires an appointment, the staff will gladly help set that up so that we have the time dedicated to ensure that your questions are appropriately answered.

## 2023-03-04 DIAGNOSIS — N952 Postmenopausal atrophic vaginitis: Secondary | ICD-10-CM | POA: Insufficient documentation

## 2023-03-04 DIAGNOSIS — Z79890 Hormone replacement therapy (postmenopausal): Secondary | ICD-10-CM | POA: Insufficient documentation

## 2023-03-04 DIAGNOSIS — F419 Anxiety disorder, unspecified: Secondary | ICD-10-CM | POA: Insufficient documentation

## 2023-03-04 NOTE — Assessment & Plan Note (Signed)
Most recent DEXA in 09/2022 showing ongoing osteoporosis with T score -3.2. She is doing well on Prolia with no adverse effects from the medication. Her next dose is due 04/23/2023. She would like to receive that here.  Plan: - Continue to monitor bone density every 2 years to evaluate for effectiveness of medication.  - Continue with Prolia, will send information to [place] Pulse to begin the process for approval.  - Continue with daily activity- walking, jogging, light weights, etc to promote bone health.

## 2023-03-04 NOTE — Assessment & Plan Note (Signed)
Chronic anxiety discussed. Patient is anxious about initial meeting, but responds well. She is using clonazepam for anxiety management daily. She does explain that she has not had any adverse events and does not take more than prescribed. She appears to be aware of the dangers associated with this medication. PDMP review shows consistent use of three '1mg'$  doses per day based on fills.  Given the long 1/2 life of this medication (up to 50 hours with normal renal function), I am concerned with the current high dose and the risk of elevated serum levels, specifically if urinary clearance becomes reduced. It appears she has been on this medication for more than 10 years, so the likelihood of physical dependence is very high, which increases the risks of discontinuation in any manner aside from very slow.  I do feel that it is reasonable to consider adding a daily medication to help with baseline anxiety and trazodone nighty for sleep. We may be able to consider a slow taper of clonazepam if her anxiety and sleep are better controlled at baseline. It appears she has tried to taper in the past unsuccessfully. We could also consider an alternative benzodiazepine with a shorter 1/2 life in place of the clonazepam to reduce the risks. Alprazolam would be a much safer option for her. We can discuss this at future visits.  Plan: - Monitor for sedation, memory changes, slurred speech, difficulty concentrating, falls, or confusion due to increased risks associated with benzodiazepine use.  - Use the smallest dose effective to prevent elevated levels in the system.  - Do not drink any alcohol while taking benzodiazepines due to the high risk of death or serious injury -  Consider option of starting daily medication for anxiety to help reduce the symptoms overall.  - Consider adding trazodone every night to help with sleep.

## 2023-03-04 NOTE — Assessment & Plan Note (Signed)
The patient has transitioned from Armour Thyroid to Synthroid under the guidance of endocrinology. She does have concerns about low T3 levels despite normal T4 and TSH. At this time she is not having any symptoms or alarm symptoms. Her current BMI is 22.6, which is wonderful.  Plan: - Maintain the current Synthroid regimen. - Continue to follow with endocrinology for recommendations and management. - No refills needed at this time, but OK to send if patient requests.

## 2023-03-04 NOTE — Assessment & Plan Note (Signed)
Currently managed with weekly estradiol intravaginally. No reported concerns or symptoms at this time. She does mention that her mother's breast cancer was hormone receptor positive.  Plan: - Continue with current regimen as directed.  - Continue with breast cancer screening every year given history in mother and use of estrogen. - Monitor for any signs of shortness of breath, dizziness, leg or arm pain with swelling and warmth, and notify immediately.

## 2023-04-03 ENCOUNTER — Telehealth: Payer: Self-pay | Admitting: Internal Medicine

## 2023-04-03 NOTE — Telephone Encounter (Signed)
Pt called and need to schedule her prolia for end of month. Please advise

## 2023-04-06 NOTE — Progress Notes (Signed)
72 y.o. G0P0 Legally Separated White or Caucasian Not Hispanic or Latino female here for NEW GYN/annual exam.  Pt wants to discuss prolapse, prior h/o a small rectocele. She notices a bulge when she strains. She is sexually active, uses a lubricant, no dyspareunia. She notices soreness and some swelling after intercourse, feels the bulge for a 1-2 days. Uses vaginal estrogen cream 2 x a week, wondering about other options.  Lives with her partner. They have been together for 10 years. Normal BM qd  No urinary c/o  Pt was very unsure about past HSV 2. On review of the records she had +HSV 2 serology in 6/14. Never had an outbreak.     Patient's last menstrual period was 12/30/1999.          Sexually active: Yes.    The current method of family planning is post menopausal status.    Exercising: Yes.       Smoker:  no  Health Maintenance: Pap:  07/25/19 neg: HR HPV neg, 06/18/17 neg History of abnormal Pap:  no MMG:  10/23/22 normal BMD:   10/23/22 osteoporosis, on prolia Colonoscopy: 08/24/14, f/u 10 years. TDaP:  11/25/11, she thinks UTD with her primary Gardasil: n/a   reports that she has never smoked. She has never used smokeless tobacco. She reports that she does not currently use alcohol. She reports that she does not use drugs.   Past Medical History:  Diagnosis Date   Anxiety state, unspecified    Barrett's esophagus    Cystocele, midline    Depressive disorder, not elsewhere classified    Encounter for long-term (current) use of other medications    Glycosuria    Insomnia, unspecified    Obstructive sleep apnea (adult) (pediatric)    Oral aphthae    Other and unspecified hyperlipidemia    Proteinuria    Reflux esophagitis    Rosacea    Senile osteoporosis    STD (sexually transmitted disease)    HSV2   Unspecified essential hypertension    Unspecified hypothyroidism    Viral hepatitis A without mention of hepatic coma     Past Surgical History:  Procedure  Laterality Date   BREAST ENHANCEMENT SURGERY  2010   COSMETIC SURGERY  2009   for protruding ears   GANGLION CYST EXCISION  2011   right palm   teeth implants     tooth implant  1610,9604   tummy tuck      Current Outpatient Medications  Medication Sig Dispense Refill   b complex vitamins capsule Take 1 capsule by mouth daily.     clonazePAM (KLONOPIN) 1 MG tablet Take 1 tablet (1 mg total) by mouth 3 (three) times daily. for anxiety 90 tablet 5   Cranberry 450 MG CAPS Take 450 mg by mouth in the morning.     denosumab (PROLIA) 60 MG/ML SOSY injection Inject 60 mg into the skin every 6 (six) months.     docusate sodium (COLACE) 100 MG capsule Take 100 mg by mouth daily as needed for mild constipation. 1 to 4 capsules prn constipation     doxylamine, Sleep, (UNISOM) 25 MG tablet Take 50 mg by mouth at bedtime as needed.     estradiol (ESTRACE) 0.1 MG/GM vaginal cream PLACE 0.5(1/2) GRAM VAGINALLY 2 TIMES A WEEK AT BEDTIME 42.5 g 5   Ibuprofen 200 MG CAPS Take 200 mg by mouth daily as needed.     levothyroxine (SYNTHROID) 100 MCG tablet Take 100 mcg by  mouth daily before breakfast.     loratadine-pseudoephedrine (CLARITIN-D 12 HOUR) 5-120 MG tablet Take 1 tablet by mouth 2 (two) times daily as needed.     Multiple Vitamins-Minerals (CENTRUM ADULTS) TABS Take 1 tablet by mouth daily.     omeprazole (PRILOSEC) 20 MG capsule Take 20 mg by mouth daily.     Probiotic Product (PROBIOTIC DAILY PO) Take by mouth daily.     UNABLE TO FIND Take 18,000 capsules by mouth as needed. Med Name: Lactace enzyme 2 by mouth prn     No current facility-administered medications for this visit.    Family History  Problem Relation Age of Onset   Heart attack Father    Heart attack Paternal Grandfather    Heart disease Brother    Breast cancer Mother     Review of Systems  All other systems reviewed and are negative.   Exam:   BP 122/84 (BP Location: Left Arm, Patient Position: Sitting, Cuff Size:  Normal)   Pulse 79   Ht 5\' 5"  (1.651 m)   Wt 139 lb (63 kg)   LMP 12/30/1999   SpO2 99%   BMI 23.13 kg/m   Weight change: @WEIGHTCHANGE @ Height:   Height: 5\' 5"  (165.1 cm)  Ht Readings from Last 3 Encounters:  04/17/23 5\' 5"  (1.651 m)  03/03/23 5\' 5"  (1.651 m)  10/16/22 5' 6.5" (1.689 m)    General appearance: alert, cooperative and appears stated age Head: Normocephalic, without obvious abnormality, atraumatic Neck: no adenopathy, supple, symmetrical, trachea midline and thyroid normal to inspection and palpation Lungs: clear to auscultation bilaterally Cardiovascular: regular rate and rhythm Breasts: normal appearance, no masses or tenderness, bilaterally soft implants Abdomen: soft, non-tender; non distended,  no masses,  no organomegaly Extremities: extremities normal, atraumatic, no cyanosis or edema Skin: Skin color, texture, turgor normal. No rashes or lesions Lymph nodes: Cervical, supraclavicular, and axillary nodes normal. No abnormal inguinal nodes palpated Neurologic: Grossly normal   Pelvic: External genitalia:  no lesions              Urethra:  normal appearing urethra with no masses, tenderness or lesions              Bartholins and Skenes: normal                 Vagina: normal appearing vagina with normal color and discharge, no lesions. No significant prolapse noted with valsalva.               Cervix: no lesions               Bimanual Exam:  Uterus:  normal size, contour, position, consistency, mobility, non-tender              Adnexa: no mass, fullness, tenderness               Rectovaginal: Confirms               Anus:  normal sphincter tone, no lesions  Kristin BruinsEmily Flynn, CMA chaperoned for the exam.  1. Encounter for breast and pelvic examination Discussed breast self exam Discussed calcium and vit D intake Colonoscopy UTD Mammogram UTD DEXA with primary  2. Vaginal atrophy Using estrace, would prefer the tablet. Stop estrace - Estradiol 10 MCG TABS  vaginal tablet; Place 1 tablet (10 mcg total) vaginally 2 (two) times a week.  Dispense: 24 tablet; Refill: 4  3. Screening for cervical cancer - Cytology - PAP

## 2023-04-11 ENCOUNTER — Encounter: Payer: Self-pay | Admitting: Nurse Practitioner

## 2023-04-11 DIAGNOSIS — F419 Anxiety disorder, unspecified: Secondary | ICD-10-CM

## 2023-04-11 DIAGNOSIS — N952 Postmenopausal atrophic vaginitis: Secondary | ICD-10-CM

## 2023-04-15 ENCOUNTER — Telehealth: Payer: Self-pay | Admitting: Nurse Practitioner

## 2023-04-15 NOTE — Telephone Encounter (Signed)
Pt called concerning Prolia pt advised estimated cost is $0. Pt scheduled an appt for 04/27/2023.no PA required  Pt advised to call as leaving house so Medication can be set out pt verbalized understanding. Medication ordered from [facility].

## 2023-04-16 ENCOUNTER — Ambulatory Visit: Payer: Medicare Other | Admitting: Orthopedic Surgery

## 2023-04-17 ENCOUNTER — Other Ambulatory Visit (HOSPITAL_COMMUNITY)
Admission: RE | Admit: 2023-04-17 | Discharge: 2023-04-17 | Disposition: A | Discharge status: Home / Self Care | Payer: Medicare Other | Source: Ambulatory Visit | Attending: Obstetrics and Gynecology | Admitting: Obstetrics and Gynecology

## 2023-04-17 ENCOUNTER — Encounter: Payer: Self-pay | Admitting: Obstetrics and Gynecology

## 2023-04-17 ENCOUNTER — Ambulatory Visit (INDEPENDENT_AMBULATORY_CARE_PROVIDER_SITE_OTHER): Payer: Medicare Other | Admitting: Obstetrics and Gynecology

## 2023-04-17 VITALS — BP 122/84 | HR 79 | Ht 65.0 in | Wt 139.0 lb

## 2023-04-17 DIAGNOSIS — B009 Herpesviral infection, unspecified: Secondary | ICD-10-CM

## 2023-04-17 DIAGNOSIS — Z124 Encounter for screening for malignant neoplasm of cervix: Secondary | ICD-10-CM | POA: Insufficient documentation

## 2023-04-17 DIAGNOSIS — Z9189 Other specified personal risk factors, not elsewhere classified: Secondary | ICD-10-CM | POA: Diagnosis not present

## 2023-04-17 DIAGNOSIS — N952 Postmenopausal atrophic vaginitis: Secondary | ICD-10-CM

## 2023-04-17 DIAGNOSIS — Z01419 Encounter for gynecological examination (general) (routine) without abnormal findings: Secondary | ICD-10-CM

## 2023-04-17 MED ORDER — CLONAZEPAM 1 MG PO TABS: 1.0000 mg | ORAL_TABLET | Freq: Three times a day (TID) | ORAL | 5 refills | Status: DC

## 2023-04-17 MED ORDER — ESTRADIOL 10 MCG VA TABS: 1.0000 | ORAL_TABLET | VAGINAL | 4 refills | Status: DC

## 2023-04-17 MED ORDER — ESTRADIOL 0.1 MG/GM VA CREA: TOPICAL_CREAM | VAGINAL | 5 refills | Status: DC

## 2023-04-17 NOTE — Patient Instructions (Signed)

## 2023-04-22 ENCOUNTER — Telehealth: Payer: Self-pay | Admitting: Nurse Practitioner

## 2023-04-22 LAB — CYTOLOGY - PAP: Diagnosis: NEGATIVE

## 2023-04-22 NOTE — Telephone Encounter (Signed)
Called patient to schedule Medicare Annual Wellness Visit (AWV). Left message for patient to call back and schedule Medicare Annual Wellness Visit (AWV).  Last date of AWV: 09/14/20  Please schedule an appointment at any time with [facility].  If any questions, please contact me at [phone].  Thank you ,  Rudell Cobb AWV direct phone # [phone]

## 2023-04-22 NOTE — Telephone Encounter (Signed)
Contacted Candi [facility]ash [place] to schedule their annual wellness visit. Appointment made for 05/12/23.  Rudell Cobb AWV direct phone # [phone]

## 2023-04-23 ENCOUNTER — Ambulatory Visit: Payer: Medicare Other | Admitting: Orthopedic Surgery

## 2023-04-27 ENCOUNTER — Other Ambulatory Visit: Payer: Self-pay

## 2023-04-27 ENCOUNTER — Telehealth: Payer: Self-pay | Admitting: Nurse Practitioner

## 2023-04-27 ENCOUNTER — Other Ambulatory Visit (INDEPENDENT_AMBULATORY_CARE_PROVIDER_SITE_OTHER): Payer: Medicare Other

## 2023-04-27 DIAGNOSIS — M810 Age-related osteoporosis without current pathological fracture: Secondary | ICD-10-CM

## 2023-04-27 DIAGNOSIS — N952 Postmenopausal atrophic vaginitis: Secondary | ICD-10-CM

## 2023-04-27 MED ORDER — DENOSUMAB 60 MG/ML ~~LOC~~ SOSY
60.0000 mg | PREFILLED_SYRINGE | Freq: Once | SUBCUTANEOUS | Status: AC
Start: 2023-04-27 — End: 2023-04-27
  Administered 2023-04-27: 60 mg via SUBCUTANEOUS

## 2023-04-27 NOTE — Telephone Encounter (Signed)
Pt came in and needs a refill on estradoil sent to  [id number] - , [place] - [address]  She is also wanting all prescriptions and refills to be sent to this pharmacy, she says this will be her new preferred pharmacy.

## 2023-04-30 ENCOUNTER — Encounter: Payer: Self-pay | Admitting: Nurse Practitioner

## 2023-04-30 DIAGNOSIS — N952 Postmenopausal atrophic vaginitis: Secondary | ICD-10-CM

## 2023-04-30 DIAGNOSIS — F419 Anxiety disorder, unspecified: Secondary | ICD-10-CM

## 2023-04-30 MED ORDER — ESTRADIOL 10 MCG VA TABS: 1.0000 | ORAL_TABLET | VAGINAL | 4 refills | Status: DC

## 2023-04-30 NOTE — Telephone Encounter (Signed)
Medication sent to new pharmacy

## 2023-05-01 MED ORDER — LEVOTHYROXINE SODIUM 100 MCG PO TABS: 100.0000 ug | ORAL_TABLET | Freq: Every day | ORAL | 3 refills | Status: DC

## 2023-05-01 MED ORDER — OMEPRAZOLE 20 MG PO CPDR: 20.0000 mg | DELAYED_RELEASE_CAPSULE | Freq: Every day | ORAL | 3 refills | Status: DC

## 2023-05-01 MED ORDER — CLONAZEPAM 1 MG PO TABS
1.0000 mg | ORAL_TABLET | Freq: Three times a day (TID) | ORAL | 5 refills | Status: DC
Start: 2023-05-01 — End: 2023-10-20

## 2023-05-01 MED ORDER — ESTRADIOL 10 MCG VA TABS: 1.0000 | ORAL_TABLET | VAGINAL | 4 refills | Status: DC

## 2023-05-03 ENCOUNTER — Encounter: Payer: Self-pay | Admitting: Obstetrics and Gynecology

## 2023-05-03 DIAGNOSIS — N952 Postmenopausal atrophic vaginitis: Secondary | ICD-10-CM

## 2023-05-04 MED ORDER — ESTRADIOL 10 MCG VA TABS
1.0000 | ORAL_TABLET | VAGINAL | 4 refills | Status: DC
Start: 2023-05-04 — End: 2023-05-19

## 2023-05-04 NOTE — Telephone Encounter (Signed)
OFFICE NOTE 4/19.2024  2. Vaginal atrophy Using estrace, would prefer the tablet. Stop estrace - Estradiol 10 MCG TABS vaginal tablet; Place 1 tablet (10 mcg total) vaginally 2 (two) times a week.  Dispense: 24 tablet; Refill: 4  Rx sent to new pharmacy.

## 2023-05-05 NOTE — Telephone Encounter (Signed)
Will forward to JJ for final review.

## 2023-05-12 ENCOUNTER — Ambulatory Visit (INDEPENDENT_AMBULATORY_CARE_PROVIDER_SITE_OTHER): Payer: Medicare Other

## 2023-05-12 VITALS — Ht 65.0 in | Wt 135.0 lb

## 2023-05-12 DIAGNOSIS — Z0000 Encounter for general adult medical examination without abnormal findings: Secondary | ICD-10-CM

## 2023-05-12 NOTE — Patient Instructions (Signed)
[name] , Thank you for taking time to come for your Medicare Wellness Visit. I appreciate your ongoing commitment to your health goals. Please review the following plan we discussed and let me know if I can assist you in the future.   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  Completed   Zoster (Shingles) Vaccine  Completed   HPV Vaccine  Aged Out    Advanced directives: copy in chart  Conditions/risks identified: none  Next appointment: Follow up in one year for your annual wellness visit    Preventive Care 65 Years and Older, Female Preventive care refers to lifestyle choices and visits with your health care provider that can promote health and wellness. What does preventive care include? A yearly physical exam. This is also called an annual well check. Dental exams once or twice a year. Routine eye exams. Ask your health care provider how often you should have your eyes checked. Personal lifestyle choices, including: Daily care of your teeth and gums. Regular physical activity. Eating a healthy diet. Avoiding tobacco and drug use. Limiting alcohol use. Practicing safe sex. Taking low-dose aspirin every day. Taking vitamin and mineral supplements as recommended by your health care  provider. What happens during an annual well check? The services and screenings done by your health care provider during your annual well check will depend on your age, overall health, lifestyle risk factors, and family history of disease. Counseling  Your health care provider may ask you questions about your: Alcohol use. Tobacco use. Drug use. Emotional well-being. Home and relationship well-being. Sexual activity. Eating habits. History of falls. Memory and ability to understand (cognition). Work and work Astronomer. Reproductive health. Screening  You may have the following tests or measurements: Height, weight, and BMI. Blood pressure. Lipid and cholesterol levels. These may be checked every 5 years, or more frequently if you are over 28 years old. Skin check. Lung cancer screening. You may have this screening every year starting at age 65 if you have a 30-pack-year history of smoking and currently smoke or have quit within the past 15 years. Fecal occult blood test (FOBT) of the stool. You may have this test every year starting at age 57. Flexible sigmoidoscopy or colonoscopy. You may have a sigmoidoscopy every 5 years or a colonoscopy every 10 years starting at age 40. Hepatitis C blood test. Hepatitis B blood test. Sexually transmitted disease (STD) testing. Diabetes screening. This is done by checking your blood sugar (glucose) after you have not eaten for a while (fasting). You may have this done every 1-3  years. Bone density scan. This is done to screen for osteoporosis. You may have this done starting at age 80. Mammogram. This may be done every 1-2 years. Talk to your health care provider about how often you should have regular mammograms. Talk with your health care provider about your test results, treatment options, and if necessary, the need for more tests. Vaccines  Your health care provider may recommend certain vaccines, such as: Influenza vaccine. This is  recommended every year. Tetanus, diphtheria, and acellular pertussis (Tdap, Td) vaccine. You may need a Td booster every 10 years. Zoster vaccine. You may need this after age 76. Pneumococcal 13-valent conjugate (PCV13) vaccine. One dose is recommended after age 66. Pneumococcal polysaccharide (PPSV23) vaccine. One dose is recommended after age 81. Talk to your health care provider about which screenings and vaccines you need and how often you need them. This information is not intended to replace advice given to you by your health care provider. Make sure you discuss any questions you have with your health care provider. Document Released: 01/11/2016 Document Revised: 09/03/2016 Document Reviewed: 10/16/2015 Elsevier Interactive Patient Education  2017 [place] Prevention in the Home Falls can cause injuries. They can happen to people of all ages. There are many things you can do to make your home safe and to help prevent falls. What can I do on the outside of my home? Regularly fix the edges of walkways and driveways and fix any cracks. Remove anything that might make you trip as you walk through a door, such as a raised step or threshold. Trim any bushes or trees on the path to your home. Use bright outdoor lighting. Clear any walking paths of anything that might make someone trip, such as rocks or tools. Regularly check to see if handrails are loose or broken. Make sure that both sides of any steps have handrails. Any raised decks and porches should have guardrails on the edges. Have any leaves, snow, or ice cleared regularly. Use sand or salt on walking paths during winter. Clean up any spills in your garage right away. This includes oil or grease spills. What can I do in the bathroom? Use night lights. Install grab bars by the toilet and in the tub and shower. Do not use towel bars as grab bars. Use non-skid mats or decals in the tub or shower. If you need to sit down in  the shower, use a plastic, non-slip stool. Keep the floor dry. Clean up any water that spills on the floor as soon as it happens. Remove soap buildup in the tub or shower regularly. Attach bath mats securely with double-sided non-slip rug tape. Do not have throw rugs and other things on the floor that can make you trip. What can I do in the bedroom? Use night lights. Make sure that you have a light by your bed that is easy to reach. Do not use any sheets or blankets that are too big for your bed. They should not hang down onto the floor. Have a firm chair that has side arms. You can use this for support while you get dressed. Do not have throw rugs and other things on the floor that can make you trip. What can I do in the kitchen? Clean up any spills right away. Avoid walking on wet floors. Keep items that you use a lot in easy-to-reach places. If you need to reach something above you, use a strong step stool that has a grab  bar. Keep electrical cords out of the way. Do not use floor polish or wax that makes floors slippery. If you must use wax, use non-skid floor wax. Do not have throw rugs and other things on the floor that can make you trip. What can I do with my stairs? Do not leave any items on the stairs. Make sure that there are handrails on both sides of the stairs and use them. Fix handrails that are broken or loose. Make sure that handrails are as long as the stairways. Check any carpeting to make sure that it is firmly attached to the stairs. Fix any carpet that is loose or worn. Avoid having throw rugs at the top or bottom of the stairs. If you do have throw rugs, attach them to the floor with carpet tape. Make sure that you have a light switch at the top of the stairs and the bottom of the stairs. If you do not have them, ask someone to add them for you. What else can I do to help prevent falls? Wear shoes that: Do not have high heels. Have rubber bottoms. Are comfortable  and fit you well. Are closed at the toe. Do not wear sandals. If you use a stepladder: Make sure that it is fully opened. Do not climb a closed stepladder. Make sure that both sides of the stepladder are locked into place. Ask someone to hold it for you, if possible. Clearly mark and make sure that you can see: Any grab bars or handrails. First and last steps. Where the edge of each step is. Use tools that help you move around (mobility aids) if they are needed. These include: Canes. Walkers. Scooters. Crutches. Turn on the lights when you go into a dark area. Replace any light bulbs as soon as they burn out. Set up your furniture so you have a clear path. Avoid moving your furniture around. If any of your floors are uneven, fix them. If there are any pets around you, be aware of where they are. Review your medicines with your doctor. Some medicines can make you feel dizzy. This can increase your chance of falling. Ask your doctor what other things that you can do to help prevent falls. This information is not intended to replace advice given to you by your health care provider. Make sure you discuss any questions you have with your health care provider. Document Released: 10/11/2009 Document Revised: 05/22/2016 Document Reviewed: 01/19/2015 Elsevier Interactive Patient Education  2017 [employer].

## 2023-05-12 NOTE — Progress Notes (Signed)
I connected with  Donna Ware on 05/12/23 by a audio enabled telemedicine application and verified that I am speaking with the correct person using two identifiers.  Patient Location: Home  Provider Location: Office/Clinic  I discussed the limitations of evaluation and management by telemedicine. The patient expressed understanding and agreed to proceed.  Subjective:   Donna Ware is a 72 y.o. female who presents for Medicare Annual (Subsequent) preventive examination.  Patient Medicare AWV questionnaire was completed by the patient on 05/08/2023; I have confirmed that all information answered by patient is correct and no changes since this date.     Review of Systems     Cardiac Risk Factors include: advanced age (>66men, >5 women)     Objective:    Today's Vitals   05/12/23 1557  Weight: 135 lb (61.2 kg)  Height: 5\' 5"  (1.651 m)   Body mass index is 22.47 kg/m.     05/12/2023    4:05 PM 10/16/2022   11:53 AM 09/27/2020    3:20 PM 09/14/2020    3:41 PM 03/22/2020    3:25 PM 09/12/2019    2:41 PM 09/12/2019    2:34 PM  Advanced Directives  Does Patient Have a Medical Advance Directive? Yes Yes Yes Yes Yes Yes Yes  Type of Estate agent of [place];Living will Healthcare Power of [place];Living will Healthcare Power of [place];Living will;Out of facility DNR (pink MOST or yellow form) Living will;Healthcare Power of [employer] of [place];Living will Healthcare Power of [place];Living will Healthcare Power of [place];Living will  Does patient want to make changes to medical advance directive?  No - Patient declined No - Patient declined No - Patient declined No - Guardian declined No - Patient declined No - Patient declined  Copy of Healthcare Power of Attorney in Chart? Yes - validated most recent copy scanned in chart (See row information) Yes - validated most recent copy scanned in chart (See row information) Yes - validated  most recent copy scanned in chart (See row information) No - copy requested Yes - validated most recent copy scanned in chart (See row information) Yes - validated most recent copy scanned in chart (See row information) Yes - validated most recent copy scanned in chart (See row information)    Current Medications (verified) Outpatient Encounter Medications as of 05/12/2023  Medication Sig   b complex vitamins capsule Take 1 capsule by mouth daily.   clonazePAM (KLONOPIN) 1 MG tablet Take 1 tablet (1 mg total) by mouth 3 (three) times daily. for anxiety   Cranberry 450 MG CAPS Take 450 mg by mouth in the morning.   denosumab (PROLIA) 60 MG/ML SOSY injection Inject 60 mg into the skin every 6 (six) months.   docusate sodium (COLACE) 100 MG capsule Take 100 mg by mouth daily as needed for mild constipation. 1 to 4 capsules prn constipation   doxylamine, Sleep, (UNISOM) 25 MG tablet Take 50 mg by mouth at bedtime as needed.   Estradiol 10 MCG TABS vaginal tablet Place 1 tablet (10 mcg total) vaginally 2 (two) times a week.   Ibuprofen 200 MG CAPS Take 200 mg by mouth daily as needed.   levothyroxine (SYNTHROID) 100 MCG tablet Take 1 tablet (100 mcg total) by mouth daily before breakfast.   loratadine-pseudoephedrine (CLARITIN-D 12 HOUR) 5-120 MG tablet Take 1 tablet by mouth 2 (two) times daily as needed.   Multiple Vitamins-Minerals (CENTRUM ADULTS) TABS Take 1 tablet by mouth daily.  omeprazole (PRILOSEC) 20 MG capsule Take 1 capsule (20 mg total) by mouth daily.   Probiotic Product (PROBIOTIC DAILY PO) Take by mouth daily.   UNABLE TO FIND Take 2 capsules by mouth as needed. Med Name: Lactace enzyme 2 by mouth prn   No facility-administered encounter medications on file as of 05/12/2023.    Allergies (verified) Adhesive [tape], Codeine, Lactose, Lactose intolerance (gi), and Sulfa antibiotics   History: Past Medical History:  Diagnosis Date   Anxiety state, unspecified    Barrett's  esophagus    Cystocele, midline    Depressive disorder, not elsewhere classified    Encounter for long-term (current) use of other medications    Glycosuria    Insomnia, unspecified    Obstructive sleep apnea (adult) (pediatric)    Oral aphthae    Other and unspecified hyperlipidemia    Proteinuria    Reflux esophagitis    Rosacea    Senile osteoporosis    STD (sexually transmitted disease)    HSV2   Unspecified essential hypertension    Unspecified hypothyroidism    Viral hepatitis A without mention of hepatic coma    Past Surgical History:  Procedure Laterality Date   BREAST ENHANCEMENT SURGERY  2010   COSMETIC SURGERY  2009   for protruding ears   GANGLION CYST EXCISION  2011   right palm   teeth implants     tooth implant  1610,9604   tummy tuck     Family History  Problem Relation Age of Onset   Heart attack Father    Heart attack Paternal Grandfather    Heart disease Brother    Breast cancer Mother    Social History   Socioeconomic History   Marital status: Legally Separated    Spouse name: Not on file   Number of children: Not on file   Years of education: Not on file   Highest education level: Not on file  Occupational History   Not on file  Tobacco Use   Smoking status: Never   Smokeless tobacco: Never  Vaping Use   Vaping Use: Never used  Substance and Sexual Activity   Alcohol use: Not Currently   Drug use: No   Sexual activity: Yes    Partners: Male    Birth control/protection: Post-menopausal    Comment: >5 partners, >53 y/o, HSV2  Other Topics Concern   Not on file  Social History Narrative   Separated   Significant other for 10 years    Social Determinants of Health   Financial Resource Strain: Low Risk  (05/08/2023)   Overall Financial Resource Strain (CARDIA)    Difficulty of Paying Living Expenses: Not hard at all  Food Insecurity: No Food Insecurity (05/08/2023)   Hunger Vital Sign    Worried About Running Out of Food in the  Last Year: Never true    Ran Out of Food in the Last Year: Never true  Transportation Needs: No Transportation Needs (05/08/2023)   PRAPARE - Administrator, Civil Service (Medical): No    Lack of Transportation (Non-Medical): No  Physical Activity: Sufficiently Active (05/08/2023)   Exercise Vital Sign    Days of Exercise per Week: 4 days    Minutes of Exercise per Session: 60 min  Stress: Patient Declined (05/08/2023)   [employer] of Occupational Health - Occupational Stress Questionnaire    Feeling of Stress : Patient declined  Social Connections: Unknown (05/08/2023)   Social Connection and Isolation Panel [NHANES]  Frequency of Communication with Friends and Family: Three times a week    Frequency of Social Gatherings with Friends and Family: Patient declined    Attends Religious Services: Not on Marketing executive or Organizations: Yes    Attends Engineer, structural: More than 4 times per year    Marital Status: Separated    Tobacco Counseling Counseling given: Not Answered   Clinical Intake:  Pre-visit preparation completed: Yes  Pain : No/denies pain     Nutritional Status: BMI of 19-24  Normal Nutritional Risks: None Diabetes: No  How often do you need to have someone help you when you read instructions, pamphlets, or other written materials from your doctor or pharmacy?: 1 - Never  Diabetic? no  Interpreter Needed?: No  Information entered by :: NAllen LPN   Activities of Daily Living    05/08/2023    1:20 PM  In your present state of health, do you have any difficulty performing the following activities:  Hearing? 0  Vision? 0  Difficulty concentrating or making decisions? 0  Walking or climbing stairs? 0  Dressing or bathing? 0  Doing errands, shopping? 0  Preparing Food and eating ? N  Using the Toilet? N  In the past six months, have you accidently leaked urine? N  Do you have problems with loss of bowel  control? N  Managing your Medications? N  Managing your Finances? N  Housekeeping or managing your Housekeeping? N    Patient Care Team: Early, Sung Amabile, NP as PCP - General (Nurse Practitioner) [place], Heather, OD (Optometry)  Indicate any recent Medical Services you may have received from other than [facility] providers in the past year (date may be approximate).     Assessment:   This is a routine wellness examination for Ta.  Hearing/Vision screen Vision Screening - Comments:: Regular eye exams, [facility]  Dietary issues and exercise activities discussed: Current Exercise Habits: Home exercise routine, Type of exercise: treadmill;yoga;strength training/weights, Time (Minutes): 60, Frequency (Times/Week): 4, Weekly Exercise (Minutes/Week): 240   Goals Addressed             This Visit's Progress    Patient Stated       05/12/2023, no goals       Depression Screen    05/12/2023    4:07 PM 03/03/2023    3:59 PM 10/10/2021    4:01 PM 09/27/2020    3:19 PM 09/14/2020    3:36 PM 03/22/2020    3:25 PM 09/12/2019    2:41 PM  PHQ 2/9 Scores  PHQ - 2 Score 0 0 0 0 0 0 0  PHQ- 9 Score 0          Fall Risk    05/08/2023    1:20 PM 03/03/2023    3:59 PM 10/10/2021    3:59 PM 09/27/2020    3:19 PM 09/14/2020    3:35 PM  Fall Risk   Falls in the past year? 0 0 0 0 0  Number falls in past yr: 0 0 0 0 0  Injury with Fall? 0 0 0 0 0  Risk for fall due to : Medication side effect No Fall Risks No Fall Risks    Follow up Falls prevention discussed;Education provided;Falls evaluation completed Falls evaluation completed Falls evaluation completed;Education provided;Falls prevention discussed      FALL RISK PREVENTION PERTAINING TO THE HOME:  Any stairs in or around the home? Yes  If so, are there any without handrails? No  Home free of loose throw rugs in walkways, pet beds, electrical cords, etc? Yes  Adequate lighting in your home to reduce risk of falls? Yes   ASSISTIVE  DEVICES UTILIZED TO PREVENT FALLS:  Life alert? No  Use of a cane, walker or w/c? No  Grab bars in the bathroom? Yes  Shower chair or bench in shower? Yes  Elevated toilet seat or a handicapped toilet? No   TIMED UP AND GO:  Was the test performed? No .      Cognitive Function:    09/12/2019    2:42 PM 07/05/2018    1:08 PM 06/18/2017    1:52 PM  MMSE - Mini Mental State Exam  Orientation to time 5 5 5   Orientation to Place 5 5 5   Registration 3 3 3   Attention/ Calculation 5 5 5   Recall 3 3 3   Language- name 2 objects 2 2 2   Language- repeat 1 1 1   Language- follow 3 step command 3 3 3   Language- read & follow direction 1 1 1   Write a sentence 1 1 1   Copy design 1 1 1   Total score 30 30 30         05/12/2023    4:08 PM 09/14/2020    3:36 PM  6CIT Screen  What Year? 0 points 0 points  What month? 0 points 0 points  What time? 0 points 0 points  Count back from 20 0 points 2 points  Months in reverse 0 points 0 points  Repeat phrase 0 points 0 points  Total Score 0 points 2 points    Immunizations Immunization History  Administered Date(s) Administered   Fluad Quad(high Dose 65+) 09/12/2019, 09/27/2020, 09/23/2022   Influenza, High Dose Seasonal PF 09/15/2017, 09/20/2018   Influenza,inj,Quad PF,6+ Mos 09/14/2015, 08/28/2016   Influenza-Unspecified 09/21/2012   PFIZER(Purple Top)SARS-COV-2 Vaccination 02/09/2020, 03/05/2020, 09/21/2020, 04/01/2021   Pfizer Covid-19 Vaccine Bivalent Booster 69yrs & up 10/28/2021, 04/29/2022, 10/03/2022   Pneumococcal Conjugate-13 06/18/2017   Pneumococcal Polysaccharide-23 12/29/1994, 07/05/2018   Respiratory Syncytial Virus Vaccine,Recomb Aduvanted(Arexvy) 09/09/2022   Td 04/08/2021   Tdap 12/29/1994, 11/25/2011, 05/01/2023   Zoster Recombinat (Shingrix) 09/09/2017, 12/17/2017   Zoster, Live 03/15/2008    TDAP status: Up to date  Flu Vaccine status: Up to date  Pneumococcal vaccine status: Up to date  Covid-19 vaccine  status: Completed vaccines  Qualifies for Shingles Vaccine? Yes   Zostavax completed Yes   Shingrix Completed?: Yes  Screening Tests Health Maintenance  Topic Date Due   Medicare Annual Wellness (AWV)  09/14/2021   COVID-19 Vaccine (8 - 2023-24 season) 11/28/2022   INFLUENZA VACCINE  07/30/2023   COLONOSCOPY (Pts 45-8yrs Insurance coverage will need to be confirmed)  08/24/2024   MAMMOGRAM  10/23/2024   DTaP/Tdap/Td (5 - Td or Tdap) 04/30/2033   Pneumonia Vaccine 23+ Years old  Completed   DEXA SCAN  Completed   Hepatitis C Screening  Completed   Zoster Vaccines- Shingrix  Completed   HPV VACCINES  Aged Out    Health Maintenance  Health Maintenance Due  Topic Date Due   Medicare Annual Wellness (AWV)  09/14/2021   COVID-19 Vaccine (8 - 2023-24 season) 11/28/2022    Colorectal cancer screening: Type of screening: Colonoscopy. Completed 08/24/2014. Repeat every 10 years  Mammogram status: Completed 10/23/2022. Repeat every year  Bone Density status: Completed 10/23/2022.   Lung Cancer Screening: (Low Dose CT Chest recommended if Age 48-80  years, 30 pack-year currently smoking OR have quit w/in 15years.) does not qualify.   Lung Cancer Screening Referral: no  Additional Screening:  Hepatitis C Screening: does qualify; Completed 06/18/2017  Vision Screening: Recommended annual ophthalmology exams for early detection of glaucoma and other disorders of the eye. Is the patient up to date with their annual eye exam?  Yes  Who is the provider or what is the name of the office in which the patient attends annual eye exams? [facility] If pt is not established with a provider, would they like to be referred to a provider to establish care? No .   Dental Screening: Recommended annual dental exams for proper oral hygiene  Community Resource Referral / Chronic Care Management: CRR required this visit?  No   CCM required this visit?  No      Plan:     I have personally  reviewed and noted the following in the patient's chart:   Medical and social history Use of alcohol, tobacco or illicit drugs  Current medications and supplements including opioid prescriptions. Patient is not currently taking opioid prescriptions. Functional ability and status Nutritional status Physical activity Advanced directives List of other physicians Hospitalizations, surgeries, and ER visits in previous 12 months Vitals Screenings to include cognitive, depression, and falls Referrals and appointments  In addition, I have reviewed and discussed with patient certain preventive protocols, quality metrics, and best practice recommendations. A written personalized care plan for preventive services as well as general preventive health recommendations were provided to patient.     Barb Merino, LPN   5/40/9811   Nurse Notes: none  Due to this being a virtual visit, the after visit summary with patients personalized plan was offered to patient via mail or my-chart.  Patient would like to access on my-chart/

## 2023-05-18 ENCOUNTER — Encounter: Payer: Self-pay | Admitting: Obstetrics and Gynecology

## 2023-05-18 DIAGNOSIS — N952 Postmenopausal atrophic vaginitis: Secondary | ICD-10-CM

## 2023-05-19 MED ORDER — ESTRADIOL 10 MCG VA TABS: 1.0000 | ORAL_TABLET | VAGINAL | 3 refills | Status: DC

## 2023-05-19 NOTE — Telephone Encounter (Signed)
Last AEX 04/17/2023--recall placed for 2025. Last mammo 09/2022-WNL  Rx pend.

## 2023-06-19 DIAGNOSIS — E039 Hypothyroidism, unspecified: Secondary | ICD-10-CM | POA: Diagnosis not present

## 2023-06-26 DIAGNOSIS — E039 Hypothyroidism, unspecified: Secondary | ICD-10-CM | POA: Diagnosis not present

## 2023-07-23 DIAGNOSIS — D2372 Other benign neoplasm of skin of left lower limb, including hip: Secondary | ICD-10-CM | POA: Diagnosis not present

## 2023-07-23 DIAGNOSIS — L820 Inflamed seborrheic keratosis: Secondary | ICD-10-CM | POA: Diagnosis not present

## 2023-07-23 DIAGNOSIS — L821 Other seborrheic keratosis: Secondary | ICD-10-CM | POA: Diagnosis not present

## 2023-07-23 DIAGNOSIS — D225 Melanocytic nevi of trunk: Secondary | ICD-10-CM | POA: Diagnosis not present

## 2023-08-22 DIAGNOSIS — R350 Frequency of micturition: Secondary | ICD-10-CM | POA: Diagnosis not present

## 2023-08-27 DIAGNOSIS — N3001 Acute cystitis with hematuria: Secondary | ICD-10-CM | POA: Diagnosis not present

## 2023-09-09 DIAGNOSIS — Z23 Encounter for immunization: Secondary | ICD-10-CM | POA: Diagnosis not present

## 2023-09-18 ENCOUNTER — Telehealth: Payer: Self-pay | Admitting: Internal Medicine

## 2023-09-18 NOTE — Telephone Encounter (Signed)
Called pt regarding Prolia  No PA is required  Estimated cost $0  Due after 10/27/23. Once scheduled I can order through physicans services

## 2023-09-22 NOTE — Telephone Encounter (Signed)
Pt was scheduled for Prolia on 11/02/23 and I have ordered through physician services

## 2023-09-22 NOTE — Telephone Encounter (Signed)
Left message for pt to call me back 

## 2023-09-23 DIAGNOSIS — H2513 Age-related nuclear cataract, bilateral: Secondary | ICD-10-CM | POA: Diagnosis not present

## 2023-09-23 DIAGNOSIS — Z23 Encounter for immunization: Secondary | ICD-10-CM | POA: Diagnosis not present

## 2023-09-23 DIAGNOSIS — H04123 Dry eye syndrome of bilateral lacrimal glands: Secondary | ICD-10-CM | POA: Diagnosis not present

## 2023-10-16 DIAGNOSIS — Z79899 Other long term (current) drug therapy: Secondary | ICD-10-CM | POA: Diagnosis not present

## 2023-10-16 DIAGNOSIS — E039 Hypothyroidism, unspecified: Secondary | ICD-10-CM | POA: Diagnosis not present

## 2023-10-20 ENCOUNTER — Ambulatory Visit: Payer: Medicare Other | Admitting: Nurse Practitioner

## 2023-10-20 ENCOUNTER — Encounter: Payer: Self-pay | Admitting: Nurse Practitioner

## 2023-10-20 VITALS — BP 128/82 | HR 68 | Ht 65.0 in | Wt 132.2 lb

## 2023-10-20 DIAGNOSIS — D72819 Decreased white blood cell count, unspecified: Secondary | ICD-10-CM

## 2023-10-20 DIAGNOSIS — E559 Vitamin D deficiency, unspecified: Secondary | ICD-10-CM | POA: Diagnosis not present

## 2023-10-20 DIAGNOSIS — M810 Age-related osteoporosis without current pathological fracture: Secondary | ICD-10-CM | POA: Diagnosis not present

## 2023-10-20 DIAGNOSIS — Z1211 Encounter for screening for malignant neoplasm of colon: Secondary | ICD-10-CM

## 2023-10-20 DIAGNOSIS — Z79899 Other long term (current) drug therapy: Secondary | ICD-10-CM | POA: Diagnosis not present

## 2023-10-20 DIAGNOSIS — K2100 Gastro-esophageal reflux disease with esophagitis, without bleeding: Secondary | ICD-10-CM | POA: Diagnosis not present

## 2023-10-20 DIAGNOSIS — Z0000 Encounter for general adult medical examination without abnormal findings: Secondary | ICD-10-CM | POA: Diagnosis not present

## 2023-10-20 DIAGNOSIS — F5104 Psychophysiologic insomnia: Secondary | ICD-10-CM | POA: Diagnosis not present

## 2023-10-20 DIAGNOSIS — M816 Localized osteoporosis [Lequesne]: Secondary | ICD-10-CM

## 2023-10-20 DIAGNOSIS — Z1231 Encounter for screening mammogram for malignant neoplasm of breast: Secondary | ICD-10-CM

## 2023-10-20 DIAGNOSIS — F419 Anxiety disorder, unspecified: Secondary | ICD-10-CM | POA: Diagnosis not present

## 2023-10-20 DIAGNOSIS — E039 Hypothyroidism, unspecified: Secondary | ICD-10-CM

## 2023-10-20 DIAGNOSIS — F32A Depression, unspecified: Secondary | ICD-10-CM

## 2023-10-20 DIAGNOSIS — Z13820 Encounter for screening for osteoporosis: Secondary | ICD-10-CM

## 2023-10-20 DIAGNOSIS — Z79890 Hormone replacement therapy (postmenopausal): Secondary | ICD-10-CM

## 2023-10-20 MED ORDER — TRAZODONE HCL 150 MG PO TABS: 150.0000 mg | ORAL_TABLET | Freq: Every day | ORAL | 2 refills | Status: DC

## 2023-10-20 MED ORDER — CLONAZEPAM 1 MG PO TABS: 1.0000 mg | ORAL_TABLET | Freq: Three times a day (TID) | ORAL | 5 refills | Status: DC

## 2023-10-20 NOTE — Progress Notes (Signed)
Donna Clamp, DNP, AGNP-c [facility] [address] [place], [place] [address] Main Office [phone]  BP 128/82   Pulse 68   Ht 5\' 5"  (1.651 m)   Wt 132 lb 3.2 oz (60 kg)   LMP 12/30/1999   BMI 22.00 kg/m    Subjective:    Patient ID: Donna Ware, female    DOB: 07/17/1951, 72 y.o.   MRN: [id number]  HPI: Donna Ware is a 72 y.o. female presenting on 10/20/2023 for comprehensive medical examination.   History of Present Illness Donna Ware presents for a routine check-up. She reports a recent visit to an endocrinologist for a thyroid condition, where she was prescribed T3 (Cytomel). The patient notes a slight decrease in weight since starting this medication.  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 Over the years, the patient has successfully reduced the dosage from eight times a day to three times a day. She expresses dependency on the medication, particularly for sleep, as she often wakes up multiple times during the night. The patient has tried various other sleep aids, including Unisom and melatonin, with limited success.  In addition to the clonazepam, the patient has been on long-term omeprazole. She expresses concerns about potential side effects, including low white cell count and vitamin deficiencies, after reading about these in a drugstore pamphlet.    The patient also mentions some skin changes, such as combination skin and peeling, which she wonders might be related to the omeprazole use.   Pertinent items are noted in HPI.  IMMUNIZATIONS:   Flu Vaccine: Flu vaccine given today Prevnar 13: Prevnar 13 completed, documentation in chart Prevnar 20: Prevnar 20 completed, documentation in chart Pneumovax 23: Pneumovax completed, documentation in chart Vac Shingrix: Shingrix both doses completed, documentation in chart HPV: N/A or Aged  Out Tetanus: Tetanus completed in the last 10 years COVID: COVID completed, documentation in chart  RSV: No  HEALTH MAINTENANCE: Pap Smear HM Status: N/A Mammogram HM Status: was ordered today Colon Cancer Screening HM Status: is up to date Bone Density HM Status: is up to date STI Testing HM Status: was declined  Lung CT HM Status: N/A  Concerns with vision, hearing, or dentition: No  Most Recent Depression Screen:     10/20/2023    2:58 PM 05/12/2023    4:07 PM 03/03/2023    3:59 PM 10/10/2021    4:01 PM 09/27/2020    3:19 PM  Depression screen PHQ 2/9  Decreased Interest 0 0 0 0 0  Down, Depressed, Hopeless 0 0 0 0 0  PHQ - 2 Score 0 0 0 0 0  Altered sleeping  0     Tired, decreased energy  0     Change in appetite  0     Feeling bad or failure about yourself   0     Trouble concentrating  0     Moving slowly or fidgety/restless  0     Suicidal thoughts  0     PHQ-9 Score  0     Difficult doing work/chores  Not difficult at all      Most Recent Anxiety Screen:      No data to display         Most Recent Fall Screen:    10/20/2023    2:56 PM 05/08/2023    1:20 PM 03/03/2023    3:59 PM 10/10/2021  3:59 PM 09/27/2020    3:19 PM  Fall Risk   Falls in the past year? 0 0 0 0 0  Number falls in past yr: 0 0 0 0 0  Injury with Fall? 0 0 0 0 0  Risk for fall due to : No Fall Risks Medication side effect No Fall Risks No Fall Risks   Follow up Falls evaluation completed Falls prevention discussed;Education provided;Falls evaluation completed Falls evaluation completed Falls evaluation completed;Education provided;Falls prevention discussed     Past medical history, surgical history, medications, allergies, family history and social history reviewed with patient today and changes made to appropriate areas of the chart.  Past Medical History:  Past Medical History:  Diagnosis Date   Anxiety state, unspecified    Barrett's esophagus    Cystocele, midline     Depressive disorder, not elsewhere classified    Encounter for long-term (current) use of other medications    Glycosuria    Insomnia, unspecified    Obstructive sleep apnea (adult) (pediatric)    Oral aphthae    Other and unspecified hyperlipidemia    Proteinuria    Reflux esophagitis    Rosacea    Senile osteoporosis    STD (sexually transmitted disease)    HSV2   Unspecified essential hypertension    Unspecified hypothyroidism    Viral hepatitis A without mention of hepatic coma    Medications:  Current Outpatient Medications on File Prior to Visit  Medication Sig   b complex vitamins capsule Take 1 capsule by mouth daily.   Cranberry 450 MG CAPS Take 450 mg by mouth in the morning.   denosumab (PROLIA) 60 MG/ML SOSY injection Inject 60 mg into the skin every 6 (six) months.   docusate sodium (COLACE) 100 MG capsule Take 100 mg by mouth daily as needed for mild constipation. 1 to 4 capsules prn constipation   Estradiol 10 MCG TABS vaginal tablet Place 1 tablet (10 mcg total) vaginally 2 (two) times a week.   Ibuprofen 200 MG CAPS Take 200 mg by mouth daily as needed.   Krill Oil 500 MG CAPS Take by mouth.   levothyroxine (SYNTHROID) 100 MCG tablet Take 1 tablet (100 mcg total) by mouth daily before breakfast.   liothyronine (CYTOMEL) 5 MCG tablet Take 5 mcg by mouth daily.   loratadine-pseudoephedrine (CLARITIN-D 12 HOUR) 5-120 MG tablet Take 1 tablet by mouth 2 (two) times daily as needed.   Multiple Vitamins-Minerals (CENTRUM ADULTS) TABS Take 1 tablet by mouth daily.   omeprazole (PRILOSEC) 20 MG capsule Take 1 capsule (20 mg total) by mouth daily.   Probiotic Product (PROBIOTIC DAILY PO) Take by mouth daily.   Tdap (BOOSTRIX) 5-2.5-18.5 LF-MCG/0.5 injection    UNABLE TO FIND Take 2 capsules by mouth as needed. Med Name: Lactace enzyme 2 by mouth prn   Vitamin E 67 MG (100 UNIT) TABS Take by mouth.   No current facility-administered medications on file prior to visit.    Surgical History:  Past Surgical History:  Procedure Laterality Date   BREAST ENHANCEMENT SURGERY  2010   COSMETIC SURGERY  2009   for protruding ears   GANGLION CYST EXCISION  2011   right palm   teeth implants     tooth implant  7564,[id number]   tummy tuck     Allergies:  Allergies  Allergen Reactions   Adhesive [Tape]    Codeine Itching   Lactose Diarrhea   Lactose Intolerance (Gi)    Sulfa Antibiotics  Other (See Comments)   Family History:  Family History  Problem Relation Age of Onset   Heart attack Father    Heart attack Paternal Grandfather    Heart disease Brother    Breast cancer Mother        Objective:    BP 128/82   Pulse 68   Ht 5\' 5"  (1.651 m)   Wt 132 lb 3.2 oz (60 kg)   LMP 12/30/1999   BMI 22.00 kg/m   Wt Readings from Last 3 Encounters:  10/20/23 132 lb 3.2 oz (60 kg)  05/12/23 135 lb (61.2 kg)  04/17/23 139 lb (63 kg)    Physical Exam Vitals and nursing note reviewed.  Constitutional:      General: She is not in acute distress.    Appearance: Normal appearance.  HENT:     Head: Normocephalic and atraumatic.     Right Ear: Hearing, tympanic membrane, ear canal and external ear normal.     Left Ear: Hearing, tympanic membrane, ear canal and external ear normal.     Nose: Nose normal.     Right Sinus: No maxillary sinus tenderness or frontal sinus tenderness.     Left Sinus: No maxillary sinus tenderness or frontal sinus tenderness.     Mouth/Throat:     Lips: Pink.     Mouth: Mucous membranes are moist.     Pharynx: Oropharynx is clear.  Eyes:     General: Lids are normal. Vision grossly intact.     Extraocular Movements: Extraocular movements intact.     Conjunctiva/sclera: Conjunctivae normal.     Pupils: Pupils are equal, round, and reactive to light.     Funduscopic exam:    Right eye: Red reflex present.        Left eye: Red reflex present.    Visual Fields: Right eye visual fields normal and left eye visual fields normal.   Neck:     Thyroid: No thyromegaly.     Vascular: No carotid bruit.  Cardiovascular:     Rate and Rhythm: Normal rate and regular rhythm.     Chest Wall: PMI is not displaced.     Pulses: Normal pulses.          Dorsalis pedis pulses are 2+ on the right side and 2+ on the left side.       Posterior tibial pulses are 2+ on the right side and 2+ on the left side.     Heart sounds: Normal heart sounds. No murmur heard. Pulmonary:     Effort: Pulmonary effort is normal. No respiratory distress.     Breath sounds: Normal breath sounds.  Abdominal:     General: Abdomen is flat. Bowel sounds are normal. There is no distension.     Palpations: Abdomen is soft. There is no hepatomegaly, splenomegaly or mass.     Tenderness: There is no abdominal tenderness. There is no right CVA tenderness, left CVA tenderness, guarding or rebound.  Musculoskeletal:        General: Normal range of motion.     Cervical back: Full passive range of motion without pain, normal range of motion and neck supple. No tenderness.     Right lower leg: No edema.     Left lower leg: No edema.  Feet:     Left foot:     Toenail Condition: Left toenails are normal.  Lymphadenopathy:     Cervical: No cervical adenopathy.     Upper Body:  Right upper body: No supraclavicular adenopathy.     Left upper body: No supraclavicular adenopathy.  Skin:    General: Skin is warm and dry.     Capillary Refill: Capillary refill takes less than 2 seconds.     Nails: There is no clubbing.  Neurological:     General: No focal deficit present.     Mental Status: She is alert and oriented to person, place, and time.     GCS: GCS eye subscore is 4. GCS verbal subscore is 5. GCS motor subscore is 6.     Sensory: Sensation is intact.     Motor: Motor function is intact.     Coordination: Coordination is intact.     Gait: Gait is intact.     Deep Tendon Reflexes: Reflexes are normal and symmetric.  Psychiatric:        Attention and  Perception: Attention normal.        Mood and Affect: Mood normal.        Speech: Speech normal.        Behavior: Behavior normal. Behavior is cooperative.        Thought Content: Thought content normal.        Cognition and Memory: Cognition and memory normal.        Judgment: Judgment normal.      Results for orders placed or performed in visit on 10/20/23  CBC with Differential/Platelet  Result Value Ref Range   WBC 5.3 3.4 - 10.8 x10E3/uL   RBC 4.38 3.77 - 5.28 x10E6/uL   Hemoglobin 15.2 11.1 - 15.9 g/dL   Hematocrit 16.1 09.6 - 46.6 %   MCV 105 (H) 79 - 97 fL   MCH 34.7 (H) 26.6 - 33.0 pg   MCHC 33.2 31.5 - 35.7 g/dL   RDW 04.5 (L) 40.9 - 81.1 %   Platelets 244 150 - 450 x10E3/uL   Neutrophils 79 Not Estab. %   Lymphs 13 Not Estab. %   Monocytes 7 Not Estab. %   Eos 0 Not Estab. %   Basos 1 Not Estab. %   Neutrophils Absolute 4.2 1.4 - 7.0 x10E3/uL   Lymphocytes Absolute 0.7 0.7 - 3.1 x10E3/uL   Monocytes Absolute 0.4 0.1 - 0.9 x10E3/uL   EOS (ABSOLUTE) 0.0 0.0 - 0.4 x10E3/uL   Basophils Absolute 0.0 0.0 - 0.2 x10E3/uL   Immature Granulocytes 0 Not Estab. %   Immature Grans (Abs) 0.0 0.0 - 0.1 x10E3/uL  CMP14+EGFR  Result Value Ref Range   Glucose 99 70 - 99 mg/dL   BUN 15 8 - 27 mg/dL   Creatinine, Ser 9.14 0.57 - 1.00 mg/dL   eGFR 88 >78 GN/FAO/1.30   BUN/Creatinine Ratio 21 12 - 28   Sodium 140 134 - 144 mmol/L   Potassium 4.4 3.5 - 5.2 mmol/L   Chloride 101 96 - 106 mmol/L   CO2 21 20 - 29 mmol/L   Calcium 9.7 8.7 - 10.3 mg/dL   Total Protein 7.4 6.0 - 8.5 g/dL   Albumin 4.8 3.8 - 4.8 g/dL   Globulin, Total 2.6 1.5 - 4.5 g/dL   Bilirubin Total 0.6 0.0 - 1.2 mg/dL   Alkaline Phosphatase 57 44 - 121 IU/L   AST 23 0 - 40 IU/L   ALT 22 0 - 32 IU/L  Lipid panel  Result Value Ref Range   Cholesterol, Total 202 (H) 100 - 199 mg/dL   Triglycerides 74 0 - 149 mg/dL   HDL 75 >  39 mg/dL   VLDL Cholesterol Cal 13 5 - 40 mg/dL   LDL Chol Calc (NIH) 329 (H) 0 -  99 mg/dL   Chol/HDL Ratio 2.7 0.0 - 4.4 ratio  TSH  Result Value Ref Range   TSH 0.554 0.450 - 4.500 uIU/mL  Vitamin B12  Result Value Ref Range   Vitamin B-12 753 232 - 1,245 pg/mL  Magnesium  Result Value Ref Range   Magnesium 2.2 1.6 - 2.3 mg/dL  VITAMIN D 25 Hydroxy (Vit-D Deficiency, Fractures)  Result Value Ref Range   Vit D, 25-Hydroxy 27.7 (L) 30.0 - 100.0 ng/mL       Assessment & Plan:   Problem List Items Addressed This Visit     Senile osteoporosis    Currently on Prolia, reports feeling well on it. -Continue current regimen.      Relevant Medications   Vitamin E 67 MG (100 UNIT) TABS   Hypothyroidism    Recently added Cytomel (T3) by endocrinologist. -Continue current regimen.      Relevant Medications   liothyronine (CYTOMEL) 5 MCG tablet   Other Relevant Orders   TSH (Completed)   Reflux esophagitis    Long-term use of omeprazole, concerns about potential side effects including B12 deficiency and low white cell count. -Check B12 levels and white cell count.       Relevant Orders   CBC with Differential/Platelet (Completed)   Encounter for annual physical exam    CPE completed today. Review of HM activities and recommendations discussed and provided on AVS. Anticipatory guidance, diet, and exercise recommendations provided. Medications, allergies, and hx reviewed and updated as necessary. Orders placed as listed below.  Plan: - Labs ordered. Will make changes as necessary based on results.  - I will review these results and send recommendations via MyChart or a telephone call.  - F/U with CPE in 1 year or sooner for acute/chronic health needs as directed.   Due for colonoscopy, mammogram, and bone density scan. -Referral sent for colonoscopy. -Continue with scheduled mammogram and bone density scan at [facility].      Relevant Orders   CBC with Differential/Platelet (Completed)   CMP14+EGFR (Completed)   Lipid panel (Completed)   TSH (Completed)    Chronic leukopenia    Repeat labs pending. No alarm symptoms present at this time.       Relevant Orders   CBC with Differential/Platelet (Completed)   Hormone replacement therapy (HRT)    Annual mammogram and lab testing recommended for monitoring.       Anxiety    Chronic. History of long term benzodiazepine use with success at tapering from 8 tabs a day to 3 tabs a day. Anxiety reportedly well controlled with this dosing. At this time we will plan to continue at the current dose with strong recommendations to use as little as possible. Will trial trazodone as sleep aid to see if this is more beneficial to help her fall asleep and stay asleep throughout the night.  -Start trazodone for sleep -Avoid taking the clonazepam with the exception of anxiety/panic.       Relevant Medications   traZODone (DESYREL) 150 MG tablet   clonazePAM (KLONOPIN) 1 MG tablet   Other Relevant Orders   TSH (Completed)   Depressive disorder    Chronic. Reportedly well controlled at this time with no alarm symptoms present. Plan to start trazodone for sleep management.        Relevant Medications   traZODone (DESYREL) 150 MG tablet  Other Visit Diagnoses     Screening for colon cancer    -  Primary   Relevant Orders   Ambulatory referral to Gastroenterology   Screening mammogram for breast cancer       Relevant Orders   MM 3D SCREENING MAMMOGRAM BILATERAL BREAST   Screening for osteoporosis       Relevant Orders   DG Bone Density   Psychophysiological insomnia       Relevant Medications   traZODone (DESYREL) 150 MG tablet   High risk medication use       Relevant Orders   Vitamin B12 (Completed)   Magnesium (Completed)   VITAMIN D 25 Hydroxy (Vit-D Deficiency, Fractures) (Completed)   Localized osteoporosis ([place])       Relevant Medications   Vitamin E 67 MG (100 UNIT) TABS   Other Relevant Orders   DG Bone Density   Vitamin D deficiency, unspecified       Relevant Orders    VITAMIN D 25 Hydroxy (Vit-D Deficiency, Fractures) (Completed)         Follow up plan: Return in about 6 months (around 04/19/2024) for Med Management 30.  NEXT PREVENTATIVE PHYSICAL DUE IN 1 YEAR.  PATIENT COUNSELING PROVIDED FOR ALL ADULT PATIENTS: A well balanced diet low in saturated fats, cholesterol, and moderation in carbohydrates.  This can be as simple as monitoring portion sizes and cutting back on sugary beverages such as soda and juice to start with.    Daily water consumption of at least 64 ounces.  Physical activity at least 180 minutes per week.  If just starting out, start 10 minutes a day and work your way up.   This can be as simple as taking the stairs instead of the elevator and walking 2-3 laps around the office  purposefully every day.   STD protection, partner selection, and regular testing if high risk.  Limited consumption of alcoholic beverages if alcohol is consumed. For men, I recommend no more than 14 alcoholic beverages per week, spread out throughout the week (max 2 per day). Avoid "binge" drinking or consuming large quantities of alcohol in one setting.  Please let me know if you feel you may need help with reduction or quitting alcohol consumption.   Avoidance of nicotine, if used. Please let me know if you feel you may need help with reduction or quitting nicotine use.   Daily mental health attention. This can be in the form of 5 minute daily meditation, prayer, journaling, yoga, reflection, etc.  Purposeful attention to your emotions and mental state can significantly improve your overall wellbeing  and  Health.  Please know that I am here to help you with all of your health care goals and am happy to work with you to find a solution that works best for you.  The greatest advice I have received with any changes in life are to take it one step at a time, that even means if all you can focus on is the next 60 seconds, then do that and celebrate your  victories.  With any changes in life, you will have set backs, and that is OK. The important thing to remember is, if you have a set back, it is not a failure, it is an opportunity to try again! Screening Testing Mammogram Every 1 -2 years based on history and risk factors Starting at age 41 Pap Smear Ages 21-39 every 3 years Ages 17-65 every 5 years with HPV testing More frequent  testing may be required based on results and history Colon Cancer Screening Every 1-10 years based on test performed, risk factors, and history Starting at age 80 Bone Density Screening Every 2-10 years based on history Starting at age 4 for women Recommendations for men differ based on medication usage, history, and risk factors AAA Screening One time ultrasound Men 90-26 years old who have every smoked Lung Cancer Screening Low Dose Lung CT every 12 months Age 93-80 years with a 30 pack-year smoking history who still smoke or who have quit within the last 15 years   Screening Labs Routine  Labs: Complete Blood Count (CBC), Complete Metabolic Panel (CMP), Cholesterol (Lipid Panel) Every 6-12 months based on history and medications May be recommended more frequently based on current conditions or previous results Hemoglobin A1c Lab Every 3-12 months based on history and previous results Starting at age 37 or earlier with diagnosis of diabetes, high cholesterol, BMI >26, and/or risk factors Frequent monitoring for patients with diabetes to ensure blood sugar control Thyroid Panel (TSH) Every 6 months based on history, symptoms, and risk factors May be repeated more often if on medication HIV One time testing for all patients 19 and older May be repeated more frequently for patients with increased risk factors or exposure Hepatitis C One time testing for all patients 27 and older May be repeated more frequently for patients with increased risk factors or exposure Gonorrhea, Chlamydia Every 12  months for all sexually active persons 13-24 years Additional monitoring may be recommended for those who are considered high risk or who have symptoms Every 12 months for any woman on birth control, regardless of sexual activity PSA Men 74-68 years old with risk factors Additional screening may be recommended from age 36-69 based on risk factors, symptoms, and history  Vaccine Recommendations Tetanus Booster All adults every 10 years Flu Vaccine All patients 6 months and older every year COVID Vaccine All patients 12 years and older Initial dosing with booster May recommend additional booster based on age and health history HPV Vaccine 2 doses all patients age 4-26 Dosing may be considered for patients over 26 Shingles Vaccine (Shingrix) 2 doses all adults 55 years and older Pneumonia (Pneumovax 69) All adults 65 years and older May recommend earlier dosing based on health history One year apart from Prevnar 51 Pneumonia (Prevnar 96) All adults 65 years and older Dosed 1 year after Pneumovax 23 Pneumonia (Prevnar 20) One time alternative to the two dosing of 13 and 23 For all adults with initial dose of 23, 20 is recommended 1 year later For all adults with initial dose of 13, 23 is still recommended as second option 1 year later

## 2023-10-20 NOTE — Patient Instructions (Signed)
I have sent in the Trazodone for you. You can take this before bed and see how this works for you. If it works well, let me know and I will send in a 90 day supply. Fingers crossed.   I sent the referral to Dr. Kenna Gilbert office for colonoscopy.   I sent the referral to [facility] for the mammogram and dexa scan.

## 2023-10-21 LAB — CMP14+EGFR
ALT: 22 [IU]/L (ref 0–32)
AST: 23 [IU]/L (ref 0–40)
Albumin: 4.8 g/dL (ref 3.8–4.8)
Alkaline Phosphatase: 57 [IU]/L (ref 44–121)
BUN/Creatinine Ratio: 21 (ref 12–28)
BUN: 15 mg/dL (ref 8–27)
Bilirubin Total: 0.6 mg/dL (ref 0.0–1.2)
CO2: 21 mmol/L (ref 20–29)
Calcium: 9.7 mg/dL (ref 8.7–10.3)
Chloride: 101 mmol/L (ref 96–106)
Creatinine, Ser: 0.73 mg/dL (ref 0.57–1.00)
Globulin, Total: 2.6 g/dL (ref 1.5–4.5)
Glucose: 99 mg/dL (ref 70–99)
Potassium: 4.4 mmol/L (ref 3.5–5.2)
Sodium: 140 mmol/L (ref 134–144)
Total Protein: 7.4 g/dL (ref 6.0–8.5)
eGFR: 88 mL/min/{1.73_m2} (ref >59)

## 2023-10-21 LAB — LIPID PANEL
Chol/HDL Ratio: 2.7 ratio (ref 0.0–4.4)
Cholesterol, Total: 202 mg/dL — ABNORMAL HIGH (ref 100–199)
HDL: 75 mg/dL (ref >39)
LDL Chol Calc (NIH): 114 mg/dL — ABNORMAL HIGH (ref 0–99)
Triglycerides: 74 mg/dL (ref 0–149)
VLDL Cholesterol Cal: 13 mg/dL (ref 5–40)

## 2023-10-21 LAB — CBC WITH DIFFERENTIAL/PLATELET
Basophils Absolute: 0 10*3/uL (ref 0.0–0.2)
Basos: 1 %
EOS (ABSOLUTE): 0 10*3/uL (ref 0.0–0.4)
Eos: 0 %
Hematocrit: 45.8 % (ref 34.0–46.6)
Hemoglobin: 15.2 g/dL (ref 11.1–15.9)
Immature Grans (Abs): 0 10*3/uL (ref 0.0–0.1)
Immature Granulocytes: 0 %
Lymphocytes Absolute: 0.7 10*3/uL (ref 0.7–3.1)
Lymphs: 13 %
MCH: 34.7 pg — ABNORMAL HIGH (ref 26.6–33.0)
MCHC: 33.2 g/dL (ref 31.5–35.7)
MCV: 105 fL — ABNORMAL HIGH (ref 79–97)
Monocytes Absolute: 0.4 10*3/uL (ref 0.1–0.9)
Monocytes: 7 %
Neutrophils Absolute: 4.2 10*3/uL (ref 1.4–7.0)
Neutrophils: 79 %
Platelets: 244 10*3/uL (ref 150–450)
RBC: 4.38 x10E6/uL (ref 3.77–5.28)
RDW: 10.8 % — ABNORMAL LOW (ref 11.7–15.4)
WBC: 5.3 10*3/uL (ref 3.4–10.8)

## 2023-10-21 LAB — VITAMIN B12: Vitamin B-12: 753 pg/mL (ref 232–1245)

## 2023-10-21 LAB — TSH: TSH: 0.554 u[IU]/mL (ref 0.450–4.500)

## 2023-10-21 LAB — MAGNESIUM: Magnesium: 2.2 mg/dL (ref 1.6–2.3)

## 2023-10-21 LAB — VITAMIN D 25 HYDROXY (VIT D DEFICIENCY, FRACTURES): Vit D, 25-Hydroxy: 27.7 ng/mL — ABNORMAL LOW (ref 30.0–100.0)

## 2023-10-22 ENCOUNTER — Encounter: Payer: Medicare Other | Admitting: Orthopedic Surgery

## 2023-10-27 ENCOUNTER — Encounter: Payer: Self-pay | Admitting: Nurse Practitioner

## 2023-10-27 NOTE — Assessment & Plan Note (Signed)
CPE completed today. Review of HM activities and recommendations discussed and provided on AVS. Anticipatory guidance, diet, and exercise recommendations provided. Medications, allergies, and hx reviewed and updated as necessary. Orders placed as listed below.  Plan: - Labs ordered. Will make changes as necessary based on results.  - I will review these results and send recommendations via MyChart or a telephone call.  - F/U with CPE in 1 year or sooner for acute/chronic health needs as directed.   Due for colonoscopy, mammogram, and bone density scan. -Referral sent for colonoscopy. -Continue with scheduled mammogram and bone density scan at [facility].

## 2023-10-27 NOTE — Assessment & Plan Note (Signed)
Long-term use of omeprazole, concerns about potential side effects including B12 deficiency and low white cell count. -Check B12 levels and white cell count.

## 2023-10-27 NOTE — Assessment & Plan Note (Signed)
Annual mammogram and lab testing recommended for monitoring.

## 2023-10-27 NOTE — Assessment & Plan Note (Signed)
Chronic. History of long term benzodiazepine use with success at tapering from 8 tabs a day to 3 tabs a day. Anxiety reportedly well controlled with this dosing. At this time we will plan to continue at the current dose with strong recommendations to use as little as possible. Will trial trazodone as sleep aid to see if this is more beneficial to help her fall asleep and stay asleep throughout the night.  -Start trazodone for sleep -Avoid taking the clonazepam with the exception of anxiety/panic.

## 2023-10-27 NOTE — Assessment & Plan Note (Signed)
Recently added Cytomel (T3) by endocrinologist. -Continue current regimen.

## 2023-10-27 NOTE — Assessment & Plan Note (Signed)
Repeat labs pending. No alarm symptoms present at this time.

## 2023-10-27 NOTE — Assessment & Plan Note (Signed)
Currently on Prolia, reports feeling well on it. -Continue current regimen.

## 2023-10-27 NOTE — Assessment & Plan Note (Signed)
Chronic. Reportedly well controlled at this time with no alarm symptoms present. Plan to start trazodone for sleep management.

## 2023-10-28 ENCOUNTER — Encounter: Payer: Self-pay | Admitting: Nurse Practitioner

## 2023-10-28 ENCOUNTER — Other Ambulatory Visit: Payer: Self-pay | Admitting: Internal Medicine

## 2023-10-28 DIAGNOSIS — M810 Age-related osteoporosis without current pathological fracture: Secondary | ICD-10-CM

## 2023-10-28 DIAGNOSIS — Z1231 Encounter for screening mammogram for malignant neoplasm of breast: Secondary | ICD-10-CM | POA: Diagnosis not present

## 2023-10-28 LAB — HM MAMMOGRAPHY

## 2023-10-28 MED ORDER — DENOSUMAB 60 MG/ML ~~LOC~~ SOSY
60.0000 mg | PREFILLED_SYRINGE | Freq: Once | SUBCUTANEOUS | Status: AC
  Administered 2023-11-02: 60 mg via SUBCUTANEOUS

## 2023-10-30 ENCOUNTER — Other Ambulatory Visit: Payer: Self-pay | Admitting: Nurse Practitioner

## 2023-10-30 DIAGNOSIS — M810 Age-related osteoporosis without current pathological fracture: Secondary | ICD-10-CM

## 2023-10-30 DIAGNOSIS — E559 Vitamin D deficiency, unspecified: Secondary | ICD-10-CM

## 2023-10-30 MED ORDER — VITAMIN D (ERGOCALCIFEROL) 1.25 MG (50000 UNIT) PO CAPS: 50000.0000 [IU] | ORAL_CAPSULE | ORAL | 3 refills | Status: DC

## 2023-11-02 ENCOUNTER — Other Ambulatory Visit (INDEPENDENT_AMBULATORY_CARE_PROVIDER_SITE_OTHER): Payer: Medicare Other

## 2023-11-02 DIAGNOSIS — M810 Age-related osteoporosis without current pathological fracture: Secondary | ICD-10-CM

## 2023-11-11 ENCOUNTER — Other Ambulatory Visit: Payer: Self-pay | Admitting: Nurse Practitioner

## 2023-11-11 DIAGNOSIS — F5104 Psychophysiologic insomnia: Secondary | ICD-10-CM

## 2023-11-16 ENCOUNTER — Encounter: Payer: Self-pay | Admitting: Nurse Practitioner

## 2023-12-15 DIAGNOSIS — H52221 Regular astigmatism, right eye: Secondary | ICD-10-CM | POA: Diagnosis not present

## 2023-12-15 DIAGNOSIS — H25811 Combined forms of age-related cataract, right eye: Secondary | ICD-10-CM | POA: Diagnosis not present

## 2023-12-21 DIAGNOSIS — H2512 Age-related nuclear cataract, left eye: Secondary | ICD-10-CM | POA: Diagnosis not present

## 2023-12-21 DIAGNOSIS — Z961 Presence of intraocular lens: Secondary | ICD-10-CM | POA: Diagnosis not present

## 2023-12-29 DIAGNOSIS — H52222 Regular astigmatism, left eye: Secondary | ICD-10-CM | POA: Diagnosis not present

## 2023-12-29 DIAGNOSIS — H25812 Combined forms of age-related cataract, left eye: Secondary | ICD-10-CM | POA: Diagnosis not present

## 2024-02-08 DIAGNOSIS — E039 Hypothyroidism, unspecified: Secondary | ICD-10-CM | POA: Diagnosis not present

## 2024-02-11 DIAGNOSIS — E039 Hypothyroidism, unspecified: Secondary | ICD-10-CM | POA: Diagnosis not present

## 2024-02-16 ENCOUNTER — Other Ambulatory Visit: Payer: Self-pay | Admitting: Nurse Practitioner

## 2024-03-10 DIAGNOSIS — R8290 Unspecified abnormal findings in urine: Secondary | ICD-10-CM | POA: Diagnosis not present

## 2024-03-10 DIAGNOSIS — R300 Dysuria: Secondary | ICD-10-CM | POA: Diagnosis not present

## 2024-03-18 ENCOUNTER — Other Ambulatory Visit: Payer: Self-pay | Admitting: Internal Medicine

## 2024-03-18 DIAGNOSIS — M810 Age-related osteoporosis without current pathological fracture: Secondary | ICD-10-CM

## 2024-03-18 MED ORDER — DENOSUMAB 60 MG/ML ~~LOC~~ SOSY
60.0000 mg | PREFILLED_SYRINGE | Freq: Once | SUBCUTANEOUS | Status: AC
  Administered 2024-05-02: 60 mg via SUBCUTANEOUS

## 2024-03-18 NOTE — Progress Notes (Unsigned)
 See prolia referral

## 2024-04-06 DIAGNOSIS — Z23 Encounter for immunization: Secondary | ICD-10-CM | POA: Diagnosis not present

## 2024-04-14 ENCOUNTER — Telehealth: Payer: Self-pay

## 2024-04-14 NOTE — Telephone Encounter (Signed)
 Prolia VOB initiated via [email or web address]  Next Prolia inj DUE: 05/02/24

## 2024-04-15 NOTE — Telephone Encounter (Signed)
 Donna Ware

## 2024-04-15 NOTE — Telephone Encounter (Signed)
 Pt ready for scheduling for PROLIA  on or after : 05/02/24  Option# 1: Buy/Bill (Office supplied medication)  Out-of-pocket cost due at time of clinic visit: $0  Number of injection/visits approved: ---  Primary: MEDICARE Prolia  co-insurance: 0% Admin fee co-insurance: 0%  Secondary: [employer] FOR LIFE-MEDSUP Prolia  co-insurance: covers the Medicare Part B co-insurance and deductible. Admin fee co-insurance:   Medical Benefit Details: Date Benefits were checked: 04/15/24 Deductible: $191.19 Met of $257 Required/ Coinsurance: 0%/ Admin Fee: 0%  Prior Auth: N/A PA# Expiration Date:   # of doses approved: ----------------------------------------------------------------------- Option# 2- Med Obtained from pharmacy:  Pharmacy benefit: Copay $--- (Paid to pharmacy) Admin Fee: --- (Pay at clinic)  Prior Auth: N/A PA# Expiration Date:   # of doses approved:   If patient wants fill through the pharmacy benefit please send prescription to:  --- , and include estimated need by date in rx notes. Pharmacy will ship medication directly to the office.  Patient NOT eligible for Prolia  Copay Card. Copay Card can make patient's cost as little as $25. Link to apply: [email or web address]://[email or web address]  ** This summary of benefits is an estimation of the patient's out-of-pocket cost. Exact cost may very based on individual plan coverage.

## 2024-04-26 ENCOUNTER — Ambulatory Visit: Payer: Medicare Other | Admitting: Nurse Practitioner

## 2024-04-26 ENCOUNTER — Encounter: Payer: Self-pay | Admitting: Nurse Practitioner

## 2024-04-26 VITALS — BP 126/82 | HR 74 | Wt 133.4 lb

## 2024-04-26 DIAGNOSIS — M810 Age-related osteoporosis without current pathological fracture: Secondary | ICD-10-CM | POA: Diagnosis not present

## 2024-04-26 DIAGNOSIS — N8111 Cystocele, midline: Secondary | ICD-10-CM | POA: Diagnosis not present

## 2024-04-26 DIAGNOSIS — F419 Anxiety disorder, unspecified: Secondary | ICD-10-CM | POA: Diagnosis not present

## 2024-04-26 DIAGNOSIS — Z8639 Personal history of other endocrine, nutritional and metabolic disease: Secondary | ICD-10-CM | POA: Diagnosis not present

## 2024-04-26 DIAGNOSIS — E039 Hypothyroidism, unspecified: Secondary | ICD-10-CM

## 2024-04-26 DIAGNOSIS — F5104 Psychophysiologic insomnia: Secondary | ICD-10-CM

## 2024-04-26 DIAGNOSIS — E559 Vitamin D deficiency, unspecified: Secondary | ICD-10-CM

## 2024-04-26 DIAGNOSIS — D72819 Decreased white blood cell count, unspecified: Secondary | ICD-10-CM | POA: Diagnosis not present

## 2024-04-26 DIAGNOSIS — F32A Depression, unspecified: Secondary | ICD-10-CM

## 2024-04-26 MED ORDER — TRAZODONE HCL 150 MG PO TABS: 150.0000 mg | ORAL_TABLET | Freq: Every day | ORAL | 3 refills | Status: DC

## 2024-04-26 MED ORDER — VITAMIN D (ERGOCALCIFEROL) 1.25 MG (50000 UNIT) PO CAPS: 50000.0000 [IU] | ORAL_CAPSULE | ORAL | 3 refills | Status: AC

## 2024-04-26 MED ORDER — CLONAZEPAM 1 MG PO TABS: 1.0000 mg | ORAL_TABLET | Freq: Three times a day (TID) | ORAL | 5 refills | Status: DC

## 2024-04-26 NOTE — Assessment & Plan Note (Signed)
 Bladder prolapse post-tummy tuck with symptoms of irritation and sensation of UTI due to protrusion. Discussed non-surgical management options, including pessary use, and advised on pessary types and usage. We also discussed ways to aid in comfort. - Consider use of a pessary for prolapse management. - Use Aquaphor to maintain moisture and reduce irritation. - Follow up with gynecologist in June.

## 2024-04-26 NOTE — Assessment & Plan Note (Signed)
 Managed by endocrinologist with levothyroxine  and liothyronine. Recent dose adjustment based on blood tests. Follow-up scheduled. - Follow up with endocrinologist on May 13th for thyroid  management.

## 2024-04-26 NOTE — Progress Notes (Signed)
 [name], DNP, AGNP-c [facility]  [address] [place], [place] [address] [phone]  ESTABLISHED PATIENT- Chronic Health and/or Follow-Up Visit  Blood pressure 126/82, pulse 74, weight 133 lb 6.4 oz (60.5 kg), last menstrual period 12/30/1999.    Donna Ware is a 73 y.o. year old female presenting today for evaluation and management of chronic conditions.   She has a history of thyroid  issues and is currently taking levothyroxine  and liothyronine. Her dosage was recently adjusted based on blood test results. She visits her thyroid  specialist every six months and is scheduled to see her again soon (next week).   She experiences seasonal allergies and typically takes Claritin D, although she missed a dose this morning. Her allergies are bothersome but manageable with medication.  No reflux symptoms. She is on omeprazole , though she has occasionally forgotten to take her vitamins due to family visits.  She has a history of bladder prolapse, which causes occasional discomfort and irritation. She notes repeat external prolapse, especially during bowel movements. She has questions about pessary devices.   She underwent cataract surgery since her last visit and opted for premium lenses, which have significantly improved her vision, eliminating the need for glasses.  Her current medications include clonazepam  three times a day and trazodone  for sleep, both of which she uses regularly.  All ROS negative with exception of what is listed above.   PHYSICAL EXAM Physical Exam Vitals and nursing note reviewed.  Constitutional:      Appearance: Normal appearance.  HENT:     Head: Normocephalic.  Eyes:     Pupils: Pupils are equal, round, and reactive to light.  Cardiovascular:     Rate and Rhythm: Normal rate and regular rhythm.     Pulses: Normal pulses.     Heart sounds: Normal heart sounds.  Pulmonary:     Effort: Pulmonary effort is normal.     Breath  sounds: Normal breath sounds.  Musculoskeletal:        General: Normal range of motion.     Cervical back: Normal range of motion.  Skin:    General: Skin is warm.  Neurological:     General: No focal deficit present.     Mental Status: She is alert and oriented to person, place, and time.  Psychiatric:        Mood and Affect: Mood normal.      PLAN Problem List Items Addressed This Visit     Senile osteoporosis   Osteoporosis managed with Prolia  to prevent fractures. No recent falls reported. - Schedule Prolia  injection after May 4th. - Will recheck vitamin D  levels today.       Relevant Medications   Vitamin D , Ergocalciferol , (DRISDOL ) 1.25 MG (50000 UNIT) CAPS capsule   Other Relevant Orders   CBC with Differential/Platelet   Comprehensive metabolic panel with GFR   VITAMIN D  25 Hydroxy (Vit-D Deficiency, Fractures)   Iron, TIBC and Ferritin Panel   Hypothyroidism   Managed by endocrinologist with levothyroxine  and liothyronine. Recent dose adjustment based on blood tests. Follow-up scheduled. - Follow up with endocrinologist on May 13th for thyroid  management.       Relevant Orders   CBC with Differential/Platelet   Comprehensive metabolic panel with GFR   VITAMIN D  25 Hydroxy (Vit-D Deficiency, Fractures)   Iron, TIBC and Ferritin Panel   Chronic leukopenia - Primary   Recheck CBC      Relevant Orders   CBC with Differential/Platelet   Comprehensive metabolic  panel with GFR   VITAMIN D  25 Hydroxy (Vit-D Deficiency, Fractures)   Iron, TIBC and Ferritin Panel   CBC with Differential/Platelet   CMP14+EGFR   Anxiety   Well controlled at this time. Aaron Aas History of long term benzodiazepine use with success at tapering from 8 tabs a day to 3 tabs a day. Anxiety reportedly well controlled with this dosing. At this time we will plan to continue at the current dose with strong recommendations to use as little as possible. Will trial trazodone  as sleep aid to see if this  is more beneficial to help her fall asleep and stay asleep throughout the night.  -Start trazodone  for sleep -Avoid taking the clonazepam  with the exception of anxiety/panic.       Relevant Medications   traZODone  (DESYREL ) 150 MG tablet   clonazePAM  (KLONOPIN ) 1 MG tablet   Other Relevant Orders   CBC with Differential/Platelet   Comprehensive metabolic panel with GFR   VITAMIN D  25 Hydroxy (Vit-D Deficiency, Fractures)   Iron, TIBC and Ferritin Panel   Depressive disorder   Well controlled at this time with no alarm symptoms. Continue to monitor closely.       Relevant Medications   traZODone  (DESYREL ) 150 MG tablet   Other Relevant Orders   CBC with Differential/Platelet   Comprehensive metabolic panel with GFR   VITAMIN D  25 Hydroxy (Vit-D Deficiency, Fractures)   Iron, TIBC and Ferritin Panel   Cystocele, midline   Bladder prolapse post-tummy tuck with symptoms of irritation and sensation of UTI due to protrusion. Discussed non-surgical management options, including pessary use, and advised on pessary types and usage. We also discussed ways to aid in comfort. - Consider use of a pessary for prolapse management. - Use Aquaphor to maintain moisture and reduce irritation. - Follow up with gynecologist in June.       Relevant Orders   CBC with Differential/Platelet   Comprehensive metabolic panel with GFR   VITAMIN D  25 Hydroxy (Vit-D Deficiency, Fractures)   Iron, TIBC and Ferritin Panel   Other Visit Diagnoses       Psychophysiological insomnia       Relevant Medications   traZODone  (DESYREL ) 150 MG tablet   Other Relevant Orders   CBC with Differential/Platelet   Comprehensive metabolic panel with GFR   VITAMIN D  25 Hydroxy (Vit-D Deficiency, Fractures)   Iron, TIBC and Ferritin Panel     Vitamin D  deficiency       Relevant Medications   Vitamin D , Ergocalciferol , (DRISDOL ) 1.25 MG (50000 UNIT) CAPS capsule   Other Relevant Orders   CBC with  Differential/Platelet   Comprehensive metabolic panel with GFR   VITAMIN D  25 Hydroxy (Vit-D Deficiency, Fractures)   Iron, TIBC and Ferritin Panel   Vitamin D , 25-hydroxy     History of iron deficiency       Relevant Orders   Iron, TIBC and Ferritin Panel       No follow-ups on file. Time: 44 minutes, >50% spent counseling, care coordination, chart review, and documentation.   [name], DNP, AGNP-c

## 2024-04-26 NOTE — Assessment & Plan Note (Signed)
 Recheck CBC.

## 2024-04-26 NOTE — Patient Instructions (Addendum)
 Reflux: You can start taking the omeprazole  every other day if you would like to see if you still have control of your reflux symptoms. If they start back then you can go back to daily dosing.   Schedule the Prolia  after May 4.

## 2024-04-26 NOTE — Assessment & Plan Note (Addendum)
 Osteoporosis managed with Prolia  to prevent fractures. No recent falls reported. - Schedule Prolia  injection after May 4th. - Will recheck vitamin D  levels today.

## 2024-04-26 NOTE — Assessment & Plan Note (Signed)
 Well controlled at this time with no alarm symptoms. Continue to monitor closely.

## 2024-04-26 NOTE — Assessment & Plan Note (Signed)
 Well controlled at this time. Donna Ware History of long term benzodiazepine use with success at tapering from 8 tabs a day to 3 tabs a day. Anxiety reportedly well controlled with this dosing. At this time we will plan to continue at the current dose with strong recommendations to use as little as possible. Will trial trazodone  as sleep aid to see if this is more beneficial to help her fall asleep and stay asleep throughout the night.  -Start trazodone  for sleep -Avoid taking the clonazepam  with the exception of anxiety/panic.

## 2024-04-27 LAB — CBC WITH DIFFERENTIAL/PLATELET
Basophils Absolute: 0 10*3/uL (ref 0.0–0.2)
Basos: 0 %
EOS (ABSOLUTE): 0 10*3/uL (ref 0.0–0.4)
Eos: 0 %
Hematocrit: 45.2 % (ref 34.0–46.6)
Hemoglobin: 15.1 g/dL (ref 11.1–15.9)
Immature Grans (Abs): 0 10*3/uL (ref 0.0–0.1)
Immature Granulocytes: 0 %
Lymphocytes Absolute: 0.5 10*3/uL — ABNORMAL LOW (ref 0.7–3.1)
Lymphs: 5 %
MCH: 33.6 pg — ABNORMAL HIGH (ref 26.6–33.0)
MCHC: 33.4 g/dL (ref 31.5–35.7)
MCV: 101 fL — ABNORMAL HIGH (ref 79–97)
Monocytes Absolute: 0.6 10*3/uL (ref 0.1–0.9)
Monocytes: 5 %
Neutrophils Absolute: 9.7 10*3/uL — ABNORMAL HIGH (ref 1.4–7.0)
Neutrophils: 90 %
Platelets: 277 10*3/uL (ref 150–450)
RBC: 4.49 x10E6/uL (ref 3.77–5.28)
RDW: 10.7 % — ABNORMAL LOW (ref 11.7–15.4)
WBC: 10.8 10*3/uL (ref 3.4–10.8)

## 2024-04-27 LAB — CMP14+EGFR
ALT: 12 IU/L (ref 0–32)
AST: 16 IU/L (ref 0–40)
Albumin: 4.9 g/dL — ABNORMAL HIGH (ref 3.8–4.8)
Alkaline Phosphatase: 59 IU/L (ref 44–121)
BUN/Creatinine Ratio: 15 (ref 12–28)
BUN: 12 mg/dL (ref 8–27)
Bilirubin Total: 0.3 mg/dL (ref 0.0–1.2)
CO2: 21 mmol/L (ref 20–29)
Calcium: 10.4 mg/dL — ABNORMAL HIGH (ref 8.7–10.3)
Chloride: 101 mmol/L (ref 96–106)
Creatinine, Ser: 0.81 mg/dL (ref 0.57–1.00)
Globulin, Total: 2.3 g/dL (ref 1.5–4.5)
Glucose: 92 mg/dL (ref 70–99)
Potassium: 4.6 mmol/L (ref 3.5–5.2)
Sodium: 138 mmol/L (ref 134–144)
Total Protein: 7.2 g/dL (ref 6.0–8.5)
eGFR: 77 mL/min/{1.73_m2} (ref >59)

## 2024-04-27 LAB — IRON,TIBC AND FERRITIN PANEL
Ferritin: 100 ng/mL (ref 15–150)
Iron Saturation: 20 % (ref 15–55)
Iron: 66 ug/dL (ref 27–139)
Total Iron Binding Capacity: 331 ug/dL (ref 250–450)
UIBC: 265 ug/dL (ref 118–369)

## 2024-04-27 LAB — VITAMIN D 25 HYDROXY (VIT D DEFICIENCY, FRACTURES): Vit D, 25-Hydroxy: 46.6 ng/mL (ref 30.0–100.0)

## 2024-05-02 ENCOUNTER — Other Ambulatory Visit: Payer: Self-pay | Admitting: Internal Medicine

## 2024-05-02 ENCOUNTER — Other Ambulatory Visit (INDEPENDENT_AMBULATORY_CARE_PROVIDER_SITE_OTHER)

## 2024-05-02 ENCOUNTER — Telehealth: Payer: Self-pay | Admitting: Internal Medicine

## 2024-05-02 DIAGNOSIS — M810 Age-related osteoporosis without current pathological fracture: Secondary | ICD-10-CM | POA: Diagnosis not present

## 2024-05-02 DIAGNOSIS — Z23 Encounter for immunization: Secondary | ICD-10-CM | POA: Diagnosis not present

## 2024-05-02 NOTE — Telephone Encounter (Signed)
 Pneumonia 20 approved to be given per [facility]   Copied from CRM [id number]. Topic: Clinical - Medication Question >> May 02, 2024  2:07 PM Carlatta H wrote: Reason for CRM: Patient would like to know if her Prolia  shot can be taken out of the refrigerator and she would also like to have her updated pneumonia shot.Aaron AasAaron Aas

## 2024-05-05 ENCOUNTER — Encounter: Payer: Self-pay | Admitting: Nurse Practitioner

## 2024-05-09 DIAGNOSIS — E039 Hypothyroidism, unspecified: Secondary | ICD-10-CM | POA: Diagnosis not present

## 2024-05-10 DIAGNOSIS — N3000 Acute cystitis without hematuria: Secondary | ICD-10-CM | POA: Diagnosis not present

## 2024-05-10 DIAGNOSIS — R300 Dysuria: Secondary | ICD-10-CM | POA: Diagnosis not present

## 2024-05-16 ENCOUNTER — Ambulatory Visit: Payer: Self-pay

## 2024-05-17 ENCOUNTER — Ambulatory Visit (INDEPENDENT_AMBULATORY_CARE_PROVIDER_SITE_OTHER): Payer: Medicare Other

## 2024-05-17 DIAGNOSIS — Z0000 Encounter for general adult medical examination without abnormal findings: Secondary | ICD-10-CM

## 2024-05-17 DIAGNOSIS — E039 Hypothyroidism, unspecified: Secondary | ICD-10-CM | POA: Diagnosis not present

## 2024-05-17 NOTE — Patient Instructions (Signed)
 Donna Ware , Thank you for taking time out of your busy schedule to complete your Annual Wellness Visit with me. I enjoyed our conversation and look forward to speaking with you again next year. I, as well as your care team,  appreciate your ongoing commitment to your health goals. Please review the following plan we discussed and let me know if I can assist you in the future. Your Game plan/ To Do List    Referrals: If you haven't heard from the office you've been referred to, please reach out to them at the phone provided.  N/a Follow up Visits: Next Medicare AWV with our clinical staff: 05/23/2025 at 4:10   Have you seen your provider in the last 6 months (3 months if uncontrolled diabetes)? Yes Next Office Visit with your provider: 11/14/2024 at 3:45  Clinician Recommendations:  Aim for 30 minutes of exercise or brisk walking, 6-8 glasses of water, and 5 servings of fruits and vegetables each day.       This is a list of the screening recommended for you and due dates:  Health Maintenance  Topic Date Due   COVID-19 Vaccine (9 - Pfizer risk 2024-25 season) 03/08/2024   Colon Cancer Screening  08/24/2024   Flu Shot  07/29/2024   Medicare Annual Wellness Visit  05/17/2025   Mammogram  10/27/2025   DTaP/Tdap/Td vaccine (5 - Td or Tdap) 04/30/2033   Pneumonia Vaccine  Completed   DEXA scan (bone density measurement)  Completed   Hepatitis C Screening  Completed   Zoster (Shingles) Vaccine  Completed   HPV Vaccine  Aged Out   Meningitis B Vaccine  Aged Out    Advanced directives: (In Chart) A copy of your advanced directives are scanned into your chart should your provider ever need it. Advance Care Planning is important because it:  [x]  Makes sure you receive the medical care that is consistent with your values, goals, and preferences  [x]  It provides guidance to your family and loved ones and reduces their decisional burden about whether or not they are making the right decisions  based on your wishes.  Follow the link provided in your after visit summary or read over the paperwork we have mailed to you to help you started getting your Advance Directives in place. If you need assistance in completing these, please reach out to us  so that we can help you!  See attachments for Preventive Care and Fall Prevention Tips.

## 2024-05-17 NOTE — Progress Notes (Signed)
 Subjective:   Donna Ware is a 73 y.o. who presents for a Medicare Wellness preventive visit.  As a reminder, Annual Wellness Visits don't include a physical exam, and some assessments may be limited, especially if this visit is performed virtually. We may recommend an in-person follow-up visit with your provider if needed.  Visit Complete: Virtual I connected with  Donna Ware on 05/17/24 by a audio enabled telemedicine application and verified that I am speaking with the correct person using two identifiers.  Patient Location: Home  Provider Location: Office/Clinic  I discussed the limitations of evaluation and management by telemedicine. The patient expressed understanding and agreed to proceed.  Vital Signs: Because this visit was a virtual/telehealth visit, some criteria may be missing or patient reported. Any vitals not documented were not able to be obtained and vitals that have been documented are patient reported.  VideoError- Librarian, academic were attempted between this provider and patient, however failed, due to patient having technical difficulties OR patient did not have access to video capability.  We continued and completed visit with audio only.   Persons Participating in Visit: Patient.  AWV Questionnaire: No: Patient Medicare AWV questionnaire was not completed prior to this visit.  Cardiac Risk Factors include: advanced age (>5men, >14 women)     Objective:     Today's Vitals   There is no height or weight on file to calculate BMI.     05/17/2024    4:30 PM 05/12/2023    4:05 PM 10/16/2022   11:53 AM 09/27/2020    3:20 PM 09/14/2020    3:41 PM 03/22/2020    3:25 PM 09/12/2019    2:41 PM  Advanced Directives  Does Patient Have a Medical Advance Directive? Yes Yes Yes Yes Yes Yes Yes  Type of Estate agent of [place];Living will Healthcare Power of [place];Living will Healthcare Power of  [place];Living will Healthcare Power of [place];Living will;Out of facility DNR (pink MOST or yellow form) Living will;Healthcare Power of [employer] of [place];Living will Healthcare Power of [place];Living will  Does patient want to make changes to medical advance directive?   No - Patient declined No - Patient declined No - Patient declined No - Guardian declined No - Patient declined  Copy of Healthcare Power of Attorney in Chart? Yes - validated most recent copy scanned in chart (See row information) Yes - validated most recent copy scanned in chart (See row information) Yes - validated most recent copy scanned in chart (See row information) Yes - validated most recent copy scanned in chart (See row information) No - copy requested Yes - validated most recent copy scanned in chart (See row information) Yes - validated most recent copy scanned in chart (See row information)    Current Medications (verified) Outpatient Encounter Medications as of 05/17/2024  Medication Sig   b complex vitamins capsule Take 1 capsule by mouth daily.   clonazePAM  (KLONOPIN ) 1 MG tablet Take 1 tablet (1 mg total) by mouth 3 (three) times daily. for anxiety   Cranberry 450 MG CAPS Take 450 mg by mouth in the morning.   docusate sodium (COLACE) 100 MG capsule Take 100 mg by mouth daily as needed for mild constipation. 1 to 4 capsules prn constipation   Estradiol  10 MCG TABS vaginal tablet Place 1 tablet (10 mcg total) vaginally 2 (two) times a week.   Ibuprofen 200 MG CAPS Take 200 mg by mouth daily as needed.  Krill Oil 500 MG CAPS Take by mouth.   levothyroxine  (SYNTHROID ) 100 MCG tablet Take 1 tablet (100 mcg total) by mouth daily before breakfast.   liothyronine (CYTOMEL) 5 MCG tablet Take 5 mcg by mouth daily.   loratadine-pseudoephedrine (CLARITIN-D 12 HOUR) 5-120 MG tablet Take 1 tablet by mouth 2 (two) times daily as needed.   Multiple Vitamins-Minerals (CENTRUM ADULTS) TABS Take 1 tablet  by mouth daily.   omeprazole  (PRILOSEC) 20 MG capsule TAKE 1 CAPSULE BY MOUTH EVERY DAY (Patient taking differently: Take by mouth daily. Every other day)   Probiotic Product (PROBIOTIC DAILY PO) Take by mouth daily.   UNABLE TO FIND Take 2 capsules by mouth as needed. Med Name: Lactace enzyme 2 by mouth prn   Vitamin D , Ergocalciferol , (DRISDOL ) 1.25 MG (50000 UNIT) CAPS capsule Take 1 capsule (50,000 Units total) by mouth every 7 (seven) days.   Vitamin E 67 MG (100 UNIT) TABS Take by mouth.   traZODone  (DESYREL ) 150 MG tablet Take 1 tablet (150 mg total) by mouth at bedtime. For Sleep (Patient not taking: Reported on 05/17/2024)   No facility-administered encounter medications on file as of 05/17/2024.    Allergies (verified) Adhesive [tape], Codeine, Lactose, Lactose intolerance (gi), and Sulfa antibiotics   History: Past Medical History:  Diagnosis Date   Anxiety state, unspecified    Barrett's esophagus    Cystocele, midline    Depressive disorder, not elsewhere classified    Encounter for long-term (current) use of other medications    Glycosuria    Insomnia, unspecified    Oral aphthae    Other and unspecified hyperlipidemia    Proteinuria    Reflux esophagitis    Rosacea    Senile osteoporosis    STD (sexually transmitted disease)    HSV2   Unspecified essential hypertension    Unspecified hypothyroidism    Viral hepatitis A without mention of hepatic coma    Past Surgical History:  Procedure Laterality Date   BREAST ENHANCEMENT SURGERY  2010   CATARACT EXTRACTION Bilateral    01/2024   COSMETIC SURGERY  2009   for protruding ears   GANGLION CYST EXCISION  2011   right palm   teeth implants     tooth implant  4098,[id number]   tummy tuck     Family History  Problem Relation Age of Onset   Heart attack Father    Heart attack Paternal Grandfather    Heart disease Brother    Breast cancer Mother    Social History   Socioeconomic History   Marital status:  Legally Separated    Spouse name: Not on file   Number of children: Not on file   Years of education: Not on file   Highest education level: Not on file  Occupational History   Not on file  Tobacco Use   Smoking status: Never   Smokeless tobacco: Never  Vaping Use   Vaping status: Never Used  Substance and Sexual Activity   Alcohol use: Not Currently   Drug use: No   Sexual activity: Yes    Partners: Male    Birth control/protection: Post-menopausal    Comment: >5 partners, >16 y/o, HSV2  Other Topics Concern   Not on file  Social History Narrative   Separated   Significant other for 10 years    Social Drivers of Corporate investment banker Strain: Low Risk  (05/17/2024)   Overall Financial Resource Strain (CARDIA)    Difficulty of Paying  Living Expenses: Not hard at all  Food Insecurity: No Food Insecurity (05/17/2024)   Hunger Vital Sign    Worried About Running Out of Food in the Last Year: Never true    Ran Out of Food in the Last Year: Never true  Transportation Needs: No Transportation Needs (05/17/2024)   PRAPARE - Administrator, Civil Service (Medical): No    Lack of Transportation (Non-Medical): No  Physical Activity: Sufficiently Active (05/17/2024)   Exercise Vital Sign    Days of Exercise per Week: 4 days    Minutes of Exercise per Session: 60 min  Stress: Stress Concern Present (05/17/2024)   [employer] of Occupational Health - Occupational Stress Questionnaire    Feeling of Stress : To some extent  Social Connections: Socially Isolated (05/17/2024)   Social Connection and Isolation Panel [NHANES]    Frequency of Communication with Friends and Family: Twice a week    Frequency of Social Gatherings with Friends and Family: Once a week    Attends Religious Services: Never    Database administrator or Organizations: No    Attends Engineer, structural: Never    Marital Status: Separated    Tobacco Counseling Counseling given: Not  Answered    Clinical Intake:  Pre-visit preparation completed: Yes  Pain : No/denies pain     Nutritional Risks: None Diabetes: No  Lab Results  Component Value Date   HGBA1C 4.8 09/20/2020   HGBA1C 4.9 09/12/2019   HGBA1C 4.8 05/17/2018     How often do you need to have someone help you when you read instructions, pamphlets, or other written materials from your doctor or pharmacy?: 1 - Never  Interpreter Needed?: No  Information entered by :: NAllen LPN   Activities of Daily Living     05/17/2024    4:23 PM  In your present state of health, do you have any difficulty performing the following activities:  Hearing? 0  Vision? 0  Difficulty concentrating or making decisions? 0  Walking or climbing stairs? 0  Dressing or bathing? 0  Doing errands, shopping? 0  Preparing Food and eating ? N  Using the Toilet? N  In the past six months, have you accidently leaked urine? N  Do you have problems with loss of bowel control? N  Managing your Medications? N  Managing your Finances? N  Housekeeping or managing your Housekeeping? N    Patient Care Team: Early, Adriane Albe, NP as PCP - General (Nurse Practitioner) [place], Heather, OD (Optometry)  Indicate any recent Medical Services you may have received from other than [facility] providers in the past year (date may be approximate).     Assessment:    This is a routine wellness examination for Donna Ware.  Hearing/Vision screen Hearing Screening - Comments:: Denies hearing issues Vision Screening - Comments:: Regular eye exams, [facility]   Goals Addressed             This Visit's Progress    Patient Stated       05/17/2024, maintaining weight       Depression Screen     05/17/2024    4:33 PM 04/26/2024    3:18 PM 10/20/2023    2:58 PM 05/12/2023    4:07 PM 03/03/2023    3:59 PM 10/10/2021    4:01 PM 09/27/2020    3:19 PM  PHQ 2/9 Scores  PHQ - 2 Score 0 0 0 0 0 0 0  PHQ-  9 Score 0   0       Fall Risk      05/17/2024    4:31 PM 04/26/2024    3:18 PM 10/20/2023    2:56 PM 05/08/2023    1:20 PM 03/03/2023    3:59 PM  Fall Risk   Falls in the past year? 0 0 0 0 0  Number falls in past yr: 0 0 0 0 0  Injury with Fall? 0 0 0 0 0  Risk for fall due to : Medication side effect No Fall Risks No Fall Risks Medication side effect No Fall Risks  Follow up Falls evaluation completed;Falls prevention discussed Falls evaluation completed Falls evaluation completed Falls prevention discussed;Education provided;Falls evaluation completed Falls evaluation completed    MEDICARE RISK AT HOME:  Medicare Risk at Home Any stairs in or around the home?: Yes If so, are there any without handrails?: No Home free of loose throw rugs in walkways, pet beds, electrical cords, etc?: Yes Adequate lighting in your home to reduce risk of falls?: Yes Life alert?: No Use of a cane, walker or w/c?: No Grab bars in the bathroom?: Yes Shower chair or bench in shower?: No Elevated toilet seat or a handicapped toilet?: No  TIMED UP AND GO:  Was the test performed?  No  Cognitive Function: 6CIT completed    09/12/2019    2:42 PM 07/05/2018    1:08 PM 06/18/2017    1:52 PM  MMSE - Mini Mental State Exam  Orientation to time 5 5 5   Orientation to Place 5 5 5   Registration 3 3 3   Attention/ Calculation 5 5 5   Recall 3 3 3   Language- name 2 objects 2 2 2   Language- repeat 1 1 1   Language- follow 3 step command 3 3 3   Language- read & follow direction 1 1 1   Write a sentence 1 1 1   Copy design 1 1 1   Total score 30 30 30         05/17/2024    4:33 PM 05/12/2023    4:08 PM 09/14/2020    3:36 PM  6CIT Screen  What Year? 0 points 0 points 0 points  What month? 0 points 0 points 0 points  What time? 0 points 0 points 0 points  Count back from 20 0 points 0 points 2 points  Months in reverse 0 points 0 points 0 points  Repeat phrase 0 points 0 points 0 points  Total Score 0 points 0 points 2 points     Immunizations Immunization History  Administered Date(s) Administered   Fluad Quad(high Dose 65+) 09/12/2019, 09/27/2020, 09/23/2022   Fluad Trivalent(High Dose 65+) 09/23/2023   Influenza, High Dose Seasonal PF 09/15/2017, 09/20/2018   Influenza,inj,Quad PF,6+ Mos 09/14/2015, 08/28/2016   Influenza-Unspecified 09/21/2012, 09/23/2023   PFIZER(Purple Top)SARS-COV-2 Vaccination 02/09/2020, 03/05/2020, 09/21/2020, 04/01/2021   PNEUMOCOCCAL CONJUGATE-20 05/02/2024   Pfizer Covid-19 Vaccine Bivalent Booster 33yrs & up 10/28/2021, 04/29/2022, 10/03/2022   Pfizer(Comirnaty)Fall Seasonal Vaccine 12 years and older 09/09/2023   Pneumococcal Conjugate-13 06/18/2017   Pneumococcal Polysaccharide-23 12/29/1994, 07/05/2018   Respiratory Syncytial Virus Vaccine,Recomb Aduvanted(Arexvy) 09/09/2022   Td 04/08/2021   Tdap 12/29/1994, 11/25/2011, 05/01/2023   Zoster Recombinant(Shingrix) 09/09/2017, 12/17/2017   Zoster, Live 03/15/2008    Screening Tests Health Maintenance  Topic Date Due   COVID-19 Vaccine (9 - Pfizer risk 2024-25 season) 03/08/2024   Colonoscopy  08/24/2024   INFLUENZA VACCINE  07/29/2024   Medicare Annual Wellness (AWV)  05/17/2025  MAMMOGRAM  10/27/2025   DTaP/Tdap/Td (5 - Td or Tdap) 04/30/2033   Pneumonia Vaccine 28+ Years old  Completed   DEXA SCAN  Completed   Hepatitis C Screening  Completed   Zoster Vaccines- Shingrix  Completed   HPV VACCINES  Aged Out   Meningococcal B Vaccine  Aged Out    Health Maintenance  Health Maintenance Due  Topic Date Due   COVID-19 Vaccine (9 - Pfizer risk 2024-25 season) 03/08/2024   Colonoscopy  08/24/2024   Health Maintenance Items Addressed: Up to date  Additional Screening:  Vision Screening: Recommended annual ophthalmology exams for early detection of glaucoma and other disorders of the eye.  Dental Screening: Recommended annual dental exams for proper oral hygiene  Community Resource Referral / Chronic Care  Management: CRR required this visit?  No   CCM required this visit?  No   Plan:    I have personally reviewed and noted the following in the patient's chart:   Medical and social history Use of alcohol, tobacco or illicit drugs  Current medications and supplements including opioid prescriptions. Patient is not currently taking opioid prescriptions. Functional ability and status Nutritional status Physical activity Advanced directives List of other physicians Hospitalizations, surgeries, and ER visits in previous 12 months Vitals Screenings to include cognitive, depression, and falls Referrals and appointments  In addition, I have reviewed and discussed with patient certain preventive protocols, quality metrics, and best practice recommendations. A written personalized care plan for preventive services as well as general preventive health recommendations were provided to patient.   Areatha Beecham, LPN   [id number]/62/[id number]   After Visit Summary: (MyChart) Due to this being a telephonic visit, the after visit summary with patients personalized plan was offered to patient via MyChart   Notes: Nothing significant to report at this time.

## 2024-06-15 ENCOUNTER — Encounter: Payer: Self-pay | Admitting: Nurse Practitioner

## 2024-06-22 ENCOUNTER — Ambulatory Visit (INDEPENDENT_AMBULATORY_CARE_PROVIDER_SITE_OTHER): Payer: Medicare Other | Admitting: Obstetrics and Gynecology

## 2024-06-22 ENCOUNTER — Encounter: Payer: Self-pay | Admitting: Obstetrics and Gynecology

## 2024-06-22 ENCOUNTER — Telehealth: Payer: Self-pay | Admitting: Pharmacy Technician

## 2024-06-22 ENCOUNTER — Encounter: Payer: Self-pay | Admitting: Nurse Practitioner

## 2024-06-22 VITALS — BP 122/82 | HR 82 | Ht 66.0 in | Wt 142.0 lb

## 2024-06-22 DIAGNOSIS — Z5181 Encounter for therapeutic drug level monitoring: Secondary | ICD-10-CM

## 2024-06-22 DIAGNOSIS — N952 Postmenopausal atrophic vaginitis: Secondary | ICD-10-CM | POA: Diagnosis not present

## 2024-06-22 DIAGNOSIS — Z803 Family history of malignant neoplasm of breast: Secondary | ICD-10-CM

## 2024-06-22 DIAGNOSIS — B009 Herpesviral infection, unspecified: Secondary | ICD-10-CM | POA: Diagnosis not present

## 2024-06-22 DIAGNOSIS — Z01419 Encounter for gynecological examination (general) (routine) without abnormal findings: Secondary | ICD-10-CM

## 2024-06-22 DIAGNOSIS — Z9289 Personal history of other medical treatment: Secondary | ICD-10-CM

## 2024-06-22 MED ORDER — ESTRADIOL 10 MCG VA TABS
1.0000 | ORAL_TABLET | VAGINAL | 3 refills | Status: AC
Start: 2024-06-23 — End: ?

## 2024-06-22 NOTE — Telephone Encounter (Signed)
 Auth Submission: NO AUTH NEEDED Site of care: Site of care: [facility] Payer: MEDICARE / TRICARE Medication & CPT/J Code(s) submitted: Prolia  (Denosumab ) [id number] Diagnosis Code:  Route of submission (phone, fax, portal):  Phone # Fax # Auth type: Buy/Bill HB Units/visits requested: 60MG  Q6M X2 DOSES Reference number:  Approval from: 06/22/24 to 12/28/24

## 2024-06-22 NOTE — Progress Notes (Addendum)
 73 y.o. G0P0000 Legally Separated Caucasian female here for a breast and pelvic exam.    The patient is also followed for vaginal atrophy.  Uses Vagifem . Helps with sexual functioning.   Hx rectocele.  Feels a bulge when she pushes.  No constipation.  No accidental leakage of stool. Voiding well, but can need to strain to start the flow of urine.  UTI 1 - 2 times a year.  No urinary incontinence.   Asking about a pessary.   PCP: Oris Camie BRAVO, NP   Patient's last menstrual period was 12/30/1999.           Sexually active: Yes.    The current method of family planning is post menopausal status.    Menopausal hormone therapy:  Estradiol  Vagifem . Exercising: Yes.    Walking, yoga, weight lifting  Smoker:  no  OB History     Gravida  0   Para  0   Term  0   Preterm  0   AB  0   Living  0      SAB  0   IAB  0   Ectopic  0   Multiple  0   Live Births  0           HEALTH MAINTENANCE: Last 2 paps: 04/17/23 neg, 07/25/19 neg HPV neg History of abnormal Pap or positive HPV:  no Mammogram:  10/28/23 - BI-RADS1, cat C Colonoscopy:  08/24/14 Bone Density:  10/23/22  Result  osteoporosis - PCP following.   Immunization History  Administered Date(s) Administered   Fluad Quad(high Dose 65+) 09/12/2019, 09/27/2020, 09/23/2022   Fluad Trivalent(High Dose 65+) 09/23/2023   Influenza, High Dose Seasonal PF 09/15/2017, 09/20/2018   Influenza,inj,Quad PF,6+ Mos 09/14/2015, 08/28/2016   Influenza-Unspecified 09/21/2012, 09/23/2023   PFIZER(Purple Top)SARS-COV-2 Vaccination 02/09/2020, 03/05/2020, 09/21/2020, 04/01/2021   PNEUMOCOCCAL CONJUGATE-20 05/02/2024   Pfizer Covid-19 Vaccine Bivalent Booster 80yrs & up 10/28/2021, 04/29/2022, 10/03/2022   Pfizer(Comirnaty)Fall Seasonal Vaccine 12 years and older 09/09/2023   Pneumococcal Conjugate-13 06/18/2017   Pneumococcal Polysaccharide-23 12/29/1994, 07/05/2018   Respiratory Syncytial Virus Vaccine,Recomb  Aduvanted(Arexvy) 09/09/2022   Td 04/08/2021   Tdap 12/29/1994, 11/25/2011, 05/01/2023   Zoster Recombinant(Shingrix) 09/09/2017, 12/17/2017   Zoster, Live 03/15/2008      reports that she has never smoked. She has never used smokeless tobacco. She reports that she does not currently use alcohol. She reports that she does not use drugs.  Past Medical History:  Diagnosis Date   Anxiety state, unspecified    Barrett's esophagus    Cystocele, midline    Depressive disorder, not elsewhere classified    Encounter for long-term (current) use of other medications    Glycosuria    Insomnia, unspecified    Oral aphthae    Other and unspecified hyperlipidemia    Proteinuria    Reflux esophagitis    Rosacea    Senile osteoporosis    STD (sexually transmitted disease)    HSV2   Unspecified essential hypertension    Unspecified hypothyroidism    Viral hepatitis A without mention of hepatic coma     Past Surgical History:  Procedure Laterality Date   ABDOMINOPLASTY     BREAST ENHANCEMENT SURGERY  2010   CATARACT EXTRACTION Bilateral    01/2024   COSMETIC SURGERY  2009   for protruding ears   GANGLION CYST EXCISION  2011   right palm   teeth implants     tooth implant  [id number]   tummy  tuck      Current Outpatient Medications  Medication Sig Dispense Refill   b complex vitamins capsule Take 1 capsule by mouth daily.     clonazePAM  (KLONOPIN ) 1 MG tablet Take 1 tablet (1 mg total) by mouth 3 (three) times daily. for anxiety 90 tablet 5   Cranberry 450 MG CAPS Take 450 mg by mouth in the morning.     denosumab  (PROLIA ) 60 MG/ML SOSY injection Inject 60 mg into the skin every 6 (six) months.     docusate sodium (COLACE) 100 MG capsule Take 100 mg by mouth daily as needed for mild constipation. 1 to 4 capsules prn constipation     Estradiol  10 MCG TABS vaginal tablet Place 1 tablet (10 mcg total) vaginally 2 (two) times a week. 24 tablet 3   Ibuprofen 200 MG CAPS Take 200 mg by  mouth daily as needed.     Krill Oil 500 MG CAPS Take by mouth.     levothyroxine  (SYNTHROID ) 100 MCG tablet Take 1 tablet (100 mcg total) by mouth daily before breakfast. 90 tablet 3   liothyronine (CYTOMEL) 5 MCG tablet Take 5 mcg by mouth daily.     loratadine-pseudoephedrine (CLARITIN-D 12 HOUR) 5-120 MG tablet Take 1 tablet by mouth 2 (two) times daily as needed.     Multiple Vitamins-Minerals (CENTRUM ADULTS) TABS Take 1 tablet by mouth daily.     omeprazole  (PRILOSEC) 20 MG capsule TAKE 1 CAPSULE BY MOUTH EVERY DAY (Patient taking differently: Take by mouth daily. Every other day) 90 capsule 1   Probiotic Product (PROBIOTIC DAILY PO) Take by mouth daily.     UNABLE TO FIND Take 2 capsules by mouth as needed. Med Name: Lactace enzyme 2 by mouth prn     Vitamin D , Ergocalciferol , (DRISDOL ) 1.25 MG (50000 UNIT) CAPS capsule Take 1 capsule (50,000 Units total) by mouth every 7 (seven) days. 12 capsule 3   Vitamin E 67 MG (100 UNIT) TABS Take by mouth.     traZODone  (DESYREL ) 150 MG tablet Take 1 tablet (150 mg total) by mouth at bedtime. For Sleep (Patient not taking: Reported on 06/22/2024) 90 tablet 3   No current facility-administered medications for this visit.    ALLERGIES: Adhesive [tape], Codeine, Lactose, Lactose intolerance (gi), and Sulfa antibiotics  Family History  Problem Relation Age of Onset   Breast cancer Mother    Heart attack Father    Heart disease Brother    Breast cancer Maternal Aunt    Heart attack Paternal Grandfather     Review of Systems  All other systems reviewed and are negative.   PHYSICAL EXAM:  BP 122/82 (BP Location: Left Arm, Patient Position: Sitting)   Pulse 82   Ht 5' 6 (1.676 m)   Wt 142 lb (64.4 kg)   LMP 12/30/1999   SpO2 98%   BMI 22.92 kg/m     General appearance: alert, cooperative and appears stated age Head: normocephalic, without obvious abnormality, atraumatic Neck: no adenopathy, supple, symmetrical, trachea midline and  thyroid  normal to inspection and palpation Lungs: clear to auscultation bilaterally Breasts: normal appearance, no masses or tenderness, No nipple retraction or dimpling, No nipple discharge or bleeding, No axillary adenopathy Heart: regular rate and rhythm Abdomen: soft, non-tender; no masses, no organomegaly Extremities: extremities normal, atraumatic, no cyanosis or edema Skin: skin color, texture, turgor normal. No rashes or lesions Lymph nodes: cervical, supraclavicular, and axillary nodes normal. Neurologic: grossly normal  Pelvic: External genitalia:  no lesions  No abnormal inguinal nodes palpated.              Urethra:  normal appearing urethra with no masses, tenderness or lesions              Bartholins and Skenes: normal                 Vagina: normal appearing vagina with normal color and discharge, no lesions.  Good anterior, apical and posterior vaginal support.               Cervix: no lesions              Pap taken: no Bimanual Exam:  Uterus:  normal size, contour, position, consistency, mobility, non-tender              Adnexa: no mass, fullness, tenderness              Rectal exam: yes.  Confirms.              Anus:  normal sphincter tone, no lesions  Chaperone was present for exam:  Kari HERO, CMA  ASSESSMENT: Encounter for breast and pelvic exam.  Personal hx of other medical tx.  HSV 2. No outbreaks.  Not using medication.  FH breast cancer:  mother and sister.  Atrophy.  Encounter for medication monitoring.  No evidence of pelvic organ prolapse noted today. Osteoporosis.  On Prolia  through PCP.   PLAN: Mammogram screening discussed. Self breast awareness reviewed. Pap and HRV collected:  no. Due in 2027. Guidelines for Calcium, Vitamin D , regular exercise program including cardiovascular and weight bearing exercise. Medication refills:  Vagifem .  I discussed potential effect on breast cancer.  Referral for genetic counseling and testing.   Follow up:  yearly breast and pelvic and follow up.  Additional counseling given.  yes. 25 min  total time was spent for this patient encounter, including preparation, face-to-face counseling with the patient, coordination of care, and documentation of the encounter in addition to doing the breast and pelvic exam.

## 2024-06-22 NOTE — Patient Instructions (Signed)

## 2024-07-11 DIAGNOSIS — R300 Dysuria: Secondary | ICD-10-CM | POA: Diagnosis not present

## 2024-07-11 DIAGNOSIS — R319 Hematuria, unspecified: Secondary | ICD-10-CM | POA: Diagnosis not present

## 2024-07-11 DIAGNOSIS — N390 Urinary tract infection, site not specified: Secondary | ICD-10-CM | POA: Diagnosis not present

## 2024-07-18 ENCOUNTER — Telehealth (HOSPITAL_COMMUNITY): Payer: Self-pay | Admitting: Pharmacy Technician

## 2024-07-18 NOTE — Telephone Encounter (Signed)
 [facility] Patient Advocate Encounter   Patient resurfaced in referral work queue [facility]). Verified Medicare A/B and Tricare for Life are still active.         Desirey Keahey, CPhT [facility] Phone: [phone] 07/18/2024

## 2024-07-25 DIAGNOSIS — N3000 Acute cystitis without hematuria: Secondary | ICD-10-CM | POA: Diagnosis not present

## 2024-07-25 DIAGNOSIS — R300 Dysuria: Secondary | ICD-10-CM | POA: Diagnosis not present

## 2024-08-08 DIAGNOSIS — H02836 Dermatochalasis of left eye, unspecified eyelid: Secondary | ICD-10-CM | POA: Diagnosis not present

## 2024-08-08 DIAGNOSIS — Z961 Presence of intraocular lens: Secondary | ICD-10-CM | POA: Diagnosis not present

## 2024-08-08 DIAGNOSIS — H26493 Other secondary cataract, bilateral: Secondary | ICD-10-CM | POA: Diagnosis not present

## 2024-08-08 DIAGNOSIS — H02833 Dermatochalasis of right eye, unspecified eyelid: Secondary | ICD-10-CM | POA: Diagnosis not present

## 2024-08-11 ENCOUNTER — Other Ambulatory Visit: Payer: Self-pay | Admitting: Nurse Practitioner

## 2024-08-17 ENCOUNTER — Telehealth: Payer: Self-pay | Admitting: Genetic Counselor

## 2024-08-17 NOTE — Telephone Encounter (Signed)
 Rescheduled appointments per patients request via incoming call. Talked with the patient and she is aware of the changes made to her upcoming appointments.

## 2024-08-24 DIAGNOSIS — E039 Hypothyroidism, unspecified: Secondary | ICD-10-CM | POA: Diagnosis not present

## 2024-09-02 DIAGNOSIS — Z23 Encounter for immunization: Secondary | ICD-10-CM | POA: Diagnosis not present

## 2024-09-08 DIAGNOSIS — E039 Hypothyroidism, unspecified: Secondary | ICD-10-CM | POA: Diagnosis not present

## 2024-09-20 ENCOUNTER — Telehealth: Payer: Self-pay

## 2024-09-20 NOTE — Telephone Encounter (Unsigned)
 Copied from CRM #[id number]. Topic: General - Other >> Sep 20, 2024  4:29 PM Wess RAMAN wrote: Reason for CRM: Patient would like to received the covid 19 booster for over 72 years of age and needs a prescription  Callback #: [id number]  Pharmacy: [id number] - [place], [place] - [address] [place] [address] Phone: [phone] Fax: [phone] Hours: Not open 24 hours

## 2024-09-21 DIAGNOSIS — Z23 Encounter for immunization: Secondary | ICD-10-CM | POA: Diagnosis not present

## 2024-10-05 ENCOUNTER — Encounter: Admitting: Genetic Counselor

## 2024-10-05 ENCOUNTER — Other Ambulatory Visit

## 2024-10-05 DIAGNOSIS — H02835 Dermatochalasis of left lower eyelid: Secondary | ICD-10-CM | POA: Diagnosis not present

## 2024-10-05 DIAGNOSIS — H02834 Dermatochalasis of left upper eyelid: Secondary | ICD-10-CM | POA: Diagnosis not present

## 2024-10-05 DIAGNOSIS — H02831 Dermatochalasis of right upper eyelid: Secondary | ICD-10-CM | POA: Diagnosis not present

## 2024-10-05 DIAGNOSIS — H02832 Dermatochalasis of right lower eyelid: Secondary | ICD-10-CM | POA: Diagnosis not present

## 2024-10-11 DIAGNOSIS — R300 Dysuria: Secondary | ICD-10-CM | POA: Diagnosis not present

## 2024-10-11 DIAGNOSIS — N3000 Acute cystitis without hematuria: Secondary | ICD-10-CM | POA: Diagnosis not present

## 2024-10-26 ENCOUNTER — Inpatient Hospital Stay (HOSPITAL_BASED_OUTPATIENT_CLINIC_OR_DEPARTMENT_OTHER): Admitting: Genetic Counselor

## 2024-10-26 ENCOUNTER — Inpatient Hospital Stay: Attending: Internal Medicine

## 2024-10-26 DIAGNOSIS — Z808 Family history of malignant neoplasm of other organs or systems: Secondary | ICD-10-CM | POA: Diagnosis not present

## 2024-10-26 DIAGNOSIS — Z803 Family history of malignant neoplasm of breast: Secondary | ICD-10-CM | POA: Diagnosis not present

## 2024-10-26 DIAGNOSIS — Z1379 Encounter for other screening for genetic and chromosomal anomalies: Secondary | ICD-10-CM | POA: Diagnosis not present

## 2024-10-26 DIAGNOSIS — Z860101 Personal history of adenomatous and serrated colon polyps: Secondary | ICD-10-CM | POA: Diagnosis not present

## 2024-10-26 NOTE — Progress Notes (Signed)
 REFERRING PROVIDER: Cathlyn JAYSON Nikki Bobie*   PRIMARY PROVIDER:  Oris Camie BRAVO, NP  PRIMARY REASON FOR VISIT:  No diagnosis found.   HISTORY OF PRESENT ILLNESS:   Donna Ware, a 73 y.o. female, was seen for a [facility] genetics consultation at the request of Cathlyn JAYSON Nikki Bobie* due to a family history of cancer.  Donna Ware presents to clinic today to discuss the possibility of a hereditary predisposition to cancer, to discuss genetic testing, and to further clarify her future cancer risks, as well as potential cancer risks for family members.   Donna Ware is a 73 y.o. female with no personal history of cancer.    RELEVANT MEDICAL HISTORY:  Menarche was at age 44.  Nulliparous.  Ovaries intact: yes.  Uterus intact: yes.  Menopausal status: postmenopausal. Age 69 HRT use: 0 years. Colonoscopy: yes; 2015, two polyps. Mammogram within the last year: yes. Number of breast biopsies: 0.   Past Medical History:  Diagnosis Date   Anxiety state, unspecified    Barrett's esophagus    Cystocele, midline    Depressive disorder, not elsewhere classified    Encounter for long-term (current) use of other medications    Glycosuria    Insomnia, unspecified    Oral aphthae    Other and unspecified hyperlipidemia    Proteinuria    Reflux esophagitis    Rosacea    Senile osteoporosis    STD (sexually transmitted disease)    HSV2   Unspecified essential hypertension    Unspecified hypothyroidism    Viral hepatitis A without mention of hepatic coma     Past Surgical History:  Procedure Laterality Date   ABDOMINOPLASTY     BREAST ENHANCEMENT SURGERY  2010   CATARACT EXTRACTION Bilateral    01/2024   COSMETIC SURGERY  2009   for protruding ears   GANGLION CYST EXCISION  2011   right palm   teeth implants     tooth implant  8002,8001   tummy tuck      Social History   Socioeconomic History   Marital status: Legally Separated    Spouse name: Not on file    Number of children: Not on file   Years of education: Not on file   Highest education level: Not on file  Occupational History   Not on file  Tobacco Use   Smoking status: Never   Smokeless tobacco: Never  Vaping Use   Vaping status: Never Used  Substance and Sexual Activity   Alcohol use: Not Currently   Drug use: No   Sexual activity: Yes    Partners: Male    Birth control/protection: Post-menopausal    Comment: >5 partners, >55 y/o, HSV2  Other Topics Concern   Not on file  Social History Narrative   Separated   Significant other for 10 years    Social Drivers of Corporate Investment Banker Strain: Low Risk  (05/17/2024)   Overall Financial Resource Strain (CARDIA)    Difficulty of Paying Living Expenses: Not hard at all  Food Insecurity: No Food Insecurity (05/17/2024)   Hunger Vital Sign    Worried About Running Out of Food in the Last Year: Never true    Ran Out of Food in the Last Year: Never true  Transportation Needs: No Transportation Needs (05/17/2024)   PRAPARE - Administrator, Civil Service (Medical): No    Lack of Transportation (Non-Medical): No  Physical Activity: Sufficiently Active (05/17/2024)  Exercise Vital Sign    Days of Exercise per Week: 4 days    Minutes of Exercise per Session: 60 min  Stress: Stress Concern Present (05/17/2024)   [employer] of Occupational Health - Occupational Stress Questionnaire    Feeling of Stress : To some extent  Social Connections: Socially Isolated (05/17/2024)   Social Connection and Isolation Panel    Frequency of Communication with Friends and Family: Twice a week    Frequency of Social Gatherings with Friends and Family: Once a week    Attends Religious Services: Never    Database Administrator or Organizations: No    Attends Engineer, Structural: Never    Marital Status: Separated     FAMILY HISTORY:  We obtained a detailed, 4-generation family history.  Significant diagnoses are  listed below: Family History  Problem Relation Age of Onset   Breast cancer Mother    Heart attack Father    Heart disease Brother    Breast cancer Maternal Aunt    Heart attack Paternal Grandfather     Donna Ware is unaware of previous family history of genetic testing for hereditary cancer risks There is no reported Ashkenazi Jewish ancestry.    GENETIC COUNSELING ASSESSMENT: Donna Ware is a 73 y.o. female with a family history of cancer which is somewhat suggestive of a hereditary predisposition to cancer given the family history of six close maternal relatives diagnosed with breast cancer. We, therefore, discussed and recommended the following at today's visit.   DISCUSSION: We discussed that 5 - 10% of cancer is hereditary, with most cases of hereditary breast cancer associated with pathogenic variants in the BRCA1/2 genes. There are other genes that can be associated with hereditary breast cancer syndromes.  We discussed that testing is beneficial for several reasons, including knowing about other cancer risks, identifying potential screening and risk-reduction options that may be appropriate, and to understanding if other family members could be at risk for cancer and allowing them to undergo genetic testing.  We reviewed the characteristics, features and inheritance patterns of hereditary cancer syndromes. We also discussed genetic testing, including the appropriate family members to test, the process of testing, insurance coverage and turn-around-time for results. We discussed the implications of a negative, positive, carrier and/or variant of uncertain significant result. We discussed that negative results would be uninformative given that Donna Ware does not have a personal history of cancer. We recommended Donna Ware pursue genetic testing for a panel that contains genes associated with breast cancer.  Donna Ware was offered a common hereditary cancer panel (40+ genes) and an  expanded pan-cancer panel (70+ genes). Donna Ware was informed of the benefits and limitations of each panel, including that expanded pan-cancer panels contain several genes that do not have clear management guidelines at this point in time.  We also discussed that as the number of genes included on a panel increases, the chances of variants of uncertain significance increases.  After considering the benefits and limitations of each gene panel, Donna Ware elected to have [facility] CancerNext +RNAinsight panel. The [facility] CancerNext+RNAinsight Panel includes sequencing, rearrangement analysis, and RNA analysis for the following 40 genes: APC, ATM, BAP1, BARD1, BMPR1A, BRCA1, BRCA2, BRIP1, CDH1, CDKN2A, CHEK2, FH, FLCN, MET, MLH1, MSH2, MSH6, MUTYH, NF1, NTHL1, PALB2, PMS2, PTEN, RAD51C, RAD51D, RPS20, SMAD4, STK11, TP53, TSC1, TSC2 and VHL (sequencing and deletion/duplication); AXIN2, HOXB13, MBD4, MSH3, POLD1 and POLE (sequencing only); EPCAM and GREM1 (deletion/duplication only).     Based  on Donna Ware's family history of cancer, she meets medical criteria for genetic testing. Though Donna Ware is not personally affected, there are no affected family members that are willing/able to undergo hereditary cancer testing. Despite that she meets criteria, she may still have an out of pocket cost. We discussed that if her out of pocket cost for testing is over $100, the laboratory should contact them to discuss self-pay prices, patient pay assistance programs, if applicable, and other billing options. Self-pay options would be $250.00.   We discussed that some people do not want to undergo genetic testing due to fear of genetic discrimination.  A federal law called the Genetic Information Non-Discrimination Act (GINA) of 2008 helps protect individuals against genetic discrimination based on their genetic test results.  It impacts both health insurance and employment.  With health insurance, it protects against  increased premiums, being kicked off insurance or being forced to take a test in order to be insured.  For employment it protects against hiring, firing and promoting decisions based on genetic test results.  GINA does not apply to those in the [employer], those who work for companies with less than 15 employees, and new life insurance or long-term disability insurance policies.  Health status due to a cancer diagnosis is not protected under GINA.  PLAN: After considering the risks, benefits, and limitations, Donna Ware provided informed consent to pursue genetic testing and the blood sample was sent to [facility] for analysis of the CancerNext +RNAinsight panel. Results should be available within approximately 2-3 weeks' time, at which point they will be disclosed by telephone to Donna Ware, as will any additional recommendations warranted by these results. Donna Ware and her spouse will be out of the country 11/21/24-11/29/24, and asked that we call her spouse Alm at [phone] during that timeframe. Donna Ware will receive a summary of her genetic counseling visit and a copy of her results once available. This information will also be available in Epic.   Lastly, we encouraged Donna Ware to remain in contact with cancer genetics annually so that we can continuously update the family history and inform her of any changes in cancer genetics and testing that may be of benefit for this family.   Donna Ware questions were answered to her satisfaction today. Our contact information was provided should additional questions or concerns arise. Thank you for the referral and allowing us  to share in the care of your patient.   Burnard Ogren, MS, [facility] Licensed, [email or web address] phone: [phone]   55 minutes were spent on the date of the encounter in service to the patient including preparation, face-to-face consultation, documentation and care  coordination.  The patient brought her spouse Alm.    Drs. Gudena and/or Lanny were available to discuss this case as needed.  _______________________________________________________________________ For Office Staff:  Number of people involved in session: 2 Was an Intern/ student involved with case: no

## 2024-10-27 LAB — GENETIC SCREENING ORDER

## 2024-11-04 ENCOUNTER — Telehealth: Payer: Self-pay | Admitting: Genetic Counselor

## 2024-11-04 ENCOUNTER — Ambulatory Visit: Payer: Self-pay | Admitting: Genetic Counselor

## 2024-11-04 DIAGNOSIS — Z1231 Encounter for screening mammogram for malignant neoplasm of breast: Secondary | ICD-10-CM | POA: Diagnosis not present

## 2024-11-04 DIAGNOSIS — Z1379 Encounter for other screening for genetic and chromosomal anomalies: Secondary | ICD-10-CM

## 2024-11-04 DIAGNOSIS — M810 Age-related osteoporosis without current pathological fracture: Secondary | ICD-10-CM | POA: Diagnosis not present

## 2024-11-04 LAB — HM DEXA SCAN

## 2024-11-04 LAB — HM MAMMOGRAPHY

## 2024-11-04 NOTE — Telephone Encounter (Signed)
 I spoke to Donna Ware to review results of genetic testing, completed with [facility]Next-Expanded +RNA. Testing did not identify any variants known to increase the risk for cancer.  Discussed that we do not know why there is cancer in the family. It is possible that the cancers in history occurred by chance or due to environmental factors. It is possible there are family members that have a variant that she did not inherit. It is also possible there is a hereditary cause for cancer that cannot be identified with current technology or due to a gene that was not tested. It will be important for her to keep in contact with genetics to keep up with whether additional testing may be needed.  Discussed results of [name] and [name] breast cancer risk models. Ms. Bleecker plans to discuss these options with her PCP.   Please see counseling note for further detail on this result.

## 2024-11-05 ENCOUNTER — Encounter: Payer: Self-pay | Admitting: Nurse Practitioner

## 2024-11-05 DIAGNOSIS — Z1379 Encounter for other screening for genetic and chromosomal anomalies: Secondary | ICD-10-CM | POA: Insufficient documentation

## 2024-11-05 NOTE — Progress Notes (Signed)
 HPI:  [name] was previously seen in the [facility] due to a family history of cancer and concerns regarding a hereditary predisposition to cancer. Please refer to our prior cancer genetics clinic note for more information regarding our discussion, assessment and recommendations, at the time. [name] recent genetic test results were disclosed to her, as were recommendations warranted by these results. These results and recommendations are discussed in more detail below.  Results were disclosed via telephone on 11/04/24.   FAMILY HISTORY:  We obtained a detailed, 4-generation family history.  Significant diagnoses are listed below: Family History  Problem Relation Age of Onset   Breast cancer Mother    Heart attack Father    Heart disease Brother    Breast cancer Maternal Aunt    Heart attack Paternal Grandfather     [name] is unaware of previous family history of genetic testing for hereditary cancer risks There is no reported Ashkenazi Jewish ancestry.      GENETIC TEST RESULTS: Genetic testing reported out on 11/04/24 through the [facility] +RNAinsight panel found no pathogenic mutations. The [facility] CancerNext+RNAinsight Panel includes sequencing, rearrangement analysis, and RNA analysis for the following 40 genes: APC, ATM, BAP1, BARD1, BMPR1A, BRCA1, BRCA2, BRIP1, CDH1, CDKN2A, CHEK2, FH, FLCN, MET, MLH1, MSH2, MSH6, MUTYH, NF1, NTHL1, PALB2, PMS2, PTEN, RAD51C, RAD51D, RPS20, SMAD4, STK11, TP53, TSC1, TSC2 and VHL (sequencing and deletion/duplication); AXIN2, HOXB13, MBD4, MSH3, POLD1 and POLE (sequencing only); EPCAM and GREM1 (deletion/duplication only). RNA analysis included as available. The test report has been scanned into EPIC and is located under the Molecular Pathology section of the Results Review tab.  A portion of the result report is included below for reference.     We discussed with [name] that because current genetic testing  is not perfect, it is possible there may be a gene mutation in one of these genes that current testing cannot detect, but that chance is small.  We also discussed, that there could be another gene that has not yet been discovered, or that we have not yet tested, that is responsible for the cancer diagnoses in the family. It is also possible there is a hereditary cause for the cancer in the family that [name] did not inherit and therefore was not identified in her testing.  Therefore, it is important to remain in touch with cancer genetics in the future so that we can continue to offer [name] the most up to date genetic testing.   ADDITIONAL GENETIC TESTING: We discussed with [name] that there are other genes that are associated with increased cancer risk that can be analyzed. Should [name] wish to pursue additional genetic testing, we are happy to discuss and coordinate this testing, at any time.    CANCER SCREENING RECOMMENDATIONS: [name]'s test result is considered negative (normal).  This means that we have not identified a hereditary cause for her family history of cancer at this time. Most cancers happen by chance and this negative test suggests that her family history of cancer may fall into this category.    Possible reasons for [name]'s negative genetic test include:  1. There may be a gene mutation in one of these genes that current testing methods cannot detect but that chance is small.  2. There could be another gene that has not yet been discovered, or that we have not yet tested, that is responsible for the cancer diagnoses in the family.  3.  There may be no hereditary risk for cancer in the family. The cancers in [name] and/or her family may be sporadic/familial or due to other genetic and environmental factors. 4. It is also possible there is a hereditary cause for the cancer in the family that [name] did not inherit.  Therefore, it is recommended  she continue to follow the cancer management and screening guidelines provided by her primary healthcare provider. An individual's cancer risk and medical management are not determined by genetic test results alone. Overall cancer risk assessment incorporates additional factors, including personal medical history, family history, and any available genetic information that may result in a personalized plan for cancer prevention and surveillance  Given [name]'s family history, we must interpret these negative results with some caution.  Families with features suggestive of hereditary risk for cancer tend to have multiple family members with cancer, diagnoses in multiple generations and diagnoses before the age of 60. [name] family exhibits some of these features. Thus, this result may simply reflect our current inability to detect all mutations within these genes or there may be a different gene that has not yet been discovered or tested.   [name] may have an increased risk for breast cancer. The [name] model has calculated her 5 year risk for breast cancer to be 3.45%. Lifetime risk calculated by [name] was 11.03%. NCCN recommends that women with a 5-year risk for breast cancer over 1.7% per [name] model:  Clinical encounter every 6-12 months, to begin when increased risk is identified  Annual screening mammogram with tomosynthesis  To begin when identified as being at increased risk  Consider risk reduction strategies, such as medication to decrease the risk for breast cancer  Breast awareness   Offered a referral to a High Risk Breast Clinic (either at [facility] or [facility]) to discuss options for risk reduction in more detail. [name] declined at this time, plans to discuss with her PCP at an upcoming visit.   RECOMMENDATIONS FOR FAMILY MEMBERS:  Individuals in this family might be at some increased risk of developing cancer, over the general population risk, simply  due to the family history of cancer.  We recommended women in this family have a yearly mammogram beginning at age 1, or 9 years younger than the earliest onset of cancer, an annual clinical breast exam, and perform monthly breast self-exams. Women in this family should also have a gynecological exam as recommended by their primary provider. All family members should be referred for colonoscopy starting at age 13, or 61 years younger than the earliest onset of cancer.  It is also possible there is a hereditary cause for the cancer in [name]'s family that she did not inherit and therefore was not identified in her.  Based on [name]'s family history, we recommended her maternal cousin, who was diagnosed with breast cancer, have genetic counseling and testing. [name] will let us  know if we can be of any assistance in coordinating genetic counseling and/or testing for this family member.   FOLLOW-UP: Lastly, we discussed with [name] that cancer genetics is a rapidly advancing field and it is possible that new genetic tests will be appropriate for her and/or her family members in the future. We encouraged her to remain in contact with cancer genetics on an annual basis so we can update her personal and family histories and let her know of advances in cancer genetics that may benefit this  family.   Our contact number was provided. [name] questions were answered to her satisfaction, and she knows she is welcome to call us  at anytime with additional questions or concerns.   Burnard Ogren, MS, [facility] Licensed, [email or web address] [phone]

## 2024-11-07 ENCOUNTER — Encounter: Payer: Self-pay | Admitting: Nurse Practitioner

## 2024-11-07 ENCOUNTER — Ambulatory Visit

## 2024-11-07 VITALS — BP 135/82 | HR 69 | Temp 98.0°F | Resp 16 | Ht 65.0 in | Wt 145.6 lb

## 2024-11-07 DIAGNOSIS — M810 Age-related osteoporosis without current pathological fracture: Secondary | ICD-10-CM

## 2024-11-07 MED ORDER — DENOSUMAB 60 MG/ML ~~LOC~~ SOSY
60.0000 mg | PREFILLED_SYRINGE | Freq: Once | SUBCUTANEOUS | Status: AC
  Administered 2024-11-07: 60 mg via SUBCUTANEOUS

## 2024-11-07 NOTE — Progress Notes (Signed)
 Diagnosis: Osteoporosis  Provider:  Chilton Greathouse MD  Procedure: Injection  Prolia (Denosumab), Dose: 60 mg, Site: subcutaneous, Number of injections: 1  Injection Site(s): Right arm  Post Care:  right arm injection  Discharge: Condition: Good, Destination: Home . AVS Provided  Performed by:  Rico Ala, LPN

## 2024-11-08 ENCOUNTER — Other Ambulatory Visit: Payer: Self-pay | Admitting: Nurse Practitioner

## 2024-11-08 DIAGNOSIS — F419 Anxiety disorder, unspecified: Secondary | ICD-10-CM

## 2024-11-08 NOTE — Telephone Encounter (Signed)
 Last appt: 04/26/24

## 2024-11-14 ENCOUNTER — Ambulatory Visit: Payer: Self-pay | Admitting: Nurse Practitioner

## 2024-11-14 ENCOUNTER — Encounter: Payer: Self-pay | Admitting: Nurse Practitioner

## 2024-11-14 VITALS — BP 122/78 | HR 68 | Wt 140.2 lb

## 2024-11-14 DIAGNOSIS — D72819 Decreased white blood cell count, unspecified: Secondary | ICD-10-CM

## 2024-11-14 DIAGNOSIS — K2100 Gastro-esophageal reflux disease with esophagitis, without bleeding: Secondary | ICD-10-CM

## 2024-11-14 DIAGNOSIS — E559 Vitamin D deficiency, unspecified: Secondary | ICD-10-CM

## 2024-11-14 DIAGNOSIS — F419 Anxiety disorder, unspecified: Secondary | ICD-10-CM | POA: Diagnosis not present

## 2024-11-14 DIAGNOSIS — E039 Hypothyroidism, unspecified: Secondary | ICD-10-CM | POA: Diagnosis not present

## 2024-11-14 DIAGNOSIS — Z1211 Encounter for screening for malignant neoplasm of colon: Secondary | ICD-10-CM | POA: Diagnosis not present

## 2024-11-14 DIAGNOSIS — Z79890 Hormone replacement therapy (postmenopausal): Secondary | ICD-10-CM | POA: Diagnosis not present

## 2024-11-14 DIAGNOSIS — Z8639 Personal history of other endocrine, nutritional and metabolic disease: Secondary | ICD-10-CM | POA: Diagnosis not present

## 2024-11-14 DIAGNOSIS — M25512 Pain in left shoulder: Secondary | ICD-10-CM

## 2024-11-14 MED ORDER — LIOTHYRONINE SODIUM 5 MCG PO TABS: 5.0000 ug | ORAL_TABLET | Freq: Every day | ORAL | Status: AC

## 2024-11-14 MED ORDER — LEVOTHYROXINE SODIUM 100 MCG PO TABS: 100.0000 ug | ORAL_TABLET | Freq: Every day | ORAL | Status: DC

## 2024-11-14 MED ORDER — MELOXICAM 15 MG PO TABS: 15.0000 mg | ORAL_TABLET | Freq: Every day | ORAL | 3 refills | Status: AC

## 2024-11-14 NOTE — Progress Notes (Signed)
 Catheline Doing, DNP, AGNP-c [facility]  [address] [place], [place] [address] [phone]  ESTABLISHED PATIENT- Chronic Health and/or Follow-Up Visit on 11/14/2024  Blood pressure 122/78, pulse 68, weight 140 lb 3.2 oz (63.6 kg), last menstrual period 12/30/1999.   HPI: Dr. Geofm Riding- [facility] for surgery  Burnard Comber - genetic counseling for breast cancer risks  History of Present Illness Donna Ware is a 73 year old female who presents with left shoulder pain.  She has been experiencing left shoulder pain for approximately one month, which has worsened over the past week. The pain is described as a throbbing sensation during sleep and a 'zapping' sensation with certain movements. At rest the pain is reported high on the shoulder with a deep sensation. The pain radiates down the arm and is exacerbated by raising the arm above the head, but not by reaching behind her. No recent injury to the arm is reported.  Due to the shoulder pain, she has been avoiding yoga and weight lifting, particularly associating the pain with the use of two-pound weights. She has been sleeping on her back as lying on her side exacerbates the pain. She reports no paresthesia.  Her current medications include clonazepam , taken three times a day, and over-the-counter Unisom for sleep. She has discontinued trazodone  due to ineffectiveness. She has been on her clonazepam  regimen for many years and denies dizziness, falls, weakness, fatigue, or brain fog.   She also takes levothyroxine  and liothyronine for thyroid  management, though she is unsure of the current dosage. Omeprazole  is taken for reflux, which she resumed after experiencing symptoms upon discontinuation.  She has a history of cataract surgery with successful outcomes and is scheduled for eyelid surgery in January. She recently underwent genetic breast testing with negative results for concerning  genes and had a clear mammogram. She has received a Prolia  injection recently.  She is planning a two-week trip to [place], [place], and [place], and is concerned about managing her shoulder pain during the trip.   All ROS negative with exception of what is listed above.   PHYSICAL EXAM Physical Exam Vitals and nursing note reviewed.  Constitutional:      General: She is not in acute distress.    Appearance: Normal appearance. She is normal weight. She is not ill-appearing.  HENT:     Head: Normocephalic.  Eyes:     Pupils: Pupils are equal, round, and reactive to light.  Cardiovascular:     Rate and Rhythm: Normal rate and regular rhythm.     Pulses: Normal pulses.     Heart sounds: Normal heart sounds.  Pulmonary:     Effort: Pulmonary effort is normal.     Breath sounds: Normal breath sounds.  Abdominal:     General: Bowel sounds are normal. There is no distension.     Palpations: Abdomen is soft.     Tenderness: There is no abdominal tenderness. There is no guarding.  Musculoskeletal:        General: Tenderness present.     Right shoulder: Normal.     Left shoulder: Tenderness present. No bony tenderness or crepitus.       Arms:     Right lower leg: No edema.     Left lower leg: No edema.     Comments: Positive empty can present on the left.  Skin:    General: Skin is warm and dry.     Capillary Refill: Capillary refill takes less  than 2 seconds.  Neurological:     Mental Status: She is alert and oriented to person, place, and time.     Sensory: No sensory deficit.     Motor: No weakness.  Psychiatric:        Mood and Affect: Mood normal.        Behavior: Behavior normal.     PLAN Problem List Items Addressed This Visit     Hypothyroidism   Managed with levothyroxine  and liothyronine. Dosage adjustments are made by endocrinology based on regular blood tests. - Continue current thyroid  medication regimen as prescribed by endocrinology.      Relevant  Medications   liothyronine (CYTOMEL) 5 MCG tablet   Levothyroxine  Sodium 112 MCG CAPS   Reflux esophagitis   GERD is well-controlled with omeprazole . Symptoms recur if medication is reduced. - Continue omeprazole  as prescribed.      Hormone replacement therapy (HRT)   Vaginal estradiol  prescribed by GYN. No concerns at this time.       Anxiety   Anxiety is well-controlled with clonazepam  taken three times daily. Given patients advancing age, recommendations for cessation of benzodiazepines considered. Patient has been on this medication for many years with a taper from 8 tabs a day down to 3 in recent years. No concerning symptoms at this time. No reported fatigue, dizziness, falls, or memory impairment. Will continue to monitor and evaluate for ongoing therapy every 6 months. Recommend the smallest effective dose and attempt to reduce dosing from three times a day to twice a day, if possible.  - Continue clonazepam  as needed for anxiety up to three times daily. - Due to increased risks with this medication as you age, please try to continue to taper from 3 tablets a day down to 2 a day, if possible.       Acute pain of left shoulder - Primary   Symptoms of lateral shoulder pain on the left side that worsens with lateral raising of the arm. ROM is reduced and empty can test positive. Pain reportedly worse at night, causing sleep disturbance. Weakness perceived, but only mildly weaker than the right arm noted on exam. Suspect subacromial impingement based on symptoms and examination. Recommend gentle stretching and meloxicam for pain and inflammation. If no improvement with 2 weeks of consistent therapy, return to see Dr. Vita.       Relevant Medications   meloxicam (MOBIC) 15 MG tablet   Vitamin D  deficiency   Relevant Orders   VITAMIN D  25 Hydroxy (Vit-D Deficiency, Fractures)   Chronic leukopenia   Relevant Orders   CBC with Differential/Platelet   Comprehensive metabolic panel with  GFR   Other Visit Diagnoses       History of iron deficiency       Relevant Orders   CBC with Differential/Platelet     Screening for colon cancer       Relevant Orders   Ambulatory referral to Gastroenterology          Return in about 6 months (around 05/14/2025) for CPE.  SaraBeth Roquel Burgin, DNP, AGNP-c Time: 50 minutes, >50% spent counseling, care coordination, chart review, and documentation.  SABRAtime

## 2024-11-14 NOTE — Assessment & Plan Note (Signed)
 Managed with levothyroxine  and liothyronine. Dosage adjustments are made by endocrinology based on regular blood tests. - Continue current thyroid  medication regimen as prescribed by endocrinology.

## 2024-11-14 NOTE — Assessment & Plan Note (Signed)
 Vaginal estradiol  prescribed by GYN. No concerns at this time.

## 2024-11-14 NOTE — Assessment & Plan Note (Signed)
 Anxiety is well-controlled with clonazepam  taken three times daily. Given patients advancing age, recommendations for cessation of benzodiazepines considered. Patient has been on this medication for many years with a taper from 8 tabs a day down to 3 in recent years. No concerning symptoms at this time. No reported fatigue, dizziness, falls, or memory impairment. Will continue to monitor and evaluate for ongoing therapy every 6 months. Recommend the smallest effective dose and attempt to reduce dosing from three times a day to twice a day, if possible.  - Continue clonazepam  as needed for anxiety up to three times daily. - Due to increased risks with this medication as you age, please try to continue to taper from 3 tablets a day down to 2 a day, if possible.

## 2024-11-14 NOTE — Assessment & Plan Note (Signed)
 GERD is well-controlled with omeprazole . Symptoms recur if medication is reduced. - Continue omeprazole  as prescribed.

## 2024-11-14 NOTE — Patient Instructions (Addendum)
 I have sent a referral for a colonoscopy. I asked our referral coordinator to see if she can find a female provider for you.   Have fun on your trip!! Be safe and enjoy!!  If your shoulder isn't feeling better by the time you get back from your trip, call and ask to see Dr. Vita for your shoulder. He can ultrasound it and look to see if anything else needs to be done.

## 2024-11-14 NOTE — Assessment & Plan Note (Addendum)
 Symptoms of lateral shoulder pain on the left side that worsens with lateral raising of the arm. ROM is reduced and empty can test positive. Pain reportedly worse at night, causing sleep disturbance. Weakness perceived, but only mildly weaker than the right arm noted on exam. Suspect subacromial impingement based on symptoms and examination. Recommend gentle stretching and meloxicam for pain and inflammation. If no improvement with 2 weeks of consistent therapy, return to see Dr. Vita.

## 2024-11-15 LAB — COMPREHENSIVE METABOLIC PANEL WITH GFR
ALT: 14 IU/L (ref 0–32)
AST: 17 IU/L (ref 0–40)
Albumin: 4.9 g/dL — AB (ref 3.8–4.8)
Alkaline Phosphatase: 44 IU/L — AB (ref 49–135)
BUN/Creatinine Ratio: 16 (ref 12–28)
BUN: 14 mg/dL (ref 8–27)
Bilirubin Total: 0.3 mg/dL (ref 0.0–1.2)
CO2: 23 mmol/L (ref 20–29)
Calcium: 10 mg/dL (ref 8.7–10.3)
Chloride: 97 mmol/L (ref 96–106)
Creatinine, Ser: 0.86 mg/dL (ref 0.57–1.00)
Globulin, Total: 2.4 g/dL (ref 1.5–4.5)
Glucose: 102 mg/dL — AB (ref 70–99)
Potassium: 5.3 mmol/L — AB (ref 3.5–5.2)
Sodium: 133 mmol/L — AB (ref 134–144)
Total Protein: 7.3 g/dL (ref 6.0–8.5)
eGFR: 72 mL/min/1.73 (ref >59)

## 2024-11-15 LAB — CBC WITH DIFFERENTIAL/PLATELET
Basophils Absolute: 0 x10E3/uL (ref 0.0–0.2)
Basos: 0 %
EOS (ABSOLUTE): 0 x10E3/uL (ref 0.0–0.4)
Eos: 0 %
Hematocrit: 44.6 % (ref 34.0–46.6)
Hemoglobin: 14.7 g/dL (ref 11.1–15.9)
Immature Grans (Abs): 0 x10E3/uL (ref 0.0–0.1)
Immature Granulocytes: 0 %
Lymphocytes Absolute: 0.8 x10E3/uL (ref 0.7–3.1)
Lymphs: 18 %
MCH: 33.6 pg — ABNORMAL HIGH (ref 26.6–33.0)
MCHC: 33 g/dL (ref 31.5–35.7)
MCV: 102 fL — ABNORMAL HIGH (ref 79–97)
Monocytes Absolute: 0.4 x10E3/uL (ref 0.1–0.9)
Monocytes: 9 %
Neutrophils Absolute: 3.2 x10E3/uL (ref 1.4–7.0)
Neutrophils: 73 %
Platelets: 320 x10E3/uL (ref 150–450)
RBC: 4.38 x10E6/uL (ref 3.77–5.28)
RDW: 11.5 % — ABNORMAL LOW (ref 11.7–15.4)
WBC: 4.5 x10E3/uL (ref 3.4–10.8)

## 2024-11-15 LAB — VITAMIN D 25 HYDROXY (VIT D DEFICIENCY, FRACTURES): Vit D, 25-Hydroxy: 32.8 ng/mL (ref 30.0–100.0)

## 2024-11-16 ENCOUNTER — Ambulatory Visit: Payer: Self-pay | Admitting: Nurse Practitioner

## 2025-01-27 ENCOUNTER — Encounter: Payer: Self-pay | Admitting: Gastroenterology

## 2025-05-01 ENCOUNTER — Encounter: Payer: Self-pay | Admitting: Nurse Practitioner

## 2025-05-08 ENCOUNTER — Ambulatory Visit

## 2025-05-23 ENCOUNTER — Ambulatory Visit: Payer: Self-pay

## 2025-06-06 ENCOUNTER — Ambulatory Visit

## 2025-06-27 ENCOUNTER — Encounter: Admitting: Obstetrics and Gynecology
